# Patient Record
Sex: Female | Born: 1935 | Race: Black or African American | Hispanic: No | Smoking: Never smoker
Health system: Southern US, Community
[De-identification: ages and names within clinical notes are randomized; demographics above are authoritative.]

## PROBLEM LIST (undated history)

## (undated) DIAGNOSIS — E119 Type 2 diabetes mellitus without complications: Secondary | ICD-10-CM

## (undated) DIAGNOSIS — M1990 Unspecified osteoarthritis, unspecified site: Secondary | ICD-10-CM

## (undated) DIAGNOSIS — E669 Obesity, unspecified: Secondary | ICD-10-CM

## (undated) DIAGNOSIS — I10 Essential (primary) hypertension: Secondary | ICD-10-CM

## (undated) DIAGNOSIS — E559 Vitamin D deficiency, unspecified: Secondary | ICD-10-CM

## (undated) DIAGNOSIS — E785 Hyperlipidemia, unspecified: Secondary | ICD-10-CM

## (undated) DIAGNOSIS — R002 Palpitations: Secondary | ICD-10-CM

## (undated) DIAGNOSIS — R269 Unspecified abnormalities of gait and mobility: Secondary | ICD-10-CM

## (undated) HISTORY — DX: Vitamin D deficiency, unspecified: E55.9

## (undated) HISTORY — PX: ABDOMINAL HYSTERECTOMY: SHX81

## (undated) HISTORY — DX: Unspecified osteoarthritis, unspecified site: M19.90

## (undated) HISTORY — DX: Essential (primary) hypertension: I10

## (undated) HISTORY — DX: Hyperlipidemia, unspecified: E78.5

## (undated) HISTORY — DX: Type 2 diabetes mellitus without complications: E11.9

## (undated) HISTORY — DX: Palpitations: R00.2

## (undated) HISTORY — DX: Obesity, unspecified: E66.9

## (undated) HISTORY — DX: Unspecified abnormalities of gait and mobility: R26.9

## (undated) NOTE — *Deleted (*Deleted)
ED CM received consult from Dr Dalene Seltzer concerning patient has had 3 ED visits in the past months with multiple falls

---

## 1998-10-29 ENCOUNTER — Encounter: Payer: Self-pay | Admitting: Emergency Medicine

## 1998-10-30 ENCOUNTER — Inpatient Hospital Stay (HOSPITAL_COMMUNITY): Admission: EM | Admit: 1998-10-30 | Discharge: 1998-11-05 | Payer: Self-pay | Admitting: Emergency Medicine

## 1998-11-02 ENCOUNTER — Encounter: Payer: Self-pay | Admitting: Critical Care Medicine

## 1999-10-16 ENCOUNTER — Encounter: Payer: Self-pay | Admitting: Internal Medicine

## 1999-10-16 ENCOUNTER — Ambulatory Visit (HOSPITAL_COMMUNITY): Admission: RE | Admit: 1999-10-16 | Discharge: 1999-10-16 | Payer: Self-pay | Admitting: Internal Medicine

## 1999-10-16 ENCOUNTER — Encounter: Admission: RE | Admit: 1999-10-16 | Discharge: 1999-10-16 | Payer: Self-pay | Admitting: Internal Medicine

## 2002-05-07 ENCOUNTER — Encounter: Admission: RE | Admit: 2002-05-07 | Discharge: 2002-05-07 | Payer: Self-pay | Admitting: Internal Medicine

## 2002-05-07 ENCOUNTER — Encounter: Payer: Self-pay | Admitting: Internal Medicine

## 2004-07-09 ENCOUNTER — Inpatient Hospital Stay (HOSPITAL_COMMUNITY): Admission: AD | Admit: 2004-07-09 | Discharge: 2004-07-12 | Payer: Self-pay | Admitting: Internal Medicine

## 2005-10-10 ENCOUNTER — Emergency Department (HOSPITAL_COMMUNITY): Admission: EM | Admit: 2005-10-10 | Discharge: 2005-10-10 | Payer: Self-pay | Admitting: Emergency Medicine

## 2006-05-18 ENCOUNTER — Encounter: Admission: RE | Admit: 2006-05-18 | Discharge: 2006-05-18 | Payer: Self-pay | Admitting: Internal Medicine

## 2008-05-01 ENCOUNTER — Encounter: Admission: RE | Admit: 2008-05-01 | Discharge: 2008-05-01 | Payer: Self-pay | Admitting: Internal Medicine

## 2008-07-27 ENCOUNTER — Emergency Department (HOSPITAL_COMMUNITY): Admission: EM | Admit: 2008-07-27 | Discharge: 2008-07-27 | Payer: Self-pay | Admitting: Emergency Medicine

## 2009-06-10 ENCOUNTER — Encounter: Admission: RE | Admit: 2009-06-10 | Discharge: 2009-06-10 | Payer: Self-pay | Admitting: Internal Medicine

## 2010-05-31 ENCOUNTER — Encounter: Payer: Self-pay | Admitting: Internal Medicine

## 2010-07-02 ENCOUNTER — Other Ambulatory Visit: Payer: Self-pay | Admitting: Internal Medicine

## 2010-07-02 DIAGNOSIS — Z1231 Encounter for screening mammogram for malignant neoplasm of breast: Secondary | ICD-10-CM

## 2010-07-22 ENCOUNTER — Ambulatory Visit
Admission: RE | Admit: 2010-07-22 | Discharge: 2010-07-22 | Disposition: A | Discharge status: Home / Self Care | Payer: Medicare Other | Source: Ambulatory Visit | Attending: Internal Medicine | Admitting: Internal Medicine

## 2010-07-22 DIAGNOSIS — Z1231 Encounter for screening mammogram for malignant neoplasm of breast: Secondary | ICD-10-CM

## 2010-08-20 LAB — CBC
MCHC: 34.6 g/dL (ref 30.0–36.0)
MCV: 87.7 fL (ref 78.0–100.0)
Platelets: 251 10*3/uL (ref 150–400)
RBC: 3.68 MIL/uL — ABNORMAL LOW (ref 3.87–5.11)

## 2010-08-20 LAB — GLUCOSE, CAPILLARY: Glucose-Capillary: 227 mg/dL — ABNORMAL HIGH (ref 70–99)

## 2010-08-20 LAB — POCT I-STAT, CHEM 8
BUN: 45 mg/dL — ABNORMAL HIGH (ref 6–23)
Calcium, Ion: 1.18 mmol/L (ref 1.12–1.32)
Creatinine, Ser: 0.8 mg/dL (ref 0.4–1.2)
Hemoglobin: 11.9 g/dL — ABNORMAL LOW (ref 12.0–15.0)
Potassium: 3.6 mEq/L (ref 3.5–5.1)
TCO2: 25 mmol/L (ref 0–100)

## 2010-08-20 LAB — DIFFERENTIAL
Eosinophils Absolute: 0 10*3/uL (ref 0.0–0.7)
Eosinophils Relative: 0 % (ref 0–5)
Lymphocytes Relative: 9 % — ABNORMAL LOW (ref 12–46)
Neutro Abs: 6.4 10*3/uL (ref 1.7–7.7)
Neutrophils Relative %: 88 % — ABNORMAL HIGH (ref 43–77)

## 2010-08-20 LAB — URINALYSIS, ROUTINE W REFLEX MICROSCOPIC
Hgb urine dipstick: NEGATIVE
Ketones, ur: NEGATIVE mg/dL
Protein, ur: NEGATIVE mg/dL
Specific Gravity, Urine: 1.009 (ref 1.005–1.030)
Urobilinogen, UA: 0.2 mg/dL (ref 0.0–1.0)

## 2010-08-20 LAB — POCT CARDIAC MARKERS
CKMB, poc: 2.7 ng/mL (ref 1.0–8.0)
CKMB, poc: 3.4 ng/mL (ref 1.0–8.0)
Troponin i, poc: <0.05 ng/mL (ref 0.00–0.09)

## 2010-09-25 NOTE — Discharge Summary (Signed)
Carolyn Bishop, Carolyn Bishop                ACCOUNT NO.:  [id number]   MEDICAL RECORD NO.:  [id number]          PATIENT TYPE:  INP   LOCATION:  3731                         FACILITY:  [facility]   PHYSICIAN:  Lonia Blood, M.D.      DATE OF BIRTH:  12-Apr-1933   DATE OF ADMISSION:  07/09/2004  DATE OF DISCHARGE:  07/12/2004                                 DISCHARGE SUMMARY   PRIMARY CARE PHYSICIAN:  Robyn N. Allyne Gee, M.D.   DISCHARGE DIAGNOSES:  1.  Altered mental status changes.  2.  Uncontrolled diabetes.  3.  Chronic cough.  4.  Possible pneumonia.  5.  Hypertension.  6.  Mild insomnia.   DISCHARGE MEDICATIONS:  1.  Glipizide 10 mg p.o. b.i.d.  2.  Metformin 500 mg b.i.d.  3.  Maxzide 1 tablet q.d.  4.  Cozaar 50 mg q.d.  5.  Protonix 40 mg q.d.  6.  Celebrex 200 mg q.d.   FOLLOW UP:  The patient is to follow up with Dr. Candyce Churn. Sanders in one to  two weeks.   DISPOSITION:  The patient is currently in good health, not confused any  more. Her sister lives with her and husband. Instructed to observe the  patient closely, and if any other episode of confusion recurs, she will need  further evaluation preferably in case of dementia.   PROCEDURE:  1.  Head CT without contrast performed on July 10, 2004 showed mild atrophy      and small vessel disease without acute intracranial abnormality or      abnormal pulse control enhancement.  2.  Chest x-ray performed on July 10, 2004 essentially will not exclude      early basal infiltrate. Just mildly increased markings on both sides.  3.  MRI/MRA of the brain performed on July 11, 2004: This shows no acute      intracranial abnormality. Acute left maxillary sinus changes. Two mild      intracranial atherosclerotic changes with no significant stenosis. The      patient has a 9 by 30 mm polypoid lesion arising from the flow of the      right side of the sphenoid sinus, otherwise normal with an empty sellar.   CONSULTATIONS:  None.   BRIEF  HISTORY:  The patient is a 77 year old African American female with  longstanding hypertension, diabetes, and history of fibroids. The patient  was admitted on July 09, 2004 secondary to acute onset of altered mental  status changes. The patient was admitted directly with blood pressure of  150/64. CBGs as high as 300. She was seen by Dr. Candyce Churn. Sanders in her  office and was not in distress at the time. She was also noted to be alert  at the time of exam. However, she has had episodes of going in and out of  confusion. A sister, who lives with her, was worried that the patient was  getting confused repeatedly which is not her normal baseline. She was  subsequently sent over to the hospital for further workup. Her labs on  admission  showed a normal UA. Also CBC was essentially normal with white  count of 8, hemoglobin 12.4, and platelets 282,000. Her electrolytes also  were normal except for some sugar of 169 and a BUN of 24.   HOSPITAL COURSE:  Problem 1:  ALTERED MENTAL STATUS: This was thought to be  a case of acute delirium. The patient was for the most part alert but she  had a couple of episodes where she went into some elements of confusion.  Because of her history of diabetes, hypertension, and problems with  dyslipidemia, the patient was thought to be at high risk for CVA. Other  possible causes were probably dementia. However, her vascular causes were as  such and the following tests were ordered. Head CT was ordered as indicated  above, which was essentially negative. Cardiac enzymes were also checked to  rule out myocardial infarction as a possible causes. TSH was also normal at  1.012. Vitamin B12 was also checked and it was normal at 899. RPR was  checked and it was nonreactive. Her sedimentation rate was mildly elevated  at 53. An ammonia level was essentially normal.   The patient's urinalysis was also repeated and was essentially normal. She  was admitted into telemetry  and observed closely and her mental status  continued to change for the better throughout hospitalization. At the time  of discharge, the patient is essentially back to her baseline. At this  point, it is possible that the patient may have had the altered mental  status changes as a form of acute delirium due to acute disease. There is a  possibility that she may have had a pneumonia and the patient was  appropriately started on Avelox, which she has had for three days now.  Pneumonia is treated for at least five to seven days. Her sister has been  notified on patient that if confusion returns then consideration for  probably some elements of early onset dementia needs to be done and  appropriate treatment initiated.   Problem 2:  DIABETES:  The patient's sugars were continuously between 200  and 300. Apparently, they had gone as high as 500 at home recently. She was  on Glipizide alone before which has been changed to twice a day prior to  this admission. Despite taking Glipizide 10 mg b.i.d. in the hospital, her  sugars have been running from the 250s to 300s. Subsequently, we have  covered that with sliding scale insulin but at discharge we are putting the  patient on Metformin. I will start her on 500 mg b.i.d. to be adjusted as an  outpatient by Dr. Candyce Churn. Sanders. The patient has a meter at home to check  her sugars and her sister is there to help. She has two other sisters who  are also diabetic. So, the patient will be checking her CBGs at least three  times a day and then if the sugar is not appropriately controlled she will  let Dr. Candyce Churn. Allyne Gee know for further management.   Problem 3:  CHRONIC COUGH:  The patient has a dry, chronic cough that has  been going on for a while. She also described GERD-like symptoms with some  heartburn. Essentially, differential diagnoses include possibly ACE induced especially since she was taking Monopril. It could also be due to GERD.  We  initiated the patient on a PPI, Protonix daily and we switched half of  Monopril to an ARB at a course of 50 mg  daily. The patient's cough has  already virtually resolved now. We will therefore keep her on these  medicines for now.   Problem 4:  HYPERTENSION:  Her blood pressure is well controlled both on the  ACE and now on the ARB.   Problem 5:  INSOMNIA:  The patient has some bouts of insomnia in the  hospital secondary to where she was unable to sleep. We treated that with  some Ambien. However, she was not having problems at home. If that recurs,  she should call Dr. Candyce Churn. Sanders to see if she will be restarted on some  Ambien or other sleep medications.      LG/MEDQ  D:  07/12/2004  T:  07/13/2004  Job:  [id number]   cc:   Candyce Churn. Allyne Gee, M.D.  [address]  [address]  [place]  [place] [address]  Fax: [phone]

## 2011-01-28 ENCOUNTER — Encounter (INDEPENDENT_AMBULATORY_CARE_PROVIDER_SITE_OTHER): Payer: Medicare Other | Admitting: Ophthalmology

## 2011-01-28 DIAGNOSIS — E11359 Type 2 diabetes mellitus with proliferative diabetic retinopathy without macular edema: Secondary | ICD-10-CM

## 2011-01-28 DIAGNOSIS — H3581 Retinal edema: Secondary | ICD-10-CM

## 2011-01-28 DIAGNOSIS — H43819 Vitreous degeneration, unspecified eye: Secondary | ICD-10-CM

## 2011-02-11 ENCOUNTER — Encounter (INDEPENDENT_AMBULATORY_CARE_PROVIDER_SITE_OTHER): Payer: Medicare Other | Admitting: Ophthalmology

## 2011-02-11 DIAGNOSIS — H3581 Retinal edema: Secondary | ICD-10-CM

## 2011-02-24 ENCOUNTER — Encounter (INDEPENDENT_AMBULATORY_CARE_PROVIDER_SITE_OTHER): Payer: Medicare Other | Admitting: Ophthalmology

## 2011-02-24 DIAGNOSIS — H3581 Retinal edema: Secondary | ICD-10-CM

## 2011-03-17 ENCOUNTER — Encounter (INDEPENDENT_AMBULATORY_CARE_PROVIDER_SITE_OTHER): Payer: Medicare Other | Admitting: Ophthalmology

## 2011-03-17 DIAGNOSIS — E11359 Type 2 diabetes mellitus with proliferative diabetic retinopathy without macular edema: Secondary | ICD-10-CM

## 2011-03-17 DIAGNOSIS — E1165 Type 2 diabetes mellitus with hyperglycemia: Secondary | ICD-10-CM

## 2011-03-17 DIAGNOSIS — E1139 Type 2 diabetes mellitus with other diabetic ophthalmic complication: Secondary | ICD-10-CM

## 2011-03-31 ENCOUNTER — Encounter (INDEPENDENT_AMBULATORY_CARE_PROVIDER_SITE_OTHER): Payer: Medicare Other | Admitting: Ophthalmology

## 2011-03-31 DIAGNOSIS — E11359 Type 2 diabetes mellitus with proliferative diabetic retinopathy without macular edema: Secondary | ICD-10-CM

## 2011-03-31 DIAGNOSIS — E1139 Type 2 diabetes mellitus with other diabetic ophthalmic complication: Secondary | ICD-10-CM

## 2011-07-30 ENCOUNTER — Ambulatory Visit (INDEPENDENT_AMBULATORY_CARE_PROVIDER_SITE_OTHER): Payer: Medicare Other | Admitting: Ophthalmology

## 2011-08-09 ENCOUNTER — Other Ambulatory Visit: Payer: Self-pay | Admitting: Internal Medicine

## 2011-08-09 DIAGNOSIS — Z1231 Encounter for screening mammogram for malignant neoplasm of breast: Secondary | ICD-10-CM

## 2011-08-30 ENCOUNTER — Ambulatory Visit (INDEPENDENT_AMBULATORY_CARE_PROVIDER_SITE_OTHER): Payer: Medicare HMO | Admitting: Ophthalmology

## 2011-08-30 DIAGNOSIS — E11359 Type 2 diabetes mellitus with proliferative diabetic retinopathy without macular edema: Secondary | ICD-10-CM

## 2011-08-30 DIAGNOSIS — H43819 Vitreous degeneration, unspecified eye: Secondary | ICD-10-CM

## 2011-08-30 DIAGNOSIS — I10 Essential (primary) hypertension: Secondary | ICD-10-CM

## 2011-08-30 DIAGNOSIS — H35039 Hypertensive retinopathy, unspecified eye: Secondary | ICD-10-CM

## 2011-08-30 DIAGNOSIS — H2510 Age-related nuclear cataract, unspecified eye: Secondary | ICD-10-CM

## 2011-08-30 DIAGNOSIS — E1165 Type 2 diabetes mellitus with hyperglycemia: Secondary | ICD-10-CM

## 2011-09-14 ENCOUNTER — Ambulatory Visit
Admission: RE | Admit: 2011-09-14 | Discharge: 2011-09-14 | Disposition: A | Discharge status: Home / Self Care | Payer: Medicare HMO | Source: Ambulatory Visit | Attending: Internal Medicine | Admitting: Internal Medicine

## 2011-09-14 DIAGNOSIS — Z1231 Encounter for screening mammogram for malignant neoplasm of breast: Secondary | ICD-10-CM

## 2012-03-01 ENCOUNTER — Ambulatory Visit (INDEPENDENT_AMBULATORY_CARE_PROVIDER_SITE_OTHER): Payer: Medicare HMO | Admitting: Ophthalmology

## 2014-09-18 ENCOUNTER — Encounter: Payer: Self-pay | Admitting: *Deleted

## 2015-02-21 ENCOUNTER — Emergency Department (HOSPITAL_COMMUNITY)
Admission: EM | Admit: 2015-02-21 | Discharge: 2015-02-22 | Disposition: A | Discharge status: Home / Self Care | Payer: Medicare HMO | Attending: Emergency Medicine | Admitting: Emergency Medicine

## 2015-02-21 ENCOUNTER — Emergency Department (HOSPITAL_COMMUNITY): Payer: Medicare HMO

## 2015-02-21 ENCOUNTER — Encounter (HOSPITAL_COMMUNITY): Payer: Self-pay | Admitting: Nurse Practitioner

## 2015-02-21 DIAGNOSIS — Z79899 Other long term (current) drug therapy: Secondary | ICD-10-CM | POA: Insufficient documentation

## 2015-02-21 DIAGNOSIS — E119 Type 2 diabetes mellitus without complications: Secondary | ICD-10-CM | POA: Diagnosis not present

## 2015-02-21 DIAGNOSIS — R1110 Vomiting, unspecified: Secondary | ICD-10-CM | POA: Diagnosis not present

## 2015-02-21 DIAGNOSIS — R1031 Right lower quadrant pain: Secondary | ICD-10-CM | POA: Insufficient documentation

## 2015-02-21 DIAGNOSIS — R911 Solitary pulmonary nodule: Secondary | ICD-10-CM | POA: Diagnosis not present

## 2015-02-21 DIAGNOSIS — I10 Essential (primary) hypertension: Secondary | ICD-10-CM | POA: Diagnosis not present

## 2015-02-21 DIAGNOSIS — R109 Unspecified abdominal pain: Secondary | ICD-10-CM

## 2015-02-21 LAB — COMPREHENSIVE METABOLIC PANEL
ALT: 14 U/L (ref 14–54)
AST: 27 U/L (ref 15–41)
Albumin: 3.6 g/dL (ref 3.5–5.0)
Alkaline Phosphatase: 51 U/L (ref 38–126)
Anion gap: 14 (ref 5–15)
BUN: 44 mg/dL — ABNORMAL HIGH (ref 6–20)
CO2: 27 mmol/L (ref 22–32)
Calcium: 9.8 mg/dL (ref 8.9–10.3)
Chloride: 93 mmol/L — ABNORMAL LOW (ref 101–111)
Creatinine, Ser: 1 mg/dL (ref 0.44–1.00)
GFR calc Af Amer: >60 mL/min (ref >60)
GFR calc non Af Amer: 52 mL/min — ABNORMAL LOW (ref >60)
Glucose, Bld: 197 mg/dL — ABNORMAL HIGH (ref 65–99)
Potassium: 3.4 mmol/L — ABNORMAL LOW (ref 3.5–5.1)
Sodium: 134 mmol/L — ABNORMAL LOW (ref 135–145)
Total Bilirubin: 0.7 mg/dL (ref 0.3–1.2)
Total Protein: 7.3 g/dL (ref 6.5–8.1)

## 2015-02-21 LAB — CBC
HCT: 31.5 % — ABNORMAL LOW (ref 36.0–46.0)
Hemoglobin: 10.6 g/dL — ABNORMAL LOW (ref 12.0–15.0)
MCH: 29 pg (ref 26.0–34.0)
MCHC: 33.7 g/dL (ref 30.0–36.0)
MCV: 86.1 fL (ref 78.0–100.0)
Platelets: 251 10*3/uL (ref 150–400)
RBC: 3.66 MIL/uL — ABNORMAL LOW (ref 3.87–5.11)
RDW: 12.8 % (ref 11.5–15.5)
WBC: 5.8 10*3/uL (ref 4.0–10.5)

## 2015-02-21 MED ORDER — ONDANSETRON HCL 4 MG/2ML IJ SOLN
4.0000 mg | Freq: Once | INTRAMUSCULAR | Status: AC
  Administered 2015-02-21: 4 mg via INTRAVENOUS
  Filled 2015-02-21: qty 2

## 2015-02-21 MED ORDER — KETOROLAC TROMETHAMINE 30 MG/ML IJ SOLN
30.0000 mg | Freq: Once | INTRAMUSCULAR | Status: AC
  Administered 2015-02-21: 30 mg via INTRAVENOUS
  Filled 2015-02-21: qty 1

## 2015-02-21 MED ORDER — LACTATED RINGERS IV BOLUS (SEPSIS)
1000.0000 mL | Freq: Once | INTRAVENOUS | Status: AC
  Administered 2015-02-21: 1000 mL via INTRAVENOUS

## 2015-02-21 NOTE — ED Provider Notes (Signed)
CSN: [id number]     Arrival date & time 02/21/15  1824 History   First MD Initiated Contact with Patient 02/21/15 2210     Chief Complaint  Patient presents with  . Abdominal Pain     (Consider location/radiation/quality/duration/timing/severity/associated sxs/prior Treatment) HPI Comments: 81 year old female with past medical history including hypertension, diabetes, hyperlipidemia who presents with right flank pain. The patient states that she has been waking up with some lower blood sugars over the past several nights and last night woke up and noticed right flank pain radiating into her right side. The pain has been constant, moderate in intensity, and becomes severe with movement. She began having vomiting this morning and has had multiple episodes today. She reports that the pain begins in her right flank and radiates to her side and does not begin in her abdomen. She had constipation earlier this week but took laxatives and now has normal bowel movements. No pain with urination. No fevers, cough/cold symptoms, chest pain, or shortness of breath. No diarrhea.  Patient is a 81 y.o. female presenting with abdominal pain. The history is provided by the patient.  Abdominal Pain   Past Medical History  Diagnosis Date  . Diabetes (HCC)   . HLD (hyperlipidemia)   . HTN (hypertension)   . Palpitations    History reviewed. No pertinent past surgical history. History reviewed. No pertinent family history. Social History  Substance Use Topics  . Smoking status: Never Smoker   . Smokeless tobacco: Never Used  . Alcohol Use: No   OB History    No data available     Review of Systems  Gastrointestinal: Positive for abdominal pain.   10 Systems reviewed and are negative for acute change except as noted in the HPI.    Allergies  Review of patient's allergies indicates no known allergies.  Home Medications   Prior to Admission medications   Medication Sig Start Date End Date  Taking? Authorizing Provider  acetaminophen (TYLENOL) 500 MG tablet Take 1,000 mg by mouth daily as needed (pain).   Yes Historical Provider, MD  Calcium Carb-Cholecalciferol (CALCIUM 600 + D PO) Take 1,200 mg by mouth daily.   Yes Historical Provider, MD  furosemide (LASIX) 40 MG tablet Take 40 mg by mouth daily.   Yes Historical Provider, MD  glipiZIDE (GLUCOTROL) 5 MG tablet Take 5 mg by mouth 2 (two) times daily before a meal.   Yes Historical Provider, MD  losartan-hydrochlorothiazide (HYZAAR) 100-25 MG tablet Take 1 tablet by mouth daily.   Yes Historical Provider, MD  metFORMIN (GLUCOPHAGE) 500 MG tablet Take 500 mg by mouth 2 (two) times daily with a meal.   Yes Historical Provider, MD  metoprolol succinate (TOPROL-XL) 25 MG 24 hr tablet Take 25 mg by mouth daily before supper.   Yes Historical Provider, MD  Multiple Vitamin (MULTIVITAMIN WITH MINERALS) TABS tablet Take 1 tablet by mouth daily.   Yes Historical Provider, MD  naproxen sodium (ALEVE) 220 MG tablet Take 220 mg by mouth daily as needed (pain).   Yes Historical Provider, MD  potassium chloride SA (K-DUR,KLOR-CON) 20 MEQ tablet Take 20 mEq by mouth daily.   Yes Historical Provider, MD  senna-docusate (SENOKOT S) 8.6-50 MG tablet Take 1 tablet by mouth 2 (two) times daily as needed for mild constipation.   Yes Historical Provider, MD   BP 131/58 mmHg  Pulse 67  Temp(Src) 98.3 F (36.8 C) (Oral)  Resp 16  SpO2 96% Physical Exam  Constitutional: She  is oriented to person, place, and time. She appears well-developed and well-nourished. No distress.  HENT:  Head: Normocephalic and atraumatic.  Mouth/Throat: Oropharynx is clear and moist.  Moist mucous membranes  Eyes: Conjunctivae are normal. Pupils are equal, round, and reactive to light.  Neck: Neck supple.  Cardiovascular: Normal rate, regular rhythm and normal heart sounds.   No murmur heard. Pulmonary/Chest: Effort normal and breath sounds normal.  Abdominal: Soft.  Bowel sounds are normal. She exhibits no distension. There is no tenderness.  Genitourinary:  R CVA tenderness  Musculoskeletal: She exhibits no edema.  Neurological: She is alert and oriented to person, place, and time.  Fluent speech  Skin: Skin is warm and dry.  Psychiatric: She has a normal mood and affect. Judgment normal.  Nursing note and vitals reviewed.   ED Course  Procedures (including critical care time) Labs Review Labs Reviewed  COMPREHENSIVE METABOLIC PANEL - Abnormal; Notable for the following:    Sodium 134 (*)    Potassium 3.4 (*)    Chloride 93 (*)    Glucose, Bld 197 (*)    BUN 44 (*)    GFR calc non Af Amer 52 (*)    All other components within normal limits  CBC - Abnormal; Notable for the following:    RBC 3.66 (*)    Hemoglobin 10.6 (*)    HCT 31.5 (*)    All other components within normal limits  URINALYSIS, ROUTINE W REFLEX MICROSCOPIC (NOT AT [facility]) - Abnormal; Notable for the following:    APPearance CLOUDY (*)    All other components within normal limits    Imaging Review Ct Abdomen Pelvis Wo Contrast  02/21/2015  CLINICAL DATA:  Right flank pain, nausea, vomiting, constipation EXAM: CT ABDOMEN AND PELVIS WITHOUT CONTRAST TECHNIQUE: Multidetector CT imaging of the abdomen and pelvis was performed following the standard protocol without IV contrast. COMPARISON:  None. FINDINGS: Sagittal images of the spine shows degenerative changes lumbar spine. Lung bases shows a pleural based low-density nodule in right base centrally measures 1.3 cm. There is dextroscoliosis of the lumbar spine. Atherosclerotic calcifications of abdominal aorta and iliac arteries. Small hiatal hernia. Moderate stool noted throughout the colon. Normal appendix. No pericecal inflammation. There is a supraumbilical midline ventral hernia containing fat without evidence of acute complication measures 2.7 cm. Atherosclerotic calcifications of abdominal aorta and iliac arteries. No aortic  aneurysm. The terminal ileum is unremarkable. No small bowel obstruction. No ascites or free air. No adenopathy. Urinary bladder is unremarkable. The uterus is not identified. Unenhanced kidneys are symmetrical in size. There is no nephrolithiasis. No hydronephrosis or hydroureter. There is a hyperdense cortical lesion midpole of the left kidney measures 1.2 cm. Further correlation with enhanced CT or MRI is recommended. No calcified calculi are noted within urinary bladder. Bilateral distal ureter is unremarkable. IMPRESSION: 1. There is a central diaphragmatic based nodule in left lower lobe measures 1.3 cm. Further correlation with enhanced CT scan of the chest is recommended. 2. Small hiatal hernia. 3. No nephrolithiasis.  No hydronephrosis or hydroureter. 4. Degenerative changes lumbar spine. 5. Moderate stool throughout the colon. Normal appendix. No pericecal inflammation. 6. Midline supraumbilical ventral hernia containing fat without evidence of acute complication. 7. No calcified ureteral calculi. No calcified calculi are noted within urinary bladder. 8. No small bowel obstruction. Electronically Signed   By: Natasha Mead M.D.   On: 02/21/2015 23:34   I have personally reviewed and evaluated these lab results as part of my  medical decision-making.   EKG Interpretation None     Medications  lactated ringers bolus 1,000 mL (1,000 mLs Intravenous New Bag/Given 02/21/15 2248)  ondansetron (ZOFRAN) injection 4 mg (4 mg Intravenous Given 02/21/15 2248)  ketorolac (TORADOL) 30 MG/ML injection 30 mg (30 mg Intravenous Given 02/21/15 2342)    MDM   Final diagnoses:  Right flank pain  Lung nodule seen on imaging study    81 year old female who presents with 1 day of right flank radiating into right side pain associated with several episodes of vomiting. Patient well-appearing at presentation with normal vital signs. No right lower quadrant tenderness on exam. She did have right CVA tenderness.  Obtained above labs which showed mild hyperglycemia but were otherwise unremarkable. Stable anemia with hemoglobin 10.6. Because of the patient's flank pain associated with vomiting, obtained a CT of the abdomen and pelvis to evaluate for renal stone or other acute process. CT showed no acute findings to explain the patient's symptoms. She had moderate stool burden and left lower lobe nodule with CT of chest recommended. I have informed the patient of this incidental finding and emphasized importance of follow-up with PCP for further imaging. Patient has voiced understanding of follow-up plan as well as return precautions. Patient discharged in satisfactory condition.  Laurence Spatesachel Morgan Calyse [place], MD 02/22/15 [phone]

## 2015-02-21 NOTE — ED Notes (Signed)
She c/o waking past several night with low blood sugar, last night when she woke she noticed RLQ abd pain radiating into R flank. Pain has persisted since. Then onset vomiting since waking this am. she reports some constipation earlier this week that was relieved after taking a laxative.

## 2015-02-22 LAB — URINALYSIS, ROUTINE W REFLEX MICROSCOPIC
BILIRUBIN URINE: NEGATIVE
Glucose, UA: NEGATIVE mg/dL
HGB URINE DIPSTICK: NEGATIVE
KETONES UR: NEGATIVE mg/dL
Leukocytes, UA: NEGATIVE
Nitrite: NEGATIVE
Protein, ur: NEGATIVE mg/dL
SPECIFIC GRAVITY, URINE: 1.014 (ref 1.005–1.030)
UROBILINOGEN UA: 0.2 mg/dL (ref 0.0–1.0)
pH: 7 (ref 5.0–8.0)

## 2015-02-22 NOTE — Discharge Instructions (Signed)
PLEASE FOLLOW UP WITH YOUR PRIMARY CARE PROVIDER TO HAVE A CONTRASTED CT SCAN OF YOUR CHEST TO EVALUATE A LEFT LUNG NODULE. Flank Pain Flank pain refers to pain that is located on the side of the body between the upper abdomen and the back. The pain may occur over a short period of time (acute) or may be long-term or reoccurring (chronic). It may be mild or severe. Flank pain can be caused by many things. CAUSES  Some of the more common causes of flank pain include:  Muscle strains.   Muscle spasms.   A disease of your spine (vertebral disk disease).   A lung infection (pneumonia).   Fluid around your lungs (pulmonary edema).   A kidney infection.   Kidney stones.   A very painful skin rash caused by the chickenpox virus (shingles).   Gallbladder disease.  HOME CARE INSTRUCTIONS  Home care will depend on the cause of your pain. In general,  Rest as directed by your caregiver.  Drink enough fluids to keep your urine clear or pale yellow.  Only take over-the-counter or prescription medicines as directed by your caregiver. Some medicines may help relieve the pain.  Tell your caregiver about any changes in your pain.  Follow up with your caregiver as directed. SEEK IMMEDIATE MEDICAL CARE IF:   Your pain is not controlled with medicine.   You have new or worsening symptoms.  Your pain increases.   You have abdominal pain.   You have shortness of breath.   You have persistent nausea or vomiting.   You have swelling in your abdomen.   You feel faint or pass out.   You have blood in your urine.  You have a fever or persistent symptoms for more than 2-3 days.  You have a fever and your symptoms suddenly get worse. MAKE SURE YOU:   Understand these instructions.  Will watch your condition.  Will get help right away if you are not doing well or get worse.   This information is not intended to replace advice given to you by your health care  provider. Make sure you discuss any questions you have with your health care provider.   Document Released: 06/17/2005 Document Revised: 01/19/2012 Document Reviewed: 12/09/2011 Elsevier Interactive Patient Education [employer]2016 [employer]

## 2016-08-17 LAB — CBC AND DIFFERENTIAL
HCT: 32 — AB (ref 36–46)
Hemoglobin: 10.2 — AB (ref 12.0–16.0)
Platelets: 238 (ref 150–399)
WBC: 5.3

## 2016-08-17 LAB — HEPATIC FUNCTION PANEL
ALT: 9 (ref 7–35)
AST: 17 (ref 13–35)
Alkaline Phosphatase: 80 (ref 25–125)
Bilirubin, Total: 0.3

## 2016-08-17 LAB — TSH: TSH: 0.77 (ref 0.41–5.90)

## 2016-08-17 LAB — BASIC METABOLIC PANEL
BUN: 54 — AB (ref 4–21)
CO2: 26 — AB (ref 13–22)
Chloride: 97 — AB (ref 99–108)
Creatinine: 1 (ref 0.5–1.1)
Glucose: 70
Potassium: 3.6 (ref 3.4–5.3)
Sodium: 141 (ref 137–147)

## 2016-08-17 LAB — LIPID PANEL
Cholesterol: 155 (ref 0–200)
HDL: 47 (ref 35–70)
LDL Cholesterol: 93
Triglycerides: 76 (ref 40–160)

## 2016-08-17 LAB — COMPREHENSIVE METABOLIC PANEL
Albumin: 4.1 (ref 3.5–5.0)
Calcium: 10 (ref 8.7–10.7)
GFR calc Af Amer: 63
GFR calc non Af Amer: 55
Globulin: 3.4

## 2016-08-17 LAB — CBC: RBC: 3.63 — AB (ref 3.87–5.11)

## 2016-08-17 LAB — HEMOGLOBIN A1C: Hemoglobin A1C: 5.5

## 2016-08-17 LAB — VITAMIN D 25 HYDROXY (VIT D DEFICIENCY, FRACTURES): Vit D, 25-Hydroxy: 52.5

## 2016-08-17 LAB — MICROALBUMIN, URINE: Microalb, Ur: ABNORMAL

## 2016-08-23 ENCOUNTER — Other Ambulatory Visit: Payer: Self-pay | Admitting: Internal Medicine

## 2016-08-23 DIAGNOSIS — Z1231 Encounter for screening mammogram for malignant neoplasm of breast: Secondary | ICD-10-CM

## 2017-10-27 ENCOUNTER — Emergency Department (HOSPITAL_BASED_OUTPATIENT_CLINIC_OR_DEPARTMENT_OTHER): Payer: Medicare HMO

## 2017-10-27 ENCOUNTER — Other Ambulatory Visit: Payer: Self-pay

## 2017-10-27 ENCOUNTER — Encounter: Payer: Self-pay | Admitting: Emergency Medicine

## 2017-10-27 ENCOUNTER — Inpatient Hospital Stay (HOSPITAL_BASED_OUTPATIENT_CLINIC_OR_DEPARTMENT_OTHER)
Admission: EM | Admit: 2017-10-27 | Discharge: 2017-10-31 | DRG: 682 | Disposition: A | Discharge status: Home / Self Care | Payer: Medicare HMO | Attending: Family Medicine | Admitting: Family Medicine

## 2017-10-27 DIAGNOSIS — R911 Solitary pulmonary nodule: Secondary | ICD-10-CM | POA: Diagnosis present

## 2017-10-27 DIAGNOSIS — R339 Retention of urine, unspecified: Secondary | ICD-10-CM | POA: Diagnosis present

## 2017-10-27 DIAGNOSIS — E872 Acidosis, unspecified: Secondary | ICD-10-CM | POA: Diagnosis present

## 2017-10-27 DIAGNOSIS — K254 Chronic or unspecified gastric ulcer with hemorrhage: Secondary | ICD-10-CM | POA: Diagnosis present

## 2017-10-27 DIAGNOSIS — K922 Gastrointestinal hemorrhage, unspecified: Secondary | ICD-10-CM | POA: Diagnosis not present

## 2017-10-27 DIAGNOSIS — E46 Unspecified protein-calorie malnutrition: Secondary | ICD-10-CM | POA: Diagnosis present

## 2017-10-27 DIAGNOSIS — E785 Hyperlipidemia, unspecified: Secondary | ICD-10-CM | POA: Diagnosis present

## 2017-10-27 DIAGNOSIS — N179 Acute kidney failure, unspecified: Principal | ICD-10-CM | POA: Diagnosis present

## 2017-10-27 DIAGNOSIS — I959 Hypotension, unspecified: Secondary | ICD-10-CM

## 2017-10-27 DIAGNOSIS — Z90710 Acquired absence of both cervix and uterus: Secondary | ICD-10-CM

## 2017-10-27 DIAGNOSIS — I10 Essential (primary) hypertension: Secondary | ICD-10-CM | POA: Diagnosis present

## 2017-10-27 DIAGNOSIS — R29898 Other symptoms and signs involving the musculoskeletal system: Secondary | ICD-10-CM

## 2017-10-27 DIAGNOSIS — R918 Other nonspecific abnormal finding of lung field: Secondary | ICD-10-CM | POA: Diagnosis present

## 2017-10-27 DIAGNOSIS — Z79899 Other long term (current) drug therapy: Secondary | ICD-10-CM | POA: Diagnosis not present

## 2017-10-27 DIAGNOSIS — Z6821 Body mass index (BMI) 21.0-21.9, adult: Secondary | ICD-10-CM

## 2017-10-27 DIAGNOSIS — R571 Hypovolemic shock: Secondary | ICD-10-CM | POA: Diagnosis present

## 2017-10-27 DIAGNOSIS — R338 Other retention of urine: Secondary | ICD-10-CM

## 2017-10-27 DIAGNOSIS — I5030 Unspecified diastolic (congestive) heart failure: Secondary | ICD-10-CM | POA: Diagnosis not present

## 2017-10-27 DIAGNOSIS — Z7984 Long term (current) use of oral hypoglycemic drugs: Secondary | ICD-10-CM

## 2017-10-27 DIAGNOSIS — E162 Hypoglycemia, unspecified: Secondary | ICD-10-CM | POA: Diagnosis present

## 2017-10-27 DIAGNOSIS — E119 Type 2 diabetes mellitus without complications: Secondary | ICD-10-CM | POA: Diagnosis not present

## 2017-10-27 DIAGNOSIS — E11649 Type 2 diabetes mellitus with hypoglycemia without coma: Secondary | ICD-10-CM | POA: Diagnosis present

## 2017-10-27 DIAGNOSIS — E1151 Type 2 diabetes mellitus with diabetic peripheral angiopathy without gangrene: Secondary | ICD-10-CM | POA: Diagnosis present

## 2017-10-27 DIAGNOSIS — G9340 Encephalopathy, unspecified: Secondary | ICD-10-CM | POA: Diagnosis not present

## 2017-10-27 DIAGNOSIS — D649 Anemia, unspecified: Secondary | ICD-10-CM | POA: Diagnosis not present

## 2017-10-27 DIAGNOSIS — R7303 Prediabetes: Secondary | ICD-10-CM

## 2017-10-27 LAB — URINALYSIS, ROUTINE W REFLEX MICROSCOPIC
BILIRUBIN URINE: NEGATIVE
Glucose, UA: NEGATIVE mg/dL
Hgb urine dipstick: NEGATIVE
KETONES UR: NEGATIVE mg/dL
LEUKOCYTES UA: NEGATIVE
Nitrite: NEGATIVE
PROTEIN: NEGATIVE mg/dL
Specific Gravity, Urine: 1.01 (ref 1.005–1.030)
pH: 5.5 (ref 5.0–8.0)

## 2017-10-27 LAB — COMPREHENSIVE METABOLIC PANEL
ALK PHOS: 45 U/L (ref 38–126)
ALT: 14 U/L (ref 14–54)
ANION GAP: 15 (ref 5–15)
AST: 27 U/L (ref 15–41)
Albumin: 4 g/dL (ref 3.5–5.0)
BILIRUBIN TOTAL: 0.3 mg/dL (ref 0.3–1.2)
BUN: 162 mg/dL — ABNORMAL HIGH (ref 6–20)
CALCIUM: 9.7 mg/dL (ref 8.9–10.3)
CO2: 26 mmol/L (ref 22–32)
CREATININE: 3.79 mg/dL — AB (ref 0.44–1.00)
Chloride: 93 mmol/L — ABNORMAL LOW (ref 101–111)
GFR, EST AFRICAN AMERICAN: 12 mL/min — AB (ref >60)
GFR, EST NON AFRICAN AMERICAN: 10 mL/min — AB (ref >60)
Glucose, Bld: 38 mg/dL — CL (ref 65–99)
Potassium: 4.7 mmol/L (ref 3.5–5.1)
SODIUM: 134 mmol/L — AB (ref 135–145)
TOTAL PROTEIN: 7.8 g/dL (ref 6.5–8.1)

## 2017-10-27 LAB — IRON AND TIBC
IRON: 51 ug/dL (ref 28–170)
SATURATION RATIOS: 15 % (ref 10.4–31.8)
TIBC: 337 ug/dL (ref 250–450)
UIBC: 286 ug/dL

## 2017-10-27 LAB — CBC
HCT: 25.9 % — ABNORMAL LOW (ref 36.0–46.0)
HEMOGLOBIN: 8.8 g/dL — AB (ref 12.0–15.0)
MCH: 30.6 pg (ref 26.0–34.0)
MCHC: 34 g/dL (ref 30.0–36.0)
MCV: 89.9 fL (ref 78.0–100.0)
Platelets: 216 10*3/uL (ref 150–400)
RBC: 2.88 MIL/uL — ABNORMAL LOW (ref 3.87–5.11)
RDW: 12.3 % (ref 11.5–15.5)
WBC: 4.6 10*3/uL (ref 4.0–10.5)

## 2017-10-27 LAB — CBG MONITORING, ED
GLUCOSE-CAPILLARY: 42 mg/dL — AB (ref 65–99)
Glucose-Capillary: 138 mg/dL — ABNORMAL HIGH (ref 65–99)
Glucose-Capillary: 27 mg/dL — CL (ref 65–99)
Glucose-Capillary: 79 mg/dL (ref 65–99)

## 2017-10-27 LAB — FERRITIN: FERRITIN: 69 ng/mL (ref 11–307)

## 2017-10-27 LAB — RETICULOCYTES
RBC.: 2.96 MIL/uL — ABNORMAL LOW (ref 3.87–5.11)
RETIC CT PCT: 1.1 % (ref 0.4–3.1)
Retic Count, Absolute: 32.6 10*3/uL (ref 19.0–186.0)

## 2017-10-27 LAB — VITAMIN B12: Vitamin B-12: 295 pg/mL (ref 180–914)

## 2017-10-27 LAB — I-STAT CG4 LACTIC ACID, ED
Lactic Acid, Venous: 1.81 mmol/L (ref 0.5–1.9)
Lactic Acid, Venous: 2.22 mmol/L (ref 0.5–1.9)

## 2017-10-27 LAB — FOLATE: Folate: 13 ng/mL (ref >5.9)

## 2017-10-27 LAB — OCCULT BLOOD X 1 CARD TO LAB, STOOL: FECAL OCCULT BLD: POSITIVE — AB

## 2017-10-27 MED ORDER — DEXTROSE 50 % IV SOLN: 50.0000 mL | INTRAVENOUS | Status: DC | PRN

## 2017-10-27 MED ORDER — SODIUM CHLORIDE 0.9 % IV BOLUS
1000.0000 mL | Freq: Once | INTRAVENOUS | Status: AC
  Administered 2017-10-27: 1000 mL via INTRAVENOUS

## 2017-10-27 MED ORDER — DEXTROSE 10 % IV SOLN
INTRAVENOUS | Status: DC
  Administered 2017-10-27: 23:00:00 via INTRAVENOUS

## 2017-10-27 MED ORDER — DEXTROSE 50 % IV SOLN
1.0000 | Freq: Once | INTRAVENOUS | Status: AC
  Administered 2017-10-27: 50 mL via INTRAVENOUS

## 2017-10-27 MED ORDER — DEXTROSE 50 % IV SOLN
INTRAVENOUS | Status: AC
  Filled 2017-10-27: qty 50

## 2017-10-27 MED ORDER — HYDROCORTISONE NA SUCCINATE PF 100 MG IJ SOLR
100.0000 mg | Freq: Once | INTRAMUSCULAR | Status: AC
  Administered 2017-10-27: 100 mg via INTRAVENOUS
  Filled 2017-10-27: qty 2

## 2017-10-27 MED ORDER — DEXTROSE 50 % IV SOLN
INTRAVENOUS | Status: AC
Start: 2017-10-27 — End: 2017-10-28
  Filled 2017-10-27: qty 50

## 2017-10-27 NOTE — ED Notes (Signed)
Pt on monitor 

## 2017-10-27 NOTE — ED Notes (Addendum)
Patient transported to X-ray 

## 2017-10-27 NOTE — ED Notes (Signed)
[facility] arrived to transport pt 

## 2017-10-27 NOTE — H&P (Signed)
History and Physical    Carolyn Bishop ZOX:[id number] DOB: [birth year] DOA: 10/27/2017  Referring MD/NP/PA:   PCP: Andi Devon, MD   Patient coming from:  The patient is coming from home.  At baseline, pt is independent for most of ADL.  Chief Complaint: Generalized weakness and intermittent left arm weakness  HPI: Carolyn Bishop is a 84 y.o. female with medical history significant of hypertension, hyperlipidemia, diabetes mellitus, who presents with generalized weakness and intermittent left arm weakness.  Patient states that she has been having generalized weakness in the past 3 days.  She has decreased energy and poor appetite.  She also reports intermittent left arm weakness, but no tingling in extremities.  No facial droop or slurred speech.  Patient denies any chest pain, shortness of breath, cough.  No fever or chills.  Denies nausea, vomiting, diarrhea, abdominal pain.  No symptoms of UTI.  No dark stool or hematuria.  She has mild suprapubic abdominal pain, found to have 600 ml of urinary retention on bladder scan ED.  Foley catheter is placed.  ED Course: pt was found to have positive FOBT, hemoglobin 8.8 which was 10.6 on 02/21/2015, AKI with creatinine 3.79, BUN was 62, negative urinalysis, hypoglycemia with blood sugar 38 which is treated with D50 and D10 infusion.  Temperature normal, no tachycardia, no tachypnea, oxygen saturation 100% on room air.  Chest x-ray has no infiltration, but showed increased size of left lower lobe mass.  Patient is admitted to stepdown as inpatient.  Review of Systems:   General: no fevers, chills, no body weight gain, has poor appetite, has fatigue HEENT: no blurry vision, hearing changes or sore throat Respiratory: no dyspnea, coughing, wheezing CV: no chest pain, no palpitations GI: no nausea, vomiting, had mild suprapubic abdominal pain, diarrhea, constipation GU: no dysuria, burning on urination, increased urinary frequency, hematuria  Ext:  no leg edema Neuro: has left arm weakness, no facial droop or slurred speech Skin: no rash, no skin tear. MSK: No muscle spasm, no deformity, no limitation of range of movement in spin Heme: No easy bruising.  Travel history: No recent long distant travel.  Allergy: No Known Allergies  Past Medical History:  Diagnosis Date  . Diabetes (HCC)   . HLD (hyperlipidemia)   . HTN (hypertension)   . Palpitations     Past Surgical History:  Procedure Laterality Date  . ABDOMINAL HYSTERECTOMY      Social History:  reports that she has never smoked. She has never used smokeless tobacco. She reports that she does not drink alcohol or use drugs.  Family History:  Family History  Problem Relation Age of Onset  . Hypertension Mother   . Lung cancer Father      Prior to Admission medications   Medication Sig Start Date End Date Taking? Authorizing Provider  acetaminophen (TYLENOL) 500 MG tablet Take 1,000 mg by mouth daily as needed (pain).    [provider]  Calcium Carb-Cholecalciferol (CALCIUM 600 + D PO) Take 1,200 mg by mouth daily.    [provider]  furosemide (LASIX) 40 MG tablet Take 40 mg by mouth daily.    [provider]  glipiZIDE (GLUCOTROL) 5 MG tablet Take 5 mg by mouth 2 (two) times daily before a meal.    [provider]  losartan-hydrochlorothiazide (HYZAAR) 100-25 MG tablet Take 1 tablet by mouth daily.    [provider]  metFORMIN (GLUCOPHAGE) 500 MG tablet Take 500 mg by mouth 2 (two) times daily  with a meal.    [provider]  metoprolol succinate (TOPROL-XL) 25 MG 24 hr tablet Take 25 mg by mouth daily before supper.    [provider]  Multiple Vitamin (MULTIVITAMIN WITH MINERALS) TABS tablet Take 1 tablet by mouth daily.    [provider]  naproxen sodium (ALEVE) 220 MG tablet Take 220 mg by mouth daily as needed (pain).    [provider]  potassium chloride SA (K-DUR,KLOR-CON)  20 MEQ tablet Take 20 mEq by mouth daily.    [provider]  senna-docusate (SENOKOT S) 8.6-50 MG tablet Take 1 tablet by mouth 2 (two) times daily as needed for mild constipation.    [provider]    Physical Exam: Vitals:   10/27/17 2215 10/27/17 2234 10/27/17 2240 10/28/17 0000  BP: (!) 104/50  (!) 113/53   Pulse: 87 88 87 86  Resp: 19 (!) 21 20 14   Temp:      TempSrc:      SpO2: 100% 100% 96% 100%  Weight:      Height:       General: Not in acute distress HEENT:       Eyes: PERRL, EOMI, no scleral icterus.       ENT: No discharge from the ears and nose, no pharynx injection, no tonsillar enlargement.        Neck: No JVD, no bruit, no mass felt. Heme: No neck lymph node enlargement. Cardiac: S1/S2, RRR, No murmurs, No gallops or rubs. Respiratory: No rales, wheezing, rhonchi or rubs. GI: Soft, nondistended, nontender, no rebound pain, no organomegaly, BS present. GU: No hematuria Ext: No pitting leg edema bilaterally. 2+DP/PT pulse bilaterally. Musculoskeletal: No joint deformities, No joint redness or warmth, no limitation of ROM in spin. Skin: No rashes.  Neuro: Alert, oriented X3, cranial nerves II-XII grossly intact, moves all extremities normally. Muscle strength 5/5 in all extremities, sensation to light touch intact. Brachial reflex 2+ bilaterally. Negative Babinski's sign. Normal finger to nose test. Psych: Patient is not psychotic, no suicidal or hemocidal ideation.  Labs on Admission: I have personally reviewed following labs and imaging studies  CBC: Recent Labs  Lab 10/27/17 1755  WBC 4.6  HGB 8.8*  HCT 25.9*  MCV 89.9  PLT 216   Basic Metabolic Panel: Recent Labs  Lab 10/27/17 1755  NA 134*  K 4.7  CL 93*  CO2 26  GLUCOSE 38*  BUN 162*  CREATININE 3.79*  CALCIUM 9.7   GFR: Estimated Creatinine Clearance: 9.3 mL/min (A) (by C-G formula based on SCr of 3.79 mg/dL (H)). Liver Function Tests: Recent Labs  Lab 10/27/17 1755   AST 27  ALT 14  ALKPHOS 45  BILITOT 0.3  PROT 7.8  ALBUMIN 4.0   No results for input(s): LIPASE, AMYLASE in the last 168 hours. No results for input(s): AMMONIA in the last 168 hours. Coagulation Profile: No results for input(s): INR, PROTIME in the last 168 hours. Cardiac Enzymes: No results for input(s): CKTOTAL, CKMB, CKMBINDEX, TROPONINI in the last 168 hours. BNP (last 3 results) No results for input(s): PROBNP in the last 8760 hours. HbA1C: No results for input(s): HGBA1C in the last 72 hours. CBG: Recent Labs  Lab 10/27/17 1806 10/27/17 1828 10/27/17 1941 10/27/17 2226  GLUCAP 27* 138* 79 42*   Lipid Profile: No results for input(s): CHOL, HDL, LDLCALC, TRIG, CHOLHDL, LDLDIRECT in the last 72 hours. Thyroid Function Tests: No results for input(s): TSH, T4TOTAL, FREET4, T3FREE, THYROIDAB in the  last 72 hours. Anemia Panel: Recent Labs    10/27/17 1755 10/27/17 2010  VITAMINB12  --  295  FOLATE  --  13.0  FERRITIN  --  69  TIBC  --  337  IRON  --  51  RETICCTPCT 1.1  --    Urine analysis:    Component Value Date/Time   COLORURINE YELLOW 10/27/2017 1755   APPEARANCEUR CLEAR 10/27/2017 1755   LABSPEC 1.010 10/27/2017 1755   PHURINE 5.5 10/27/2017 1755   GLUCOSEU NEGATIVE 10/27/2017 1755   HGBUR NEGATIVE 10/27/2017 1755   BILIRUBINUR NEGATIVE 10/27/2017 1755   KETONESUR NEGATIVE 10/27/2017 1755   PROTEINUR NEGATIVE 10/27/2017 1755   UROBILINOGEN 0.2 02/21/2015 2355   NITRITE NEGATIVE 10/27/2017 1755   LEUKOCYTESUR NEGATIVE 10/27/2017 1755   Sepsis Labs: @LABRCNTIP (procalcitonin:4,lacticidven:4) )No results found for this or any previous visit (from the past 240 hour(s)).   Radiological Exams on Admission: Dg Chest 2 View  Result Date: 10/27/2017 CLINICAL DATA:  Pt niece reports pt with generalized weakness x 1 week, mostly in the mornings. Niece states "she just hasn't been herself this week" pt is awake and alert, oriented x 4, denies any pain or  other c/o at this time. EXAM: CHEST - 2 VIEW COMPARISON:  07/27/2008, abdominal CT on 02/21/2015 FINDINGS: Heart size is normal. The lungs are free of focal consolidations. A persistent mass is identified at the LEFT lung base, estimated to be 15 millimeters in diameter. IMPRESSION: No pulmonary edema or consolidation. Increased size of LEFT LOWER lobe nodule. Further evaluation is recommended with CT of the chest. Electronically Signed   By: Norva PavlovElizabeth  Brown M.D.   On: 10/27/2017 19:19     EKG: Independently reviewed. Sinus rhythm, QTC 426, low voltage, early R wave progression, nonspecific T wave change.     Assessment/Plan Principal Problem:   AKI (acute kidney injury) (HCC) Active Problems:   Hypotension   Diabetes mellitus without complication (HCC)   Essential hypertension   Hypoglycemia   Normocytic anemia   GIB (gastrointestinal bleeding)   Lung mass   Lactic acid acidosis   Left arm weakness   HLD (hyperlipidemia)   Acute urinary retention   AKI (acute kidney injury) (HCC): Likely multifactorial etiology, including urinary retention, prerenal failure secondary to dehydration and continuation of Hyzarr and naproxen and possible ATN given hypotension.  - will admit to SDU as inpt - IVF: 2L ns bolus, then D5-1/2 NS at 100 cc/h - Follow up renal function by BMP - Check FeUrea  - Hold hzarr and naproxen - f/u renal US - Foley catheter is placed for urinary retention  Hypotension: Etiology is not clear.  Differential diagnosis included dehydration, adrenal insufficiency, occult infection and sepsis.  Patient does not have fever or leukocytosis, no obvious source of infection.  Urinalysis negative.  Chest x-ray did not show infiltration. -pt was Solu-Cortef 100 mg -check cortisol level -IV fluid as above -Blood culture, urine culture  Lactic acid acidosis: Possibly due to dehydration and hypoperfusion secondary to hypotension.  But also need to rule out occult infection and  sepsis. -will get Procalcitonin and trend lactic acid levels per sepsis protocol. -IVF: as above -f/u Bx and Ux  Normocytic anemia and GIB: pt has positive FOBT, indicating possible mild GI bleeding.  Hemoglobin dropped from 10.6 on 02/21/15 to 8.8.  Patient states that she did not have colonoscopy or EGD in the past. - NPO  - IVF - Start IV pantoprazole 40 mg bid - Zofran IV for  nausea - Avoid NSAIDs and SQ heparin - Maintain IV access (2 large bore IVs if possible). - Monitor closely and follow q6h cbc, transfuse as necessary, if Hgb<7.0 - LaB: INR, PTT and type screen  Diabetes mellitus without complication (HCC): Last A1c not on record. Patient is taking metformin and glipizide at home.  Patient is hypotensive on admission.  -hold glipizide and metformin -check CBG q1h -on D5-1/2 NS at 100 cch -prn D50  Hypoglycemia: Possibly due to decreased oral intake and continuation of glipizide and metformin -hold glipizide and metformin -r/o occult infection and sepsis as above -Received 100 mg of Solu-Cortef -f/u cortisol level to rule out adrenal insufficiency  Essential hypertension: -hold Lasix, Hyzaar, metoprolol due to hypotension -IV hydralazine.  HLD: pt is not taking meds at home -f/u FLP  Lung mass: CXr showed increased size of LLL nodule, estimated to be 15 millimeters in diameter. CXR on 06/29/08 showed 13.6 mm opacity left lung base just above the left hemidiaphragm and been present previously, whcih may represent a granuloma per radiologist report. Pt has family history of lung cancer (father died of lung cancer) -f/u with PCP and may need to give referral to oncology.  Left arm weakness: Patient states that she has been having intermittent left arm weakness, but on physical examination, she does not have weakness on the left arm currently -MRI for brain to rule out stroke -check A1c and FLP -no ASA due to GIB   DVT ppx: scd Code Status: Full code Family  Communication: None at bed side.   Disposition Plan:  Anticipate discharge back to previous home environment Consults called:  NONE Admission status: SDU/inpation       Date of Service 10/28/2017    Lorretta Harp [facility] Pager [phone]  If 7PM-7AM, please contact night-coverage [email or web address] Password [facility] 10/28/2017, 12:32 AM

## 2017-10-27 NOTE — ED Notes (Addendum)
Alert, NAD, calm, interactive, resps e/u, speaking in clear complete sentences, no dyspnea noted, skin W&D, VSS. BC x2 obtained. Pt drinking OJ and eating PB & crackers[facility]

## 2017-10-27 NOTE — Care Management (Signed)
This is a no charge note   [facility]  84 year old lady with past medical history of hypertension, hyperlipidemia, diabetes mellitus, who presents with generalized weakness. No fever or chills.  No chest pain.  Patient has poor oral intake recently.  Patient was found to have hypotension with blood pressure 88/47, which improved to 119/50 after 2 liters of normal saline bolus; hypoglycemia with blood sugar 38, which is treated with D50; acute renal injury with creatinine 3.79 (creatinine 1.0 on 02/21/2015), BUN 162.  Patient has urinary retention with 500 urine in bladder, Foley catheter is placed seated.  Pt was found to have WBC 4.6, hemoglobin 8.8 which was 10.6 on 02/21/2015, positive FOBT, lactic acid 2.22, negative urinalysis, temperature normal, no tachycardia, no tachypnea, oxygen saturation 100% on room air.  Chest x-ray is negative for infiltration, but showed increased size of left lower lobe nodule.  Patient will be given a dose of Solu-Cortef.  Patient is admitted to stepdown as inpatient.   Please call manager of [facility] at [phone] when pt arrives to floor   Lorretta HarpXilin Azad Calame, MD  [facility] Pager [phone]  If 7PM-7AM, please contact night-coverage [email or web address] Password [email or web address] 10/27/2017, 9:27 PM

## 2017-10-27 NOTE — ED Provider Notes (Signed)
[facility] EMERGENCY DEPARTMENT Provider Note   CSN: [id number] Arrival date & time: 10/27/17  1736     History   Chief Complaint Chief Complaint  Patient presents with  . Weakness    HPI Carolyn Bishop is a 84 y.o. female.  HPI 84 year old female with a history of diabetes hypertension hyperlipidemia presents to the emergency department generalized weakness over the past 3 days.  She states she has had decreased energy.  Denies fevers and chills.  Patient denies cough and chest pain.  No shortness of breath.  Denies abdominal pain.  No low back pain.  Denies dysuria.  Reports she is taken all of her medications at home including her blood pressure medications and her diabetes medications.  She did not eat anything today.  She has been drinking fluids.  She states she feels weak.  She denies nausea vomiting diarrhea.  Denies blood in her stool or black-colored stool.  No new rash.  Denies headache.  Family reports no confusion.  Denies neck pain or stiffness in severity.  No history of blood transfusions   Past Medical History:  Diagnosis Date  . Diabetes (HCC)   . HLD (hyperlipidemia)   . HTN (hypertension)   . Palpitations     There are no active problems to display for this patient.   History reviewed. No pertinent surgical history.   OB History   None      Home Medications    Prior to Admission medications   Medication Sig Start Date End Date Taking? Authorizing Provider  acetaminophen (TYLENOL) 500 MG tablet Take 1,000 mg by mouth daily as needed (pain).    [provider]  Calcium Carb-Cholecalciferol (CALCIUM 600 + D PO) Take 1,200 mg by mouth daily.    [provider]  furosemide (LASIX) 40 MG tablet Take 40 mg by mouth daily.    [provider]  glipiZIDE (GLUCOTROL) 5 MG tablet Take 5 mg by mouth 2 (two) times daily before a meal.    [provider]  losartan-hydrochlorothiazide (HYZAAR) 100-25 MG tablet Take 1  tablet by mouth daily.    [provider]  metFORMIN (GLUCOPHAGE) 500 MG tablet Take 500 mg by mouth 2 (two) times daily with a meal.    [provider]  metoprolol succinate (TOPROL-XL) 25 MG 24 hr tablet Take 25 mg by mouth daily before supper.    [provider]  Multiple Vitamin (MULTIVITAMIN WITH MINERALS) TABS tablet Take 1 tablet by mouth daily.    [provider]  naproxen sodium (ALEVE) 220 MG tablet Take 220 mg by mouth daily as needed (pain).    [provider]  potassium chloride SA (K-DUR,KLOR-CON) 20 MEQ tablet Take 20 mEq by mouth daily.    [provider]  senna-docusate (SENOKOT S) 8.6-50 MG tablet Take 1 tablet by mouth 2 (two) times daily as needed for mild constipation.    [provider]    Family History History reviewed. No pertinent family history.  Social History Social History   Tobacco Use  . Smoking status: Never Smoker  . Smokeless tobacco: Never Used  Substance Use Topics  . Alcohol use: No    Alcohol/week: 0.0 oz  . Drug use: No     Allergies   Patient has no known allergies.   Review of Systems Review of Systems  All other systems reviewed and are negative.    Physical Exam Updated Vital Signs BP 92/71   Pulse 88  Temp 98.5 F (36.9 C) (Oral)   Resp 15   Ht 5\' 3"  (1.6 m)   Wt 54.4 kg (120 lb)   SpO2 100%   BMI 21.26 kg/m   Physical Exam  Constitutional: She is oriented to person, place, and time. She appears well-developed and well-nourished. No distress.  HENT:  Head: Normocephalic and atraumatic.  Eyes: EOM are normal.  Neck: Normal range of motion. Neck supple.  Cardiovascular: Normal rate, regular rhythm and normal heart sounds.  Pulmonary/Chest: Effort normal and breath sounds normal.  Abdominal: Soft. She exhibits no distension. There is no tenderness.  Genitourinary:  Genitourinary Comments: No gross blood on rectal examination.  Stool is brown in color.   Chaperone present.  Musculoskeletal: Normal range of motion.  Neurological: She is alert and oriented to person, place, and time.  Skin: Skin is warm and dry. There is pallor.  Psychiatric: She has a normal mood and affect. Judgment normal.  Nursing note and vitals reviewed.    ED Treatments / Results  Labs (all labs ordered are listed, but only abnormal results are displayed) Labs Reviewed  CBC - Abnormal; Notable for the following components:      Result Value   RBC 2.88 (*)    Hemoglobin 8.8 (*)    HCT 25.9 (*)    All other components within normal limits  COMPREHENSIVE METABOLIC PANEL - Abnormal; Notable for the following components:   Sodium 134 (*)    Chloride 93 (*)    Glucose, Bld 38 (*)    BUN 162 (*)    Creatinine, Ser 3.79 (*)    GFR calc non Af Amer 10 (*)    GFR calc Af Amer 12 (*)    All other components within normal limits  OCCULT BLOOD X 1 CARD TO LAB, STOOL - Abnormal; Notable for the following components:   Fecal Occult Bld POSITIVE (*)    All other components within normal limits  CBG MONITORING, ED - Abnormal; Notable for the following components:   Glucose-Capillary 27 (*)    All other components within normal limits  CBG MONITORING, ED - Abnormal; Notable for the following components:   Glucose-Capillary 138 (*)    All other components within normal limits  I-STAT CG4 LACTIC ACID, ED - Abnormal; Notable for the following components:   Lactic Acid, Venous 2.22 (*)    All other components within normal limits  URINALYSIS, ROUTINE W REFLEX MICROSCOPIC  VITAMIN B12  FOLATE  IRON AND TIBC  FERRITIN  RETICULOCYTES  I-STAT CG4 LACTIC ACID, ED  CBG MONITORING, ED    BUN  Date Value Ref Range Status  10/27/2017 162 (H) 6 - 20 mg/dL Final    Comment:    RESULTS CONFIRMED BY MANUAL DILUTION  02/21/2015 44 (H) 6 - 20 mg/dL Final  11/91/478203/20/2010 45 (H) 6 - 23 mg/dL Final   Creatinine, Ser  Date Value Ref Range Status  10/27/2017 3.79 (H) 0.44 - 1.00  mg/dL Final  95/62/130810/14/2016 6.571.00 0.44 - 1.00 mg/dL Final  84/69/629503/20/2010 0.8 0.4 - 1.2 mg/dL Final    Hemoglobin  Date Value Ref Range Status  10/27/2017 8.8 (L) 12.0 - 15.0 g/dL Final  28/41/324410/14/2016 01.010.6 (L) 12.0 - 15.0 g/dL Final  27/25/366403/20/2010 40.311.9 (L) 12.0 - 15.0 g/dL Final  47/42/595603/20/2010 38.711.2 (L) 12.0 - 15.0 g/dL Final    EKG EKG Interpretation  Date/Time:  Thursday October 27 2017 18:10:03 EDT Ventricular Rate:  90 PR Interval:    QRS Duration:  99 QT Interval:  348 QTC Calculation: 426 R Axis:   52 Text Interpretation:  Sinus rhythm Prolonged PR interval Low voltage, extremity leads No significant change was found Confirmed by Azalia Bilis ([id number]) on 10/27/2017 8:47:39 PM   Radiology Dg Chest 2 View  Result Date: 10/27/2017 CLINICAL DATA:  Pt niece reports pt with generalized weakness x 1 week, mostly in the mornings. Niece states "she just hasn't been herself this week" pt is awake and alert, oriented x 4, denies any pain or other c/o at this time. EXAM: CHEST - 2 VIEW COMPARISON:  07/27/2008, abdominal CT on 02/21/2015 FINDINGS: Heart size is normal. The lungs are free of focal consolidations. A persistent mass is identified at the LEFT lung base, estimated to be 15 millimeters in diameter. IMPRESSION: No pulmonary edema or consolidation. Increased size of LEFT LOWER lobe nodule. Further evaluation is recommended with CT of the chest. Electronically Signed   By: Norva Pavlov M.D.   On: 10/27/2017 19:19    Procedures Procedures (including critical care time)  Medications Ordered in ED Medications  dextrose 50 % solution 50 mL (50 mLs Intravenous Given 10/27/17 1813)  sodium chloride 0.9 % bolus 1,000 mL (0 mLs Intravenous Stopped 10/27/17 1958)  sodium chloride 0.9 % bolus 1,000 mL (1,000 mLs Intravenous New Bag/Given 10/27/17 2006)     Initial Impression / Assessment and Plan / ED Course  I have reviewed the triage vital signs and the nursing notes.  Pertinent labs & imaging  results that were available during my care of the patient were reviewed by me and considered in my medical decision making (see chart for details).     Patient found to be hypoglycemic on arrival to the emergency department.  Patient treated with dextrose and offered food as she did not eat anything today.  Patient found to be in acute renal failure with a BUN of 162 and a creatinine 3.79.  These are both significantly elevated from her baseline.  Patient with a hemoglobin of 8.8 with a baseline hemoglobin of 10-11.  Brown stool on rectal examination.  Trace Hemoccult positive.  Patient found to be in acute urinary retention which may be the cause of her uremia and elevated creatinine.  This was treated with indwelling Foley catheter which will be left.  Patient is drained nearly 600 cc of urine at this time.  Transient hypotension.  Afebrile.  Responded to IV fluids.  Lactate mildly elevated at 2.2.  Patient will be admitted to hospital for hypoglycemia, transient hypotension, acute renal failure, worsening anemia.  Anemia panel sent.  Blood cultures will be ordered. Final Clinical Impressions(s) / ED Diagnoses   Final diagnoses:  Acute renal failure, unspecified acute renal failure type (HCC)  Anemia, unspecified type  Acute urinary retention  Hypoglycemia  Hypotension, unspecified hypotension type    ED Discharge Orders    None       Azalia Bilis, MD 10/27/17 2050

## 2017-10-27 NOTE — Progress Notes (Addendum)
Lactic Acid ISTAT done and critical value was given of 2.22. RBV with Dr. Patria Maneampos at 2020 by Eloise LevelsFrank Azlyn Wingler Jr. RRT,RCP on 10/27/2017.

## 2017-10-27 NOTE — ED Triage Notes (Signed)
Pt niece reports pt with generalized weakness x 1 week, mostly in the mornings. Niece states "she just hasn't been herself this week" pt is awake and alert, oriented x 4, denies any pain or other c/o at this time.

## 2017-10-27 NOTE — ED Notes (Signed)
MD Campus and RN Madelaine Bhatdam were informed of CBG reading of 27.

## 2017-10-27 NOTE — ED Notes (Signed)
Patient transported to X-ray 

## 2017-10-27 NOTE — ED Notes (Addendum)
Glucose 38, given to ED MD. Blood drawn prior to receiving amp of D50

## 2017-10-27 NOTE — ED Notes (Signed)
EDP at bedside aware of pt's V/S

## 2017-10-28 ENCOUNTER — Other Ambulatory Visit: Payer: Self-pay

## 2017-10-28 ENCOUNTER — Inpatient Hospital Stay (HOSPITAL_COMMUNITY): Payer: Medicare HMO

## 2017-10-28 ENCOUNTER — Encounter (HOSPITAL_COMMUNITY): Payer: Self-pay | Admitting: Internal Medicine

## 2017-10-28 DIAGNOSIS — R338 Other retention of urine: Secondary | ICD-10-CM | POA: Diagnosis present

## 2017-10-28 DIAGNOSIS — N179 Acute kidney failure, unspecified: Principal | ICD-10-CM

## 2017-10-28 DIAGNOSIS — R571 Hypovolemic shock: Secondary | ICD-10-CM

## 2017-10-28 DIAGNOSIS — E785 Hyperlipidemia, unspecified: Secondary | ICD-10-CM | POA: Diagnosis present

## 2017-10-28 DIAGNOSIS — I5030 Unspecified diastolic (congestive) heart failure: Secondary | ICD-10-CM

## 2017-10-28 LAB — BASIC METABOLIC PANEL
ANION GAP: 13 (ref 5–15)
ANION GAP: 10 (ref 5–15)
BUN: 131 mg/dL — ABNORMAL HIGH (ref 6–20)
BUN: 132 mg/dL — ABNORMAL HIGH (ref 6–20)
CALCIUM: 8.8 mg/dL — AB (ref 8.9–10.3)
CALCIUM: 8.8 mg/dL — AB (ref 8.9–10.3)
CO2: 23 mmol/L (ref 22–32)
CO2: 25 mmol/L (ref 22–32)
CREATININE: 3.16 mg/dL — AB (ref 0.44–1.00)
Chloride: 101 mmol/L (ref 101–111)
Chloride: 104 mmol/L (ref 101–111)
Creatinine, Ser: 2.98 mg/dL — ABNORMAL HIGH (ref 0.44–1.00)
GFR calc non Af Amer: 13 mL/min — ABNORMAL LOW (ref >60)
GFR, EST AFRICAN AMERICAN: 15 mL/min — AB (ref >60)
GFR, EST AFRICAN AMERICAN: 16 mL/min — AB (ref >60)
GFR, EST NON AFRICAN AMERICAN: 14 mL/min — AB (ref >60)
GLUCOSE: 342 mg/dL — AB (ref 65–99)
Glucose, Bld: 268 mg/dL — ABNORMAL HIGH (ref 65–99)
Potassium: 4.5 mmol/L (ref 3.5–5.1)
Potassium: 4.6 mmol/L (ref 3.5–5.1)
SODIUM: 139 mmol/L (ref 135–145)
Sodium: 137 mmol/L (ref 135–145)

## 2017-10-28 LAB — CBC
HCT: 23.2 % — ABNORMAL LOW (ref 36.0–46.0)
HCT: 23.3 % — ABNORMAL LOW (ref 36.0–46.0)
HCT: 25 % — ABNORMAL LOW (ref 36.0–46.0)
HCT: 25.4 % — ABNORMAL LOW (ref 36.0–46.0)
HEMATOCRIT: 23.5 % — AB (ref 36.0–46.0)
HEMOGLOBIN: 7.7 g/dL — AB (ref 12.0–15.0)
HEMOGLOBIN: 7.8 g/dL — AB (ref 12.0–15.0)
HEMOGLOBIN: 8 g/dL — AB (ref 12.0–15.0)
HEMOGLOBIN: 8.2 g/dL — AB (ref 12.0–15.0)
HEMOGLOBIN: 8.3 g/dL — AB (ref 12.0–15.0)
MCH: 29.6 pg (ref 26.0–34.0)
MCH: 30 pg (ref 26.0–34.0)
MCH: 30.1 pg (ref 26.0–34.0)
MCH: 30.4 pg (ref 26.0–34.0)
MCH: 30.8 pg (ref 26.0–34.0)
MCHC: 32.7 g/dL (ref 30.0–36.0)
MCHC: 32.8 g/dL (ref 30.0–36.0)
MCHC: 33.2 g/dL (ref 30.0–36.0)
MCHC: 33.5 g/dL (ref 30.0–36.0)
MCHC: 34 g/dL (ref 30.0–36.0)
MCV: 90.4 fL (ref 78.0–100.0)
MCV: 90.6 fL (ref 78.0–100.0)
MCV: 90.7 fL (ref 78.0–100.0)
MCV: 90.7 fL (ref 78.0–100.0)
MCV: 91.6 fL (ref 78.0–100.0)
PLATELETS: 183 10*3/uL (ref 150–400)
PLATELETS: 197 10*3/uL (ref 150–400)
PLATELETS: 196 10*3/uL (ref 150–400)
Platelets: 185 10*3/uL (ref 150–400)
Platelets: 194 10*3/uL (ref 150–400)
RBC: 2.56 MIL/uL — ABNORMAL LOW (ref 3.87–5.11)
RBC: 2.57 MIL/uL — AB (ref 3.87–5.11)
RBC: 2.6 MIL/uL — ABNORMAL LOW (ref 3.87–5.11)
RBC: 2.73 MIL/uL — AB (ref 3.87–5.11)
RBC: 2.8 MIL/uL — AB (ref 3.87–5.11)
RDW: 12.7 % (ref 11.5–15.5)
RDW: 12.7 % (ref 11.5–15.5)
RDW: 12.7 % (ref 11.5–15.5)
RDW: 12.8 % (ref 11.5–15.5)
RDW: 12.8 % (ref 11.5–15.5)
WBC: 4 10*3/uL (ref 4.0–10.5)
WBC: 5 10*3/uL (ref 4.0–10.5)
WBC: 5.2 10*3/uL (ref 4.0–10.5)
WBC: 5.3 10*3/uL (ref 4.0–10.5)
WBC: 6.3 10*3/uL (ref 4.0–10.5)

## 2017-10-28 LAB — BLOOD GAS, ARTERIAL
ACID-BASE DEFICIT: 1.6 mmol/L (ref 0.0–2.0)
BICARBONATE: 22.1 mmol/L (ref 20.0–28.0)
Drawn by: 422461
FIO2: 21
O2 Saturation: 97.6 %
PCO2 ART: 34.4 mmHg (ref 32.0–48.0)
PH ART: 7.421 (ref 7.350–7.450)
PO2 ART: 85.5 mmHg (ref 83.0–108.0)
Patient temperature: 98.1

## 2017-10-28 LAB — GLUCOSE, CAPILLARY
GLUCOSE-CAPILLARY: 153 mg/dL — AB (ref 65–99)
GLUCOSE-CAPILLARY: 227 mg/dL — AB (ref 65–99)
GLUCOSE-CAPILLARY: 259 mg/dL — AB (ref 65–99)
Glucose-Capillary: 137 mg/dL — ABNORMAL HIGH (ref 65–99)
Glucose-Capillary: 343 mg/dL — ABNORMAL HIGH (ref 65–99)
Glucose-Capillary: 288 mg/dL — ABNORMAL HIGH (ref 65–99)
Glucose-Capillary: 266 mg/dL — ABNORMAL HIGH (ref 65–99)
Glucose-Capillary: 299 mg/dL — ABNORMAL HIGH (ref 65–99)
Glucose-Capillary: 160 mg/dL — ABNORMAL HIGH (ref 65–99)

## 2017-10-28 LAB — PROTIME-INR
INR: 0.97
Prothrombin Time: 12.8 seconds (ref 11.4–15.2)

## 2017-10-28 LAB — MRSA PCR SCREENING: MRSA BY PCR: NEGATIVE

## 2017-10-28 LAB — LIPID PANEL
CHOL/HDL RATIO: 4.9 ratio
Cholesterol: 165 mg/dL (ref 0–200)
HDL: 34 mg/dL — AB (ref >40)
LDL Cholesterol: 90 mg/dL (ref 0–99)
Triglycerides: 206 mg/dL — ABNORMAL HIGH (ref <150)
VLDL: 41 mg/dL — ABNORMAL HIGH (ref 0–40)

## 2017-10-28 LAB — HEMOGLOBIN A1C
Hgb A1c MFr Bld: 4.8 % (ref 4.8–5.6)
Mean Plasma Glucose: 91.06 mg/dL

## 2017-10-28 LAB — CORTISOL-AM, BLOOD: Cortisol - AM: 58 ug/dL — ABNORMAL HIGH (ref 6.7–22.6)

## 2017-10-28 LAB — PROCALCITONIN: PROCALCITONIN: 0.17 ng/mL

## 2017-10-28 LAB — CORTISOL: Cortisol, Plasma: 38.1 ug/dL

## 2017-10-28 LAB — LACTIC ACID, PLASMA
Lactic Acid, Venous: 3.5 mmol/L (ref 0.5–1.9)
Lactic Acid, Venous: 2.5 mmol/L (ref 0.5–1.9)

## 2017-10-28 LAB — ABO/RH: ABO/RH(D): AB POS

## 2017-10-28 LAB — TROPONIN I: Troponin I: <0.03 ng/mL (ref <0.03)

## 2017-10-28 LAB — ECHOCARDIOGRAM COMPLETE
HEIGHTINCHES: 63 in
WEIGHTICAEL: 1920 [oz_av]

## 2017-10-28 LAB — APTT: APTT: 21 s — AB (ref 24–36)

## 2017-10-28 MED ORDER — INSULIN ASPART 100 UNIT/ML ~~LOC~~ SOLN
0.0000 [IU] | Freq: Three times a day (TID) | SUBCUTANEOUS | Status: DC
  Administered 2017-10-28: 5 [IU] via SUBCUTANEOUS
  Administered 2017-10-29: 2 [IU] via SUBCUTANEOUS
  Administered 2017-10-29: 3 [IU] via SUBCUTANEOUS
  Administered 2017-10-30: 1 [IU] via SUBCUTANEOUS
  Administered 2017-10-30: 7 [IU] via SUBCUTANEOUS
  Administered 2017-10-31: 3 [IU] via SUBCUTANEOUS
  Administered 2017-10-31: 1 [IU] via SUBCUTANEOUS

## 2017-10-28 MED ORDER — ACETAMINOPHEN 325 MG PO TABS: 650.0000 mg | ORAL_TABLET | Freq: Every day | ORAL | Status: DC | PRN

## 2017-10-28 MED ORDER — ZOLPIDEM TARTRATE 5 MG PO TABS: 5.0000 mg | ORAL_TABLET | Freq: Every evening | ORAL | Status: DC | PRN

## 2017-10-28 MED ORDER — SENNOSIDES-DOCUSATE SODIUM 8.6-50 MG PO TABS
1.0000 | ORAL_TABLET | Freq: Two times a day (BID) | ORAL | Status: DC | PRN
  Filled 2017-10-28: qty 1

## 2017-10-28 MED ORDER — INSULIN ASPART 100 UNIT/ML ~~LOC~~ SOLN
7.0000 [IU] | Freq: Once | SUBCUTANEOUS | Status: AC
  Administered 2017-10-28: 7 [IU] via SUBCUTANEOUS

## 2017-10-28 MED ORDER — HYDRALAZINE HCL 20 MG/ML IJ SOLN: 5.0000 mg | INTRAMUSCULAR | Status: DC | PRN

## 2017-10-28 MED ORDER — POLYETHYLENE GLYCOL 3350 17 G PO PACK: 17.0000 g | PACK | Freq: Every day | ORAL | Status: DC | PRN

## 2017-10-28 MED ORDER — CALCIUM CARBONATE-VITAMIN D 500-200 MG-UNIT PO TABS
1.0000 | ORAL_TABLET | Freq: Every day | ORAL | Status: DC
  Administered 2017-10-28 – 2017-10-31 (×3): 1 via ORAL
  Filled 2017-10-28 (×3): qty 1

## 2017-10-28 MED ORDER — DEXTROSE-NACL 5-0.45 % IV SOLN
INTRAVENOUS | Status: DC
Start: 2017-10-28 — End: 2017-10-31
  Administered 2017-10-28 – 2017-10-31 (×8): via INTRAVENOUS

## 2017-10-28 MED ORDER — PHENYLEPHRINE HCL-NACL 10-0.9 MG/250ML-% IV SOLN
0.0000 ug/min | INTRAVENOUS | Status: DC
  Administered 2017-10-28: 20 ug/min via INTRAVENOUS
  Filled 2017-10-28: qty 250

## 2017-10-28 MED ORDER — SODIUM CHLORIDE 0.9 % IV BOLUS
1000.0000 mL | Freq: Once | INTRAVENOUS | Status: AC
  Administered 2017-10-28: 1000 mL via INTRAVENOUS

## 2017-10-28 MED ORDER — PANTOPRAZOLE SODIUM 40 MG IV SOLR
40.0000 mg | Freq: Two times a day (BID) | INTRAVENOUS | Status: DC
  Administered 2017-10-28 – 2017-10-29 (×4): 40 mg via INTRAVENOUS
  Filled 2017-10-28 (×4): qty 40

## 2017-10-28 MED ORDER — ONDANSETRON HCL 4 MG/2ML IJ SOLN: 4.0000 mg | Freq: Three times a day (TID) | INTRAMUSCULAR | Status: DC | PRN

## 2017-10-28 MED ORDER — ADULT MULTIVITAMIN W/MINERALS CH
1.0000 | ORAL_TABLET | Freq: Every day | ORAL | Status: DC
  Administered 2017-10-28 – 2017-10-31 (×3): 1 via ORAL
  Filled 2017-10-28 (×3): qty 1

## 2017-10-28 NOTE — H&P (View-Only) (Signed)
[facility] Consult  Referring Provider: Pamala Hurry Vega/[facility] Primary Care Physician:  Andi Devon, MD Primary Gastroenterologist: Gentry Fitz  Reason for Consultation:  Anemia, FOBT positive stools  HPI: Carolyn Bishop is a 84 y.o. female submitted last night with complaints of generalized weakness for 1 week. Patient complains of decreased energy along with decreased appetite and not feeling well. Normally she has 1 bowel movement a day,but for the past few weeks she has about 2 dark stools per day. This is not associated with abdominal pain, nausea, vomiting. She complains of decreased urine output. She was found to be hypotensive, Patient denies heartburn, acid reflux, difficulty swallowing, pain on swallowing and complains of early satiety. She denies rectal bleeding. No prior colonoscopy.    Past Medical History:  Diagnosis Date  . Diabetes (HCC)   . HLD (hyperlipidemia)   . HTN (hypertension)   . Palpitations     Past Surgical History:  Procedure Laterality Date  . ABDOMINAL HYSTERECTOMY      Prior to Admission medications   Medication Sig Start Date End Date Taking? Authorizing Provider  acetaminophen (TYLENOL) 500 MG tablet Take 1,000 mg by mouth daily as needed (pain).   Yes [provider]  furosemide (LASIX) 40 MG tablet Take 40 mg by mouth daily.   Yes [provider]  glipiZIDE (GLUCOTROL XL) 5 MG 24 hr tablet Take 1 tablet by mouth daily.   Yes [provider]  losartan-hydrochlorothiazide (HYZAAR) 100-25 MG tablet Take 1 tablet by mouth daily.   Yes [provider]  metFORMIN (GLUCOPHAGE) 500 MG tablet Take 500 mg by mouth 2 (two) times daily with a meal.   Yes [provider]    Current Facility-Administered Medications  Medication Dose Route Frequency Provider Last Rate Last Dose  . acetaminophen (TYLENOL) tablet 650 mg  650 mg Oral Daily PRN Lorretta Harp, MD      . calcium-vitamin D (OSCAL  WITH D) 500-200 MG-UNIT per tablet 1 tablet  1 tablet Oral Daily Lorretta Harp, MD   1 tablet at 10/28/17 [phone]  . dextrose 5 %-0.45 % sodium chloride infusion   Intravenous Continuous Lorretta Harp, MD 100 mL/hr at 10/28/17 0030    . dextrose 50 % solution 50 mL  50 mL Intravenous PRN Lorretta Harp, MD      . insulin aspart (novoLOG) injection 0-9 Units  0-9 Units Subcutaneous TID WC Penny Pia, MD   5 Units at 10/28/17 1143  . multivitamin with minerals tablet 1 tablet  1 tablet Oral Daily Lorretta Harp, MD   1 tablet at 10/28/17 [phone]  . ondansetron (ZOFRAN) injection 4 mg  4 mg Intravenous Q8H PRN Lorretta Harp, MD      . pantoprazole (PROTONIX) injection 40 mg  40 mg Intravenous Q12H Lorretta Harp, MD   40 mg at 10/28/17 0937  . phenylephrine (NEOSYNEPHRINE) 10-0.9 MG/250ML-% infusion  0-400 mcg/min Intravenous Titrated Kalman Shan, MD   Stopped at 10/28/17 [phone]  . polyethylene glycol (MIRALAX / GLYCOLAX) packet 17 g  17 g Oral Daily PRN Lorretta Harp, MD      . senna-docusate (Senokot-S) tablet 1 tablet  1 tablet Oral BID PRN Lorretta Harp, MD      . zolpidem (AMBIEN) tablet 5 mg  5 mg Oral QHS PRN Lorretta Harp, MD        Allergies as of 10/27/2017  . (No Known Allergies)    Family History  Problem Relation Age of Onset  . Hypertension Mother   .  Lung cancer Father     Social History   Socioeconomic History  . Marital status: Divorced    Spouse name: Not on file  . Number of children: Not on file  . Years of education: Not on file  . Highest education level: Not on file  Occupational History  . Not on file  Social Needs  . Financial resource strain: Not on file  . Food insecurity:    Worry: Not on file    Inability: Not on file  . Transportation needs:    Medical: Not on file    Non-medical: Not on file  Tobacco Use  . Smoking status: Never Smoker  . Smokeless tobacco: Never Used  Substance and Sexual Activity  . Alcohol use: No    Alcohol/week: 0.0 oz  . Drug use: No  . Sexual  activity: Not on file  Lifestyle  . Physical activity:    Days per week: Not on file    Minutes per session: Not on file  . Stress: Not on file  Relationships  . Social connections:    Talks on phone: Not on file    Gets together: Not on file    Attends religious service: Not on file    Active member of club or organization: Not on file    Attends meetings of clubs or organizations: Not on file    Relationship status: Not on file  . Intimate partner violence:    Fear of current or ex partner: Not on file    Emotionally abused: Not on file    Physically abused: Not on file    Forced sexual activity: Not on file  Other Topics Concern  . Not on file  Social History Narrative  . Not on file    Review of Systems: Positive for: GI: Described in detail in HPI.    Gen: anorexia, fatigue, weakness, malaise, Denies any fever, chills, rigors, night sweats, involuntary weight loss, and sleep disorder CV: Denies chest pain, angina, palpitations, syncope, orthopnea, PND, peripheral edema, and claudication. Resp: Denies dyspnea, cough, sputum, wheezing, coughing up blood. GU : Denies urinary burning, blood in urine, urinary frequency, urinary hesitancy, nocturnal urination, and urinary incontinence. MS: Denies joint pain or swelling.  Denies muscle weakness, cramps, atrophy.  Derm: Denies rash, itching, oral ulcerations, hives, unhealing ulcers.  Psych: Denies depression, anxiety, memory loss, suicidal ideation, hallucinations,  and confusion. Heme: Denies bruising, bleeding, and enlarged lymph nodes. Neuro:  Denies any headaches, dizziness, paresthesias. Endo:   DM, Denies any problems with thyroid, adrenal function.  Physical Exam: Vital signs in last 24 hours: Temp:  [97.6 F (36.4 C)-98.5 F (36.9 C)] 97.6 F (36.4 C) (06/21 1220) Pulse Rate:  [68-98] 72 (06/21 1115) Resp:  [11-25] 11 (06/21 1115) BP: (70-138)/(30-76) 115/66 (06/21 1115) SpO2:  [96 %-100 %] 100 % (06/21  1115) Weight:  [54.4 kg (120 lb)] 54.4 kg (120 lb) (06/20 1748)    General:   Alert,  Well-developed, well-nourished, pleasant and cooperative in NAD Appears younger than stated age Head:  Normocephalic and atraumatic. Eyes:  Sclera clear, no icterus.   Mild pallor Ears:  Normal auditory acuity. Nose:  No deformity, discharge,  or lesions. Mouth:  No deformity or lesions.  Oropharynx pink & moist. Neck:  Supple; no masses or thyromegaly. Lungs:  Clear throughout to auscultation.   No wheezes, crackles, or rhonchi. No acute distress. Heart:  Regular rate and rhythm; no murmurs, clicks, rubs,  or gallops. Extremities:  Without  clubbing or edema. Neurologic:  Alert and  oriented x4;  grossly normal neurologically. Skin:  Intact without significant lesions or rashes. Psych:  Alert and cooperative. Normal mood and affect. Abdomen:  Soft, nontender and nondistended. No masses, hepatosplenomegaly or hernias noted. Normal bowel sounds, without guarding, and without rebound.         Lab Results: Recent Labs    10/28/17 0322 10/28/17 0608 10/28/17 1138  WBC 5.2 5.0 4.0  HGB 7.7* 7.8* 8.0*  HCT 23.2* 23.3* 23.5*  PLT 183 197 185   BMET Recent Labs    10/27/17 1755 10/28/17 0322 10/28/17 0608  NA 134* 137 139  K 4.7 4.5 4.6  CL 93* 101 104  CO2 26 23 25   GLUCOSE 38* 342* 268*  BUN 162* 131* 132*  CREATININE 3.79* 3.16* 2.98*  CALCIUM 9.7 8.8* 8.8*   LFT Recent Labs    10/27/17 1755  PROT 7.8  ALBUMIN 4.0  AST 27  ALT 14  ALKPHOS 45  BILITOT 0.3   PT/INR Recent Labs    10/28/17 0018  LABPROT 12.8  INR 0.97    Studies/Results: Dg Chest 2 View  Result Date: 10/27/2017 CLINICAL DATA:  Pt niece reports pt with generalized weakness x 1 week, mostly in the mornings. Niece states "she just hasn't been herself this week" pt is awake and alert, oriented x 4, denies any pain or other c/o at this time. EXAM: CHEST - 2 VIEW COMPARISON:  07/27/2008, abdominal CT on  02/21/2015 FINDINGS: Heart size is normal. The lungs are free of focal consolidations. A persistent mass is identified at the LEFT lung base, estimated to be 15 millimeters in diameter. IMPRESSION: No pulmonary edema or consolidation. Increased size of LEFT LOWER lobe nodule. Further evaluation is recommended with CT of the chest. Electronically Signed   By: Norva Pavlov M.D.   On: 10/27/2017 19:19   Mr Brain Wo Contrast  Result Date: 10/28/2017 CLINICAL DATA:  Intermittent LEFT arm weakness for 3 days. EXAM: MRI HEAD WITHOUT CONTRAST TECHNIQUE: Multiplanar, multiecho pulse sequences of the brain and surrounding structures were obtained without intravenous contrast. COMPARISON:  MR brain 07/11/2004. FINDINGS: Brain: No acute stroke, acute hemorrhage, mass lesion, or extra-axial fluid. Generalized atrophy. Hydrocephalus ex vacuo. Extensive T2 and FLAIR hyperintensities throughout the white matter, consistent with small vessel disease. Vascular: Flow voids are maintained. Skull and upper cervical spine: Partial empty sella.  Mild pannus. Sinuses/Orbits: Chronic mucosal thickening. No significant layering sinus fluid. No orbital findings. Other: Compared with 2006 there is marked progression of atrophy and small vessel disease. IMPRESSION: Atrophy and small vessel disease with marked progression since 2006. No acute intracranial findings. Electronically Signed   By: Elsie Stain M.D.   On: 10/28/2017 11:40   US Renal  Result Date: 10/28/2017 CLINICAL DATA:  Acute renal failure. EXAM: RENAL / URINARY TRACT ULTRASOUND COMPLETE COMPARISON:  CT 02/21/2015. FINDINGS: Right Kidney: Length: 10.4 cm. Cortical irregularity suggesting scarring. Echogenicity within normal limits. 1.4 cm simple cyst. No hydronephrosis visualized. Left Kidney: Length: 10.0 cm. Cortical irregularity suggesting scarring. Echogenicity within normal limits. 2.4 cm simple cyst. 1.3 cm hyperechoic solid lesion left mid kidney. This is  essentially unchanged from prior exam. No hydronephrosis visualized. Bladder: Foley catheter is in place.  No bladder distention. IMPRESSION: 1. Bilateral renal cortical irregularity suggesting scarring. No acute abnormality. No hydronephrosis or bladder distention. Foley catheter is in the bladder. The bladder is decompressed. 2. 1.3 cm hyperechoic solid lesion left mid kidney. This is  essentially unchanged from prior CT study of 02/21/2015. Electronically Signed   By: Maisie Fus  Register   On: 10/28/2017 08:31    Impression: 1. Anemia- Hb 8.3 on admission, remained stable at 7.7/7.8/8 Normocytic FOBT positive, dark stools  2. Hypovolemic shock, lactic acid 3.5 on admission 3. Acute renal failure,GFR 15,no acidosis 4. Elevated BUN/creatinine ratio of 132/2.98   Plan: Possibility of UGIB bleeding with dark stools, anemia, FOBT positive stools. Discussed about the risk and benefits of endoscopic procedure, the patient and her niece at bedside understand and agree. Patient currently on Protonix 40 mg twice a day, hemodynamically stable post fluid resuscitation, has not required blood transfusion. Will remain NPO post midnight for EGD in am.    LOS: 1 day   Kerin Salen, MD  10/28/2017, 1:06 PM  Pager [phone] If no answer or after 5 PM call [phone]

## 2017-10-28 NOTE — Progress Notes (Signed)
  Echocardiogram 2D Echocardiogram has been performed.  Safwan Tomei T Khyler Eschmann 10/28/2017, 3:24 PM

## 2017-10-28 NOTE — Progress Notes (Signed)
PROGRESS NOTE    Carolyn Bishop  ZOX:[id number] DOB: [birth year] DOA: 10/27/2017 PCP: Andi Devon, MD   Brief Narrative:  84 y.o. female with medical history significant of hypertension, hyperlipidemia, diabetes mellitus, who presents with generalized weakness and intermittent left arm weakness  Assessment & Plan:   Principal Problem:     Hypotension - suspected secondary to decreased intravascular volume - critical care physician assisting with management. - no source of infection identified. U/A and chest x ray negative.  AKI (acute kidney injury) (HCC) - Most likely due to prerenal etiology - continue IVF rehydration.  Lactic acidosis - most likely due to transient hypotension  Active Problems:    Diabetes mellitus without complication (HCC) -  Place on diabetic diet - SSI sensitive scale    GIB (gastrointestinal bleeding) - Pt with FOBT positive.  - Will plan on transfusing if Hgb less than 8 - Consulted [facility] for unassigned patient evaluation.     Lung mass   Left arm weakness - MRI of brain    HLD (hyperlipidemia)    DVT prophylaxis: SCD's Code Status: Full Family Communication: None at bedside. Disposition Plan: Pending improvement in condition. Still acute   Consultants:   PCCM  [facility]   Procedures: None   Antimicrobials: none   Subjective: Pt has no new complaints. Does not remember why she is in hospital.  Objective: Vitals:   10/28/17 0845 10/28/17 0900 10/28/17 0915 10/28/17 0930  BP: (!) 106/40 (!) 97/38 (!) 116/39 (!) 96/32  Pulse: 76 78 79 75  Resp: 19 16 18 16   Temp:      TempSrc:      SpO2: 99% 98% 99% 99%  Weight:      Height:        Intake/Output Summary (Last 24 hours) at 10/28/2017 1007 Last data filed at 10/28/2017 0600 Gross per 24 hour  Intake 2000 ml  Output 1800 ml  Net 200 ml   Filed Weights   10/27/17 1748  Weight: 54.4 kg (120 lb)    Examination:  General exam: Appears calm and comfortable,  in nad.  Respiratory system: Clear to auscultation. Respiratory effort normal. Cardiovascular system: S1 & S2 heard, RRR. No JVD, murmurs, rubs, gallops Gastrointestinal system: Abdomen is nondistended, soft and nontender. No organomegaly or masses felt. Normal bowel sounds heard. Central nervous system: Alert and awake. No facial asymmetry, moves extremities spontaneously Extremities: warm no cyanosis Skin: No rashes, lesions or ulcers, on limited exam. Psychiatry:  Mood & affect appropriate.     Data Reviewed: I have personally reviewed following labs and imaging studies  CBC: Recent Labs  Lab 10/27/17 1755 10/28/17 0018 10/28/17 0322 10/28/17 0608  WBC 4.6 5.3 5.2 5.0  HGB 8.8* 8.3* 7.7* 7.8*  HCT 25.9* 25.4* 23.2* 23.3*  MCV 89.9 90.7 90.6 90.7  PLT 216 194 183 197   Basic Metabolic Panel: Recent Labs  Lab 10/27/17 1755 10/28/17 0322 10/28/17 0608  NA 134* 137 139  K 4.7 4.5 4.6  CL 93* 101 104  CO2 26 23 25   GLUCOSE 38* 342* 268*  BUN 162* 131* 132*  CREATININE 3.79* 3.16* 2.98*  CALCIUM 9.7 8.8* 8.8*   GFR: Estimated Creatinine Clearance: 11.8 mL/min (A) (by C-G formula based on SCr of 2.98 mg/dL (H)). Liver Function Tests: Recent Labs  Lab 10/27/17 1755  AST 27  ALT 14  ALKPHOS 45  BILITOT 0.3  PROT 7.8  ALBUMIN 4.0   No results for input(s): LIPASE, AMYLASE in  the last 168 hours. No results for input(s): AMMONIA in the last 168 hours. Coagulation Profile: Recent Labs  Lab 10/28/17 0018  INR 0.97   Cardiac Enzymes: Recent Labs  Lab 10/28/17 0608  TROPONINI <0.03   BNP (last 3 results) No results for input(s): PROBNP in the last 8760 hours. HbA1C: No results for input(s): HGBA1C in the last 72 hours. CBG: Recent Labs  Lab 10/28/17 0034 10/28/17 0200 10/28/17 0425 10/28/17 0623 10/28/17 0755  GLUCAP 227* 343* 288* 266* 299*   Lipid Profile: Recent Labs    10/28/17 0322  CHOL 165  HDL 34*  LDLCALC 90  TRIG 161206*  CHOLHDL 4.9    Thyroid Function Tests: No results for input(s): TSH, T4TOTAL, FREET4, T3FREE, THYROIDAB in the last 72 hours. Anemia Panel: Recent Labs    10/27/17 1755 10/27/17 2010  VITAMINB12  --  295  FOLATE  --  13.0  FERRITIN  --  69  TIBC  --  337  IRON  --  51  RETICCTPCT 1.1  --    Sepsis Labs: Recent Labs  Lab 10/27/17 1818 10/27/17 2013 10/28/17 0018 10/28/17 0322  PROCALCITON  --   --  0.17  --   LATICACIDVEN 1.81 2.22* 3.5* 2.5*    Recent Results (from the past 240 hour(s))  MRSA PCR Screening     Status: None   Collection Time: 10/27/17 11:25 PM  Result Value Ref Range Status   MRSA by PCR NEGATIVE NEGATIVE Final    Comment:        The GeneXpert MRSA Assay (FDA approved for NASAL specimens only), is one component of a comprehensive MRSA colonization surveillance program. It is not intended to diagnose MRSA infection nor to guide or monitor treatment for MRSA infections. Performed at [facility], [address] [place], [place] [address]       Radiology Studies: Dg Chest 2 View  Result Date: 10/27/2017 CLINICAL DATA:  Pt niece reports pt with generalized weakness x 1 week, mostly in the mornings. Niece states "she just hasn't been herself this week" pt is awake and alert, oriented x 4, denies any pain or other c/o at this time. EXAM: CHEST - 2 VIEW COMPARISON:  07/27/2008, abdominal CT on 02/21/2015 FINDINGS: Heart size is normal. The lungs are free of focal consolidations. A persistent mass is identified at the LEFT lung base, estimated to be 15 millimeters in diameter. IMPRESSION: No pulmonary edema or consolidation. Increased size of LEFT LOWER lobe nodule. Further evaluation is recommended with CT of the chest. Electronically Signed   By: Norva PavlovElizabeth  Brown M.D.   On: 10/27/2017 19:19   [place] Renal  Result Date: 10/28/2017 CLINICAL DATA:  Acute renal failure. EXAM: RENAL / URINARY TRACT ULTRASOUND COMPLETE COMPARISON:  CT 02/21/2015. FINDINGS:  Right Kidney: Length: 10.4 cm. Cortical irregularity suggesting scarring. Echogenicity within normal limits. 1.4 cm simple cyst. No hydronephrosis visualized. Left Kidney: Length: 10.0 cm. Cortical irregularity suggesting scarring. Echogenicity within normal limits. 2.4 cm simple cyst. 1.3 cm hyperechoic solid lesion left mid kidney. This is essentially unchanged from prior exam. No hydronephrosis visualized. Bladder: Foley catheter is in place.  No bladder distention. IMPRESSION: 1. Bilateral renal cortical irregularity suggesting scarring. No acute abnormality. No hydronephrosis or bladder distention. Foley catheter is in the bladder. The bladder is decompressed. 2. 1.3 cm hyperechoic solid lesion left mid kidney. This is essentially unchanged from prior CT study of 02/21/2015. Electronically Signed   By: Maisie Fushomas  Register   On:  10/28/2017 08:31    Scheduled Meds: . calcium-vitamin D  1 tablet Oral Daily  . multivitamin with minerals  1 tablet Oral Daily  . pantoprazole  40 mg Intravenous Q12H   Continuous Infusions: . dextrose 5 % and 0.45% NaCl 100 mL/hr at 10/28/17 0030  . phenylephrine (NEO-SYNEPHRINE) Adult infusion Stopped (10/28/17 0928)     LOS: 1 day    Time spent: 36 min  Penny Pia, MD [facility] Pager [phone]  If 7PM-7AM, please contact night-coverage [email or web address] Password [facility] 10/28/2017, 10:07 AM

## 2017-10-28 NOTE — Progress Notes (Signed)
CRITICAL VALUE ALERT  Critical Value:  Lactic Acid 3.5  Date & Time Notied:  10/28/2017 0100 am  Provider Notified: [facility]  Orders Received/Actions taken: waiting for orders, will continue to monitor

## 2017-10-28 NOTE — Consult Note (Signed)
PULMONARY / CRITICAL CARE MEDICINE   Name: Carolyn Bishop MRN: [id number] DOB: [birth year]    ADMISSION DATE:  10/27/2017 CONSULTATION DATE: 10/28/2017  REFERRING MD:  Dr Clyde Lundborg, TRH  CHIEF COMPLAINT: Shock  HISTORY OF PRESENT ILLNESS:   84 year old woman, never smoker, with a history of hypertension, diabetes, hyperlipidemia, slowly enlarging left lower lobe nodule.  She "has not felt well" for over a week.  She is had some generalized weakness and has been less able to get around the home.  Her oral intake has been decreased but otherwise no significant focal symptoms, fevers, chills, nausea/vomiting.  In the emergency department she was found to be hypotensive, initially responsive to IV fluid resuscitation.  She was also hypoglycemic and had acute renal failure.  No evidence for infection noted.  Early morning 6/21 she was noted to have recurrent hypotension with systolics to the 70s, depression of her mental status despite IV fluids.  PAST MEDICAL HISTORY :  She  has a past medical history of Diabetes (HCC), HLD (hyperlipidemia), HTN (hypertension), and Palpitations.  PAST SURGICAL HISTORY: She  has a past surgical history that includes Abdominal hysterectomy.  No Known Allergies  No current facility-administered medications on file prior to encounter.    Current Outpatient Medications on File Prior to Encounter  Medication Sig  . acetaminophen (TYLENOL) 500 MG tablet Take 1,000 mg by mouth daily as needed (pain).  . furosemide (LASIX) 40 MG tablet Take 40 mg by mouth daily.  Marland Kitchen glipiZIDE (GLUCOTROL XL) 5 MG 24 hr tablet Take 1 tablet by mouth daily.  Marland Kitchen losartan-hydrochlorothiazide (HYZAAR) 100-25 MG tablet Take 1 tablet by mouth daily.  . metFORMIN (GLUCOPHAGE) 500 MG tablet Take 500 mg by mouth 2 (two) times daily with a meal.    FAMILY HISTORY:  Her indicated that the status of her mother is unknown. She indicated that the status of her father is unknown.   SOCIAL HISTORY: She   reports that she has never smoked. She has never used smokeless tobacco. She reports that she does not drink alcohol or use drugs.  REVIEW OF SYSTEMS:   She complains of low appetite, weakness and inability to ambulate effectively but otherwise no significant complaints  SUBJECTIVE:  States that she is feeling better, more awake Currently on phenylephrine 30  VITAL SIGNS: BP (!) 116/47   Pulse 73   Temp 98.2 F (36.8 C) (Oral)   Resp 16   Ht 5\' 3"  (1.6 m)   Wt 54.4 kg (120 lb)   SpO2 100%   BMI 21.26 kg/m   HEMODYNAMICS:    VENTILATOR SETTINGS:    INTAKE / OUTPUT: I/O last 3 completed shifts: In: 2000 [IV Piggyback:2000] Out: 1800 [Urine:1800]  PHYSICAL EXAMINATION: General: Elderly woman, comfortable in bed Neuro: Awake and interacting, answering questions, oriented, moves extremities HEENT: Oropharynx clear, pupils equal Cardiovascular: Regular, no murmur, distant Lungs: Clear bilaterally, no crackles, no wheezes Abdomen: Soft, nontender, positive bowel sounds Musculoskeletal: No deformity Skin: No rash  LABS:  BMET Recent Labs  Lab 10/27/17 1755 10/28/17 0322 10/28/17 0608  NA 134* 137 139  K 4.7 4.5 4.6  CL 93* 101 104  CO2 26 23 25   BUN 162* 131* 132*  CREATININE 3.79* 3.16* 2.98*  GLUCOSE 38* 342* 268*    Electrolytes Recent Labs  Lab 10/27/17 1755 10/28/17 0322 10/28/17 0608  CALCIUM 9.7 8.8* 8.8*    CBC Recent Labs  Lab 10/28/17 0018 10/28/17 0322 10/28/17 0608  WBC 5.3 5.2 5.0  HGB 8.3* 7.7* 7.8*  HCT 25.4* 23.2* 23.3*  PLT 194 183 197    Coag's Recent Labs  Lab 10/28/17 0018  APTT 21*  INR 0.97    Sepsis Markers Recent Labs  Lab 10/27/17 2013 10/28/17 0018 10/28/17 0322  LATICACIDVEN 2.22* 3.5* 2.5*  PROCALCITON  --  0.17  --     ABG Recent Labs  Lab 10/28/17 0607  PHART 7.421  PCO2ART 34.4  PO2ART 85.5    Liver Enzymes Recent Labs  Lab 10/27/17 1755  AST 27  ALT 14  ALKPHOS 45  BILITOT 0.3   ALBUMIN 4.0    Cardiac Enzymes Recent Labs  Lab 10/28/17 0608  TROPONINI <0.03    Glucose Recent Labs  Lab 10/27/17 2226 10/28/17 0034 10/28/17 0200 10/28/17 0425 10/28/17 0623 10/28/17 0755  GLUCAP 42* 227* 343* 288* 266* 299*    Imaging Dg Chest 2 View  Result Date: 10/27/2017 CLINICAL DATA:  Pt niece reports pt with generalized weakness x 1 week, mostly in the mornings. Niece states "she just hasn't been herself this week" pt is awake and alert, oriented x 4, denies any pain or other c/o at this time. EXAM: CHEST - 2 VIEW COMPARISON:  07/27/2008, abdominal CT on 02/21/2015 FINDINGS: Heart size is normal. The lungs are free of focal consolidations. A persistent mass is identified at the LEFT lung base, estimated to be 15 millimeters in diameter. IMPRESSION: No pulmonary edema or consolidation. Increased size of LEFT LOWER lobe nodule. Further evaluation is recommended with CT of the chest. Electronically Signed   By: Norva Pavlov M.D.   On: 10/27/2017 19:19   US Renal  Result Date: 10/28/2017 CLINICAL DATA:  Acute renal failure. EXAM: RENAL / URINARY TRACT ULTRASOUND COMPLETE COMPARISON:  CT 02/21/2015. FINDINGS: Right Kidney: Length: 10.4 cm. Cortical irregularity suggesting scarring. Echogenicity within normal limits. 1.4 cm simple cyst. No hydronephrosis visualized. Left Kidney: Length: 10.0 cm. Cortical irregularity suggesting scarring. Echogenicity within normal limits. 2.4 cm simple cyst. 1.3 cm hyperechoic solid lesion left mid kidney. This is essentially unchanged from prior exam. No hydronephrosis visualized. Bladder: Foley catheter is in place.  No bladder distention. IMPRESSION: 1. Bilateral renal cortical irregularity suggesting scarring. No acute abnormality. No hydronephrosis or bladder distention. Foley catheter is in the bladder. The bladder is decompressed. 2. 1.3 cm hyperechoic solid lesion left mid kidney. This is essentially unchanged from prior CT study of  02/21/2015. Electronically Signed   By: Maisie Fus  Register   On: 10/28/2017 08:31     STUDIES:  Chest x-ray 6/20 >> normal heart shadow, no infiltrates.  There is a persistent left lower lobe nodule possibly increased in size compared with CT abdomen from October 2016. Renal ultrasound 6/21 >> 1.4 cm right kidney simple cyst, 2.4 cm left simple cyst, 1.3 cm hyperechoic solid lesion left mid kidney, stable from prior.  No hydronephrosis, no bladder distention or abnormality  CULTURES: Blood 6/20 >> Urine 6/20 >>  ANTIBIOTICS:  SIGNIFICANT EVENTS: To ICU 6/21 am with hypotension, started phenylephrine  LINES/TUBES:  DISCUSSION: 84 year old woman with hypertension, diabetes, hyperlipidemia.  Decreased p.o. intake over greater than a week.  Progressive weakness.  Admitted with shock, hypoglycemia, acute renal failure.  Receiving IV fluid resuscitation.  Moved to ICU with persistent hypotension, started on phenylephrine.  ASSESSMENT / PLAN:  PULMONARY A: Left lower lobe pulmonary nodule P:   Identified on imaging as far back as CT abdomen 02/21/2015.  This will need to be followed to give advice about  potential work-up.  Recommend CT scan of the chest with contrast at some point when her renal function has stabilized, and then follow-up with pulmonary as an outpatient to discuss options  CARDIOVASCULAR A:  Shock, likely hypovolemic due to poor p.o. intake on Hyzaar, Lasix.  No focal signs of infection at this time P:  Lasix, Hyzaar on hold Volume resuscitation Phenylephrine currently at 20, wean as able with volume administration Cultures pending as below Single dose hydrocortisone given on presentation.  Not on chronic steroids  RENAL A:   Acute renal failure, suspect ATN from hypovolemia while still taking her Lasix, Hyzaar, Naprosyn.  Consider possible component of obstructive uropathy P:   Renal ultrasound 6/21 without any evidence of hydronephrosis or bladder distention,  obstruction Volume resuscitation Hold her Hyzaar, Lasix, NSAID Follow urine output, BMP for improvement  GASTROINTESTINAL A:   Protein calorie malnutrition P:   Diet, nutritional supplements  HEMATOLOGIC A:   No acute issues P:  Follow CBC intermittently  INFECTIOUS A:   No current evidence for acute infection P:   Blood and urine cultures collected, low threshold to initiate empiric antibiotics if she declines hemodynamically  ENDOCRINE A:   History of diabetes Hypoglycemia on presentation P:   Home metformin on hold D5 half-normal saline infusing 100 cc/h.  Would be willing to tolerate some relative hyperglycemia temporarily given her hypovolemia, poor p.o. intake recently  NEUROLOGIC A:   Acute encephalopathy, improved with restoration of blood pressure P:   RASS goal: n/a Avoid sedating medications if at all possible   FAMILY  - Updates: None present at bedside on 6/21  - Inter-disciplinary family meet or Palliative Care meeting due by: 11/03/2017  Independent CC time 40 minutes  Levy Pupaobert Lehua Flores, MD, PhD 10/28/2017, 10:12 AM [facility] [phone] or if no answer [phone]

## 2017-10-28 NOTE — Progress Notes (Signed)
RN updated Dr. Delton CoombesByrum on patients increasing blood sugars, requesting coverage.  MD to place orders for patient.  Will continue to monitor.

## 2017-10-28 NOTE — Progress Notes (Signed)
eLink Physician-Brief Progress Note Patient Name: Noreene FilbertRamona Chaudhary DOB: [birth year] MRN: [id number]  S: RN calling into elink.  Hypotension sbp 70s with MAP 40s and assoixted obtundation x 1.  Despite no hypoglycemia and and getting fluid bolus  Antibiotics Given (last 72 hours)    None      On cam exam  - less respnsive  - no distrees   PULMONARY No results for input(s): PHART, PCO2ART, PO2ART, HCO3, TCO2, O2SAT in the last 168 hours.  Invalid input(s): PCO2, PO2  CBC Recent Labs  Lab 10/27/17 1755 10/28/17 0018 10/28/17 0322  HGB 8.8* 8.3* 7.7*  HCT 25.9* 25.4* 23.2*  WBC 4.6 5.3 5.2  PLT 216 194 183    COAGULATION Recent Labs  Lab 10/28/17 0018  INR 0.97    CARDIAC  No results for input(s): TROPONINI in the last 168 hours. No results for input(s): PROBNP in the last 168 hours.   CHEMISTRY Recent Labs  Lab 10/27/17 1755  NA 134*  K 4.7  CL 93*  CO2 26  GLUCOSE 38*  BUN 162*  CREATININE 3.79*  CALCIUM 9.7   Estimated Creatinine Clearance: 9.3 mL/min (A) (by C-G formula based on SCr of 3.79 mg/dL (H)).   LIVER Recent Labs  Lab 10/27/17 1755 10/28/17 0018  AST 27  --   ALT 14  --   ALKPHOS 45  --   BILITOT 0.3  --   PROT 7.8  --   ALBUMIN 4.0  --   INR  --  0.97     INFECTIOUS Recent Labs  Lab 10/27/17 2013 10/28/17 0018 10/28/17 0322  LATICACIDVEN 2.22* 3.5* 2.5*  PROCALCITON  --  0.17  --      ENDOCRINE CBG (last 3)  Recent Labs    10/28/17 0034 10/28/17 0200 10/28/17 0425  GLUCAP 227* 343* 288*         IMAGING x48h  - image(s) personally visualized  -   highlighted in bold Dg Chest 2 View  Result Date: 10/27/2017 CLINICAL DATA:  Pt niece reports pt with generalized weakness x 1 week, mostly in the mornings. Niece states "she just hasn't been herself this week" pt is awake and alert, oriented x 4, denies any pain or other c/o at this time. EXAM: CHEST - 2 VIEW COMPARISON:  07/27/2008, abdominal CT on 02/21/2015  FINDINGS: Heart size is normal. The lungs are free of focal consolidations. A persistent mass is identified at the LEFT lung base, estimated to be 15 millimeters in diameter. IMPRESSION: No pulmonary edema or consolidation. Increased size of LEFT LOWER lobe nodule. Further evaluation is recommended with CT of the chest. Electronically Signed   By: Norva PavlovElizabeth  Brown M.D.   On: 10/27/2017 19:19       A) SHock Obtrundation  P) Start neo Check ABG stat Check TROP stat Check bmet stat    Intervention Category Major Interventions: Hypotension - evaluation and management  Ginger Leeth 10/28/2017, 5:14 AM

## 2017-10-28 NOTE — Consult Note (Addendum)
[facility] Consult  Referring Provider: Pamala Hurry Vega/[facility] Primary Care Physician:  Andi Devon, MD Primary Gastroenterologist: Gentry Fitz  Reason for Consultation:  Anemia, FOBT positive stools  HPI: Carolyn Bishop is a 84 y.o. female submitted last night with complaints of generalized weakness for 1 week. Patient complains of decreased energy along with decreased appetite and not feeling well. Normally she has 1 bowel movement a day,but for the past few weeks she has about 2 dark stools per day. This is not associated with abdominal pain, nausea, vomiting. She complains of decreased urine output. She was found to be hypotensive, Patient denies heartburn, acid reflux, difficulty swallowing, pain on swallowing and complains of early satiety. She denies rectal bleeding. No prior colonoscopy.    Past Medical History:  Diagnosis Date  . Diabetes (HCC)   . HLD (hyperlipidemia)   . HTN (hypertension)   . Palpitations     Past Surgical History:  Procedure Laterality Date  . ABDOMINAL HYSTERECTOMY      Prior to Admission medications   Medication Sig Start Date End Date Taking? Authorizing Provider  acetaminophen (TYLENOL) 500 MG tablet Take 1,000 mg by mouth daily as needed (pain).   Yes [provider]  furosemide (LASIX) 40 MG tablet Take 40 mg by mouth daily.   Yes [provider]  glipiZIDE (GLUCOTROL XL) 5 MG 24 hr tablet Take 1 tablet by mouth daily.   Yes [provider]  losartan-hydrochlorothiazide (HYZAAR) 100-25 MG tablet Take 1 tablet by mouth daily.   Yes [provider]  metFORMIN (GLUCOPHAGE) 500 MG tablet Take 500 mg by mouth 2 (two) times daily with a meal.   Yes [provider]    Current Facility-Administered Medications  Medication Dose Route Frequency Provider Last Rate Last Dose  . acetaminophen (TYLENOL) tablet 650 mg  650 mg Oral Daily PRN Lorretta Harp, MD      . calcium-vitamin D (OSCAL  WITH D) 500-200 MG-UNIT per tablet 1 tablet  1 tablet Oral Daily Lorretta Harp, MD   1 tablet at 10/28/17 [phone]  . dextrose 5 %-0.45 % sodium chloride infusion   Intravenous Continuous Lorretta Harp, MD 100 mL/hr at 10/28/17 0030    . dextrose 50 % solution 50 mL  50 mL Intravenous PRN Lorretta Harp, MD      . insulin aspart (novoLOG) injection 0-9 Units  0-9 Units Subcutaneous TID WC Penny Pia, MD   5 Units at 10/28/17 1143  . multivitamin with minerals tablet 1 tablet  1 tablet Oral Daily Lorretta Harp, MD   1 tablet at 10/28/17 [phone]  . ondansetron (ZOFRAN) injection 4 mg  4 mg Intravenous Q8H PRN Lorretta Harp, MD      . pantoprazole (PROTONIX) injection 40 mg  40 mg Intravenous Q12H Lorretta Harp, MD   40 mg at 10/28/17 0937  . phenylephrine (NEOSYNEPHRINE) 10-0.9 MG/250ML-% infusion  0-400 mcg/min Intravenous Titrated Kalman Shan, MD   Stopped at 10/28/17 [phone]  . polyethylene glycol (MIRALAX / GLYCOLAX) packet 17 g  17 g Oral Daily PRN Lorretta Harp, MD      . senna-docusate (Senokot-S) tablet 1 tablet  1 tablet Oral BID PRN Lorretta Harp, MD      . zolpidem (AMBIEN) tablet 5 mg  5 mg Oral QHS PRN Lorretta Harp, MD        Allergies as of 10/27/2017  . (No Known Allergies)    Family History  Problem Relation Age of Onset  . Hypertension Mother   .  Lung cancer Father     Social History   Socioeconomic History  . Marital status: Divorced    Spouse name: Not on file  . Number of children: Not on file  . Years of education: Not on file  . Highest education level: Not on file  Occupational History  . Not on file  Social Needs  . Financial resource strain: Not on file  . Food insecurity:    Worry: Not on file    Inability: Not on file  . Transportation needs:    Medical: Not on file    Non-medical: Not on file  Tobacco Use  . Smoking status: Never Smoker  . Smokeless tobacco: Never Used  Substance and Sexual Activity  . Alcohol use: No    Alcohol/week: 0.0 oz  . Drug use: No  . Sexual  activity: Not on file  Lifestyle  . Physical activity:    Days per week: Not on file    Minutes per session: Not on file  . Stress: Not on file  Relationships  . Social connections:    Talks on phone: Not on file    Gets together: Not on file    Attends religious service: Not on file    Active member of club or organization: Not on file    Attends meetings of clubs or organizations: Not on file    Relationship status: Not on file  . Intimate partner violence:    Fear of current or ex partner: Not on file    Emotionally abused: Not on file    Physically abused: Not on file    Forced sexual activity: Not on file  Other Topics Concern  . Not on file  Social History Narrative  . Not on file    Review of Systems: Positive for: GI: Described in detail in HPI.    Gen: anorexia, fatigue, weakness, malaise, Denies any fever, chills, rigors, night sweats, involuntary weight loss, and sleep disorder CV: Denies chest pain, angina, palpitations, syncope, orthopnea, PND, peripheral edema, and claudication. Resp: Denies dyspnea, cough, sputum, wheezing, coughing up blood. GU : Denies urinary burning, blood in urine, urinary frequency, urinary hesitancy, nocturnal urination, and urinary incontinence. MS: Denies joint pain or swelling.  Denies muscle weakness, cramps, atrophy.  Derm: Denies rash, itching, oral ulcerations, hives, unhealing ulcers.  Psych: Denies depression, anxiety, memory loss, suicidal ideation, hallucinations,  and confusion. Heme: Denies bruising, bleeding, and enlarged lymph nodes. Neuro:  Denies any headaches, dizziness, paresthesias. Endo:   DM, Denies any problems with thyroid, adrenal function.  Physical Exam: Vital signs in last 24 hours: Temp:  [97.6 F (36.4 C)-98.5 F (36.9 C)] 97.6 F (36.4 C) (06/21 1220) Pulse Rate:  [68-98] 72 (06/21 1115) Resp:  [11-25] 11 (06/21 1115) BP: (70-138)/(30-76) 115/66 (06/21 1115) SpO2:  [96 %-100 %] 100 % (06/21  1115) Weight:  [54.4 kg (120 lb)] 54.4 kg (120 lb) (06/20 1748)    General:   Alert,  Well-developed, well-nourished, pleasant and cooperative in NAD Appears younger than stated age Head:  Normocephalic and atraumatic. Eyes:  Sclera clear, no icterus.   Mild pallor Ears:  Normal auditory acuity. Nose:  No deformity, discharge,  or lesions. Mouth:  No deformity or lesions.  Oropharynx pink & moist. Neck:  Supple; no masses or thyromegaly. Lungs:  Clear throughout to auscultation.   No wheezes, crackles, or rhonchi. No acute distress. Heart:  Regular rate and rhythm; no murmurs, clicks, rubs,  or gallops. Extremities:  Without  clubbing or edema. Neurologic:  Alert and  oriented x4;  grossly normal neurologically. Skin:  Intact without significant lesions or rashes. Psych:  Alert and cooperative. Normal mood and affect. Abdomen:  Soft, nontender and nondistended. No masses, hepatosplenomegaly or hernias noted. Normal bowel sounds, without guarding, and without rebound.         Lab Results: Recent Labs    10/28/17 0322 10/28/17 0608 10/28/17 1138  WBC 5.2 5.0 4.0  HGB 7.7* 7.8* 8.0*  HCT 23.2* 23.3* 23.5*  PLT 183 197 185   BMET Recent Labs    10/27/17 1755 10/28/17 0322 10/28/17 0608  NA 134* 137 139  K 4.7 4.5 4.6  CL 93* 101 104  CO2 26 23 25   GLUCOSE 38* 342* 268*  BUN 162* 131* 132*  CREATININE 3.79* 3.16* 2.98*  CALCIUM 9.7 8.8* 8.8*   LFT Recent Labs    10/27/17 1755  PROT 7.8  ALBUMIN 4.0  AST 27  ALT 14  ALKPHOS 45  BILITOT 0.3   PT/INR Recent Labs    10/28/17 0018  LABPROT 12.8  INR 0.97    Studies/Results: Dg Chest 2 View  Result Date: 10/27/2017 CLINICAL DATA:  Pt niece reports pt with generalized weakness x 1 week, mostly in the mornings. Niece states "she just hasn't been herself this week" pt is awake and alert, oriented x 4, denies any pain or other c/o at this time. EXAM: CHEST - 2 VIEW COMPARISON:  07/27/2008, abdominal CT on  02/21/2015 FINDINGS: Heart size is normal. The lungs are free of focal consolidations. A persistent mass is identified at the LEFT lung base, estimated to be 15 millimeters in diameter. IMPRESSION: No pulmonary edema or consolidation. Increased size of LEFT LOWER lobe nodule. Further evaluation is recommended with CT of the chest. Electronically Signed   By: Norva Pavlov M.D.   On: 10/27/2017 19:19   Mr Brain Wo Contrast  Result Date: 10/28/2017 CLINICAL DATA:  Intermittent LEFT arm weakness for 3 days. EXAM: MRI HEAD WITHOUT CONTRAST TECHNIQUE: Multiplanar, multiecho pulse sequences of the brain and surrounding structures were obtained without intravenous contrast. COMPARISON:  MR brain 07/11/2004. FINDINGS: Brain: No acute stroke, acute hemorrhage, mass lesion, or extra-axial fluid. Generalized atrophy. Hydrocephalus ex vacuo. Extensive T2 and FLAIR hyperintensities throughout the white matter, consistent with small vessel disease. Vascular: Flow voids are maintained. Skull and upper cervical spine: Partial empty sella.  Mild pannus. Sinuses/Orbits: Chronic mucosal thickening. No significant layering sinus fluid. No orbital findings. Other: Compared with 2006 there is marked progression of atrophy and small vessel disease. IMPRESSION: Atrophy and small vessel disease with marked progression since 2006. No acute intracranial findings. Electronically Signed   By: Elsie Stain M.D.   On: 10/28/2017 11:40   US Renal  Result Date: 10/28/2017 CLINICAL DATA:  Acute renal failure. EXAM: RENAL / URINARY TRACT ULTRASOUND COMPLETE COMPARISON:  CT 02/21/2015. FINDINGS: Right Kidney: Length: 10.4 cm. Cortical irregularity suggesting scarring. Echogenicity within normal limits. 1.4 cm simple cyst. No hydronephrosis visualized. Left Kidney: Length: 10.0 cm. Cortical irregularity suggesting scarring. Echogenicity within normal limits. 2.4 cm simple cyst. 1.3 cm hyperechoic solid lesion left mid kidney. This is  essentially unchanged from prior exam. No hydronephrosis visualized. Bladder: Foley catheter is in place.  No bladder distention. IMPRESSION: 1. Bilateral renal cortical irregularity suggesting scarring. No acute abnormality. No hydronephrosis or bladder distention. Foley catheter is in the bladder. The bladder is decompressed. 2. 1.3 cm hyperechoic solid lesion left mid kidney. This is  essentially unchanged from prior CT study of 02/21/2015. Electronically Signed   By: Maisie Fus  Register   On: 10/28/2017 08:31    Impression: 1. Anemia- Hb 8.3 on admission, remained stable at 7.7/7.8/8 Normocytic FOBT positive, dark stools  2. Hypovolemic shock, lactic acid 3.5 on admission 3. Acute renal failure,GFR 15,no acidosis 4. Elevated BUN/creatinine ratio of 132/2.98   Plan: Possibility of UGIB bleeding with dark stools, anemia, FOBT positive stools. Discussed about the risk and benefits of endoscopic procedure, the patient and her niece at bedside understand and agree. Patient currently on Protonix 40 mg twice a day, hemodynamically stable post fluid resuscitation, has not required blood transfusion. Will remain NPO post midnight for EGD in am.    LOS: 1 day   Kerin Salen, MD  10/28/2017, 1:06 PM  Pager [phone] If no answer or after 5 PM call [phone]

## 2017-10-29 ENCOUNTER — Encounter (HOSPITAL_COMMUNITY)
Admission: EM | Disposition: A | Discharge status: Home / Self Care | Payer: Self-pay | Source: Home / Self Care | Attending: Family Medicine

## 2017-10-29 ENCOUNTER — Inpatient Hospital Stay (HOSPITAL_COMMUNITY): Payer: Medicare HMO | Admitting: Certified Registered Nurse Anesthetist

## 2017-10-29 ENCOUNTER — Encounter (HOSPITAL_COMMUNITY): Payer: Self-pay | Admitting: Anesthesiology

## 2017-10-29 HISTORY — PX: ESOPHAGOGASTRODUODENOSCOPY (EGD) WITH PROPOFOL: SHX5813

## 2017-10-29 HISTORY — PX: BIOPSY: SHX5522

## 2017-10-29 LAB — URINE CULTURE: Culture: NO GROWTH

## 2017-10-29 LAB — GLUCOSE, CAPILLARY
GLUCOSE-CAPILLARY: 173 mg/dL — AB (ref 65–99)
GLUCOSE-CAPILLARY: 92 mg/dL (ref 65–99)
GLUCOSE-CAPILLARY: 262 mg/dL — AB (ref 65–99)
Glucose-Capillary: 234 mg/dL — ABNORMAL HIGH (ref 65–99)
Glucose-Capillary: 154 mg/dL — ABNORMAL HIGH (ref 65–99)

## 2017-10-29 SURGERY — ESOPHAGOGASTRODUODENOSCOPY (EGD) WITH PROPOFOL
Anesthesia: Monitor Anesthesia Care

## 2017-10-29 MED ORDER — ONDANSETRON HCL 4 MG/2ML IJ SOLN
INTRAMUSCULAR | Status: DC | PRN
  Administered 2017-10-29: 4 mg via INTRAVENOUS

## 2017-10-29 MED ORDER — PHENYLEPHRINE HCL 10 MG/ML IJ SOLN
INTRAMUSCULAR | Status: DC | PRN
  Administered 2017-10-29: 80 ug via INTRAVENOUS

## 2017-10-29 MED ORDER — LIDOCAINE 2% (20 MG/ML) 5 ML SYRINGE
INTRAMUSCULAR | Status: DC | PRN
  Administered 2017-10-29: 60 mg via INTRAVENOUS

## 2017-10-29 MED ORDER — SODIUM CHLORIDE 0.9 % IV SOLN
INTRAVENOUS | Status: DC | PRN
  Administered 2017-10-29: 11:00:00 via INTRAVENOUS

## 2017-10-29 MED ORDER — PROPOFOL 10 MG/ML IV BOLUS
INTRAVENOUS | Status: DC | PRN
  Administered 2017-10-29 (×2): 20 mg via INTRAVENOUS

## 2017-10-29 MED ORDER — PROPOFOL 10 MG/ML IV BOLUS
INTRAVENOUS | Status: AC
  Filled 2017-10-29: qty 40

## 2017-10-29 MED ORDER — PROPOFOL 500 MG/50ML IV EMUL
INTRAVENOUS | Status: DC | PRN
  Administered 2017-10-29: 75 ug/kg/min via INTRAVENOUS

## 2017-10-29 MED ORDER — GLIPIZIDE ER 5 MG PO TB24
5.0000 mg | ORAL_TABLET | Freq: Every day | ORAL | Status: DC
  Administered 2017-10-30 – 2017-10-31 (×2): 5 mg via ORAL
  Filled 2017-10-29 (×2): qty 1

## 2017-10-29 MED ORDER — SENNOSIDES-DOCUSATE SODIUM 8.6-50 MG PO TABS
1.0000 | ORAL_TABLET | Freq: Two times a day (BID) | ORAL | Status: DC | PRN
  Administered 2017-10-30: 1 via ORAL
  Filled 2017-10-29: qty 1

## 2017-10-29 MED ORDER — PANTOPRAZOLE SODIUM 40 MG IV SOLR
40.0000 mg | Freq: Every day | INTRAVENOUS | Status: DC
  Administered 2017-10-30 – 2017-10-31 (×2): 40 mg via INTRAVENOUS
  Filled 2017-10-29 (×3): qty 40

## 2017-10-29 SURGICAL SUPPLY — 15 items

## 2017-10-29 NOTE — Transfer of Care (Signed)
Immediate Anesthesia Transfer of Care Note  Patient: Carolyn Bishop  Procedure(s) Performed: ESOPHAGOGASTRODUODENOSCOPY (EGD) WITH PROPOFOL (N/A ) BIOPSY  Patient Location: PACU  Anesthesia Type:MAC  Level of Consciousness: awake, alert  and patient cooperative  Airway & Oxygen Therapy: Patient Spontanous Breathing and Patient connected to nasal cannula oxygen  Post-op Assessment: Report given to RN and Post -op Vital signs reviewed and stable  Post vital signs: Reviewed and stable  Last Vitals:  Vitals Value Taken Time  BP 96/40 10/29/2017 11:28 AM  Temp 36.7 C 10/29/2017 11:28 AM  Pulse 78 10/29/2017 11:28 AM  Resp 17 10/29/2017 11:28 AM  SpO2 100 % 10/29/2017 11:28 AM    Last Pain:  Vitals:   10/29/17 1128  TempSrc: Oral  PainSc: 0-No pain         Complications: No apparent anesthesia complications

## 2017-10-29 NOTE — Anesthesia Procedure Notes (Signed)
Procedure Name: MAC Date/Time: 10/29/2017 11:10 AM Performed by: West Pugh, CRNA Pre-anesthesia Checklist: Patient identified, Emergency Drugs available, Suction available, Patient being monitored and Timeout performed Patient Re-evaluated:Patient Re-evaluated prior to induction Oxygen Delivery Method: Nasal cannula Preoxygenation: Pre-oxygenation with 100% oxygen Induction Type: IV induction Number of attempts: 1 Dental Injury: Teeth and Oropharynx as per pre-operative assessment

## 2017-10-29 NOTE — Op Note (Signed)
EGD was performed for anemia, FOBT positive stool.   Findings: Normal esophagus, irregular Z line at 36 cm from incisors. Moderately erythematous antral mucosa, biopsies taken for H. Pylori. A few superficial nonbleeding gastric ulcers. Normal-appearing cardia and fundus on retroflexion. Normal duodenal bulb and rest of the duodenum.   Recommendation: PPI daily for 4 weeks. Await biopsy, treat H. Pylori if found. Regular diet.  Colonoscopy not recommended due to age, if anemia is persistent, recommend CAT scan to rule out colonic lesion.  GI will sign off, please recall if needed.   Kerin SalenArya Pravin Perezperez, M.D.

## 2017-10-29 NOTE — Brief Op Note (Signed)
10/27/2017 - 10/29/2017  11:28 AM  PATIENT:  Carolyn Bishop  84 y.o. female  PRE-OPERATIVE DIAGNOSIS:  dark stools, FOBT positive, anemia  POST-OPERATIVE DIAGNOSIS:  gastritis and gastric antrum ulcer, biopsies taken r/o H. pylori  PROCEDURE:  Procedure(s): ESOPHAGOGASTRODUODENOSCOPY (EGD) WITH PROPOFOL (N/A) BIOPSY  SURGEON:  Surgeon(s) and Role:    Ronnette Juniper, MD - Primary  PHYSICIAN ASSISTANT:   ASSISTANTS:Jennifer Zhu, RN, Charolette Child, Tech  ANESTHESIA:   MAC  EBL:  Minimal   BLOOD ADMINISTERED:none  DRAINS: none   LOCAL MEDICATIONS USED:  NONE  SPECIMEN:  Core Needle Biopsy  DISPOSITION OF SPECIMEN:  PATHOLOGY  COUNTS:  YES  TOURNIQUET:  * No tourniquets in log *  DICTATION: .Dragon Dictation  PLAN OF CARE: Admit to inpatient   PATIENT DISPOSITION:  PACU - hemodynamically stable.   Delay start of Pharmacological VTE agent (>24hrs) due to surgical blood loss or risk of bleeding: no

## 2017-10-29 NOTE — Progress Notes (Addendum)
PROGRESS NOTE    Carolyn Bishop  JYN:[id number] DOB: January 13, 1933 DOA: 10/27/2017 PCP: Andi DevonShelton, Kimberly, MD   Brief Narrative:  84 y.o. female with medical history significant of hypertension, hyperlipidemia, diabetes mellitus, who presents with generalized weakness and intermittent left arm weakness  Assessment & Plan:   Principal Problem:     Hypotension - suspected secondary to decreased intravascular volume while on lasix and Hyzaar. Improving with holding blood pressure medication and IV fluid administration. Will continue. Pressor has been discontinued. - critical care physician assisted with management. - no source of infection identified. U/A and chest x ray negative.  AKI (acute kidney injury) (HCC) - Most likely due to prerenal etiology - improving on IVF rehydration, will continue - access bmp next am, order placed.  Lactic acidosis - most likely due to transient hypotension  Active Problems:    Diabetes mellitus without complication (HCC) - Continue on diabetic diet - SSI sensitive scale    GIB (gastrointestinal bleeding) - Pt with FOBT positive.  - Will plan on transfusing if Hgb less than 8 - Consulted [facility] for unassigned patient evaluation.  Addendum: Plan is for Endoscopic evaluation today.    Lung mass - outpatient monitoring and w/u    Left arm weakness - MRI of brain    HLD (hyperlipidemia)   DVT prophylaxis: SCD's Code Status: Full Family Communication: None at bedside. Disposition Plan: transfer to med surg   Consultants:   PCCM  [facility]   Procedures: None   Antimicrobials: none   Subjective: Pt feels better. Reports no new complaints.  Objective: Vitals:   10/29/17 0000 10/29/17 0349 10/29/17 0400 10/29/17 0800  BP: 138/60  (!) 126/42 134/76  Pulse: 74  76 72  Resp: 17  15 19   Temp:  98.1 F (36.7 C)  98.2 F (36.8 C)  TempSrc:  Oral  Oral  SpO2: 100%  99% 99%  Weight:      Height:        Intake/Output Summary  (Last 24 hours) at 10/29/2017 0908 Last data filed at 10/29/2017 [id number] Gross per 24 hour  Intake 1907 ml  Output 2350 ml  Net -443 ml   Filed Weights   10/27/17 1748  Weight: 54.4 kg (120 lb)    Examination:  General exam: Appears calm and comfortable, in nad.  Respiratory system: Clear to auscultation. Respiratory effort normal. Equal chest rise. Cardiovascular system: S1 & S2 heard, RRR. No JVD, murmurs, rubs, gallops Gastrointestinal system: Abdomen is nondistended, soft and nontender. No organomegaly or masses felt. Normal bowel sounds heard. Central nervous system: Alert and awake. No facial asymmetry, moves extremities spontaneously Extremities: warm no cyanosis Skin: No rashes, lesions or ulcers, on limited exam. Psychiatry:  Mood & affect appropriate.   Data Reviewed: I have personally reviewed following labs and imaging studies  CBC: Recent Labs  Lab 10/28/17 0018 10/28/17 0322 10/28/17 0608 10/28/17 1138 10/28/17 1822  WBC 5.3 5.2 5.0 4.0 6.3  HGB 8.3* 7.7* 7.8* 8.0* 8.2*  HCT 25.4* 23.2* 23.3* 23.5* 25.0*  MCV 90.7 90.6 90.7 90.4 91.6  PLT 194 183 197 185 196   Basic Metabolic Panel: Recent Labs  Lab 10/27/17 1755 10/28/17 0322 10/28/17 0608  NA 134* 137 139  K 4.7 4.5 4.6  CL 93* 101 104  CO2 26 23 25   GLUCOSE 38* 342* 268*  BUN 162* 131* 132*  CREATININE 3.79* 3.16* 2.98*  CALCIUM 9.7 8.8* 8.8*   GFR: Estimated Creatinine Clearance: 11.8 mL/min (A) (by  C-G formula based on SCr of 2.98 mg/dL (H)). Liver Function Tests: Recent Labs  Lab 10/27/17 1755  AST 27  ALT 14  ALKPHOS 45  BILITOT 0.3  PROT 7.8  ALBUMIN 4.0   No results for input(s): LIPASE, AMYLASE in the last 168 hours. No results for input(s): AMMONIA in the last 168 hours. Coagulation Profile: Recent Labs  Lab 10/28/17 0018  INR 0.97   Cardiac Enzymes: Recent Labs  Lab 10/28/17 0608  TROPONINI <0.03   BNP (last 3 results) No results for input(s): PROBNP in the last  8760 hours. HbA1C: Recent Labs    10/28/17 0322  HGBA1C 4.8   CBG: Recent Labs  Lab 10/28/17 0755 10/28/17 1137 10/28/17 1701 10/28/17 2129 10/29/17 0831  GLUCAP 299* 259* 160* 153* 173*   Lipid Profile: Recent Labs    10/28/17 0322  CHOL 165  HDL 34*  LDLCALC 90  TRIG 161*  CHOLHDL 4.9   Thyroid Function Tests: No results for input(s): TSH, T4TOTAL, FREET4, T3FREE, THYROIDAB in the last 72 hours. Anemia Panel: Recent Labs    10/27/17 1755 10/27/17 2010  VITAMINB12  --  295  FOLATE  --  13.0  FERRITIN  --  69  TIBC  --  337  IRON  --  51  RETICCTPCT 1.1  --    Sepsis Labs: Recent Labs  Lab 10/27/17 1818 10/27/17 2013 10/28/17 0018 10/28/17 0322  PROCALCITON  --   --  0.17  --   LATICACIDVEN 1.81 2.22* 3.5* 2.5*    Recent Results (from the past 240 hour(s))  MRSA PCR Screening     Status: None   Collection Time: 10/27/17 11:25 PM  Result Value Ref Range Status   MRSA by PCR NEGATIVE NEGATIVE Final    Comment:        The GeneXpert MRSA Assay (FDA approved for NASAL specimens only), is one component of a comprehensive MRSA colonization surveillance program. It is not intended to diagnose MRSA infection nor to guide or monitor treatment for MRSA infections. Performed at [facility], [address], [place], [place] [address]       Radiology Studies: Dg Chest 2 View  Result Date: 10/27/2017 CLINICAL DATA:  Pt niece reports pt with generalized weakness x 1 week, mostly in the mornings. Niece states "she just hasn't been herself this week" pt is awake and alert, oriented x 4, denies any pain or other c/o at this time. EXAM: CHEST - 2 VIEW COMPARISON:  07/27/2008, abdominal CT on 02/21/2015 FINDINGS: Heart size is normal. The lungs are free of focal consolidations. A persistent mass is identified at the LEFT lung base, estimated to be 15 millimeters in diameter. IMPRESSION: No pulmonary edema or consolidation. Increased size of  LEFT LOWER lobe nodule. Further evaluation is recommended with CT of the chest. Electronically Signed   By: Norva Pavlov M.D.   On: 10/27/2017 19:19   Mr Brain Wo Contrast  Result Date: 10/28/2017 CLINICAL DATA:  Intermittent LEFT arm weakness for 3 days. EXAM: MRI HEAD WITHOUT CONTRAST TECHNIQUE: Multiplanar, multiecho pulse sequences of the brain and surrounding structures were obtained without intravenous contrast. COMPARISON:  MR brain 07/11/2004. FINDINGS: Brain: No acute stroke, acute hemorrhage, mass lesion, or extra-axial fluid. Generalized atrophy. Hydrocephalus ex vacuo. Extensive T2 and FLAIR hyperintensities throughout the white matter, consistent with small vessel disease. Vascular: Flow voids are maintained. Skull and upper cervical spine: Partial empty sella.  Mild pannus. Sinuses/Orbits: Chronic mucosal thickening. No significant layering  sinus fluid. No orbital findings. Other: Compared with 2006 there is marked progression of atrophy and small vessel disease. IMPRESSION: Atrophy and small vessel disease with marked progression since 2006. No acute intracranial findings. Electronically Signed   By: Elsie Stain M.D.   On: 10/28/2017 11:40   US Renal  Result Date: 10/28/2017 CLINICAL DATA:  Acute renal failure. EXAM: RENAL / URINARY TRACT ULTRASOUND COMPLETE COMPARISON:  CT 02/21/2015. FINDINGS: Right Kidney: Length: 10.4 cm. Cortical irregularity suggesting scarring. Echogenicity within normal limits. 1.4 cm simple cyst. No hydronephrosis visualized. Left Kidney: Length: 10.0 cm. Cortical irregularity suggesting scarring. Echogenicity within normal limits. 2.4 cm simple cyst. 1.3 cm hyperechoic solid lesion left mid kidney. This is essentially unchanged from prior exam. No hydronephrosis visualized. Bladder: Foley catheter is in place.  No bladder distention. IMPRESSION: 1. Bilateral renal cortical irregularity suggesting scarring. No acute abnormality. No hydronephrosis or bladder  distention. Foley catheter is in the bladder. The bladder is decompressed. 2. 1.3 cm hyperechoic solid lesion left mid kidney. This is essentially unchanged from prior CT study of 02/21/2015. Electronically Signed   By: Maisie Fus  Register   On: 10/28/2017 08:31    Scheduled Meds: . calcium-vitamin D  1 tablet Oral Daily  . insulin aspart  0-9 Units Subcutaneous TID WC  . multivitamin with minerals  1 tablet Oral Daily  . pantoprazole  40 mg Intravenous Q12H   Continuous Infusions: . dextrose 5 % and 0.45% NaCl 100 mL/hr at 10/29/17 0849  . phenylephrine (NEO-SYNEPHRINE) Adult infusion Stopped (10/28/17 0928)     LOS: 2 days    Time spent: 36 min  Penny Pia, MD [facility] Pager [phone]  If 7PM-7AM, please contact night-coverage [email or web address] Password [facility] 10/29/2017, 9:08 AM

## 2017-10-29 NOTE — Interval H&P Note (Signed)
History and Physical Interval Note: 85/female with anemia and FOBT positive stool for diagnostic EGD.  10/29/2017 10:59 AM  Carolyn Bishop  has presented today for EGD, with the diagnosis of dark stools, FOBT positive, anemia  The various methods of treatment have been discussed with the patient and family. After consideration of risks, benefits and other options for treatment, the patient has consented to  Procedure(s): ESOPHAGOGASTRODUODENOSCOPY (EGD) WITH PROPOFOL (N/A) as a surgical intervention .  The patient's history has been reviewed, patient examined, no change in status, stable for surgery.  I have reviewed the patient's chart and labs.  Questions were answered to the patient's satisfaction.     Kerin SalenArya Bram Hottel

## 2017-10-29 NOTE — Progress Notes (Signed)
Patient to transfer to 6E 1618 report given to receiving nurse, all questions answered at this time.  Pt. VSS with no s/s of distress noted.  Patient being transported to EGD will directly go to 1618 post procedure.

## 2017-10-29 NOTE — Anesthesia Postprocedure Evaluation (Signed)
Anesthesia Post Note  Patient: Carolyn Bishop  Procedure(s) Performed: ESOPHAGOGASTRODUODENOSCOPY (EGD) WITH PROPOFOL (N/A ) BIOPSY     Patient location during evaluation: PACU Anesthesia Type: MAC Level of consciousness: awake and alert and oriented Pain management: pain level controlled Vital Signs Assessment: post-procedure vital signs reviewed and stable Respiratory status: spontaneous breathing, nonlabored ventilation, respiratory function stable and patient connected to nasal cannula oxygen Cardiovascular status: stable and blood pressure returned to baseline Postop Assessment: no apparent nausea or vomiting Anesthetic complications: no    Last Vitals:  Vitals:   10/29/17 1140 10/29/17 1206  BP: (!) 90/42 (!) 104/54  Pulse: 75 67  Resp: 14 16  Temp:  36.8 C  SpO2: 100% 100%    Last Pain:  Vitals:   10/29/17 1206  TempSrc: Oral  PainSc:                  [name] A.

## 2017-10-29 NOTE — Anesthesia Preprocedure Evaluation (Addendum)
Anesthesia Evaluation  Patient identified by MRN, date of birth, ID band Patient awake    Reviewed: Allergy & Precautions, NPO status , Patient's Chart, lab work & pertinent test results  Airway Mallampati: II  TM Distance: >3 FB Neck ROM: Full    Dental no notable dental hx.    Pulmonary  72m LLL pulmonary nodule   Pulmonary exam normal breath sounds clear to auscultation       Cardiovascular hypertension, Pt. on medications Normal cardiovascular exam Rhythm:Regular Rate:Normal  EKG 10/27/2017- NSR, 1st deg AV Block, no acute changes  Echo 10/28/2017-Left ventricle: The cavity size was normal. Wall thickness was  normal. Systolic function was vigorous. The estimated ejection  fraction was in the range of 65% to 70%. Wall motion was normal;  there were no regional wall motion abnormalities. Doppler  parameters are consistent with abnormal left ventricular  relaxation (grade 1 diastolic dysfunction).   Neuro/Psych Left arm weakness negative psych ROS   GI/Hepatic Neg liver ROS, GI bleed + FOBT   Endo/Other  diabetes, Poorly Controlled, Type 2, Oral Hypoglycemic AgentsHyperlipidemia  Renal/GU ARF and Renal InsufficiencyRenal diseaseAKI  negative genitourinary   Musculoskeletal negative musculoskeletal ROS (+)   Abdominal   Peds  Hematology  (+) anemia , Lactic acidosis Sepsis syndrome with hypotension- resolved   Anesthesia Other Findings   Reproductive/Obstetrics                           Anesthesia Physical Anesthesia Plan  ASA: III  Anesthesia Plan: MAC   Post-op Pain Management:    Induction: Intravenous  PONV Risk Score and Plan: 3 and Ondansetron, Propofol infusion and Treatment may vary due to age or medical condition  Airway Management Planned: Natural Airway and Simple Face Mask  Additional Equipment:   Intra-op Plan:   Post-operative Plan:   Informed Consent: I have  reviewed the patients History and Physical, chart, labs and discussed the procedure including the risks, benefits and alternatives for the proposed anesthesia with the patient or authorized representative who has indicated his/her understanding and acceptance.   Dental advisory given  Plan Discussed with: CRNA  Anesthesia Plan Comments:         Anesthesia Quick Evaluation

## 2017-10-29 NOTE — Op Note (Signed)
[facility] Patient Name: Carolyn FilbertRamona Vanbergen Procedure Date: 10/29/2017 MRN: [id number] Attending MD: Kerin SalenArya Sarath Privott , MD Date of Birth: 1932/08/27 CSN: [id number] Age: 84 Admit Type: Inpatient Procedure:                Upper GI endoscopy Indications:              Unexplained iron deficiency anemia, Occult blood in                            stool Providers:                Kerin SalenArya Annajulia Lewing, MD, Jacquiline DoeJennifer Zhu, RN, Verita SchneidersPrasun Sharma,                            Technician, Kym GroomKaren Walker, CRNA Referring MD:              Medicines:                Monitored Anesthesia Care Complications:            No immediate complications. Estimated Blood Loss:     Estimated blood loss was minimal. Procedure:                Pre-Anesthesia Assessment:                           - Prior to the procedure, a History and Physical                            was performed, and patient medications and                            allergies were reviewed. The patient's tolerance of                            previous anesthesia was also reviewed. The risks                            and benefits of the procedure and the sedation                            options and risks were discussed with the patient.                            All questions were answered, and informed consent                            was obtained. Prior Anticoagulants: The patient has                            taken no previous anticoagulant or antiplatelet                            agents. ASA Grade Assessment: III - A patient with  severe systemic disease. After reviewing the risks                            and benefits, the patient was deemed in                            satisfactory condition to undergo the procedure.                           After obtaining informed consent, the endoscope was                            passed under direct vision. Throughout the                            procedure, the patient's  blood pressure, pulse, and                            oxygen saturations were monitored continuously. The                            [id number]-[id number] ([id number]) scope was introduced through the                            mouth, and advanced to the second part of duodenum.                            The upper GI endoscopy was accomplished without                            difficulty. The patient tolerated the procedure                            well. Scope In: Scope Out: Findings:      The examined esophagus was normal.      The Z-line was regular and was found 36 cm from the incisors.      Localized moderately erythematous mucosa without bleeding was found in       the gastric antrum. Biopsies were taken with a cold forceps for       Helicobacter pylori testing.      The cardia and gastric fundus were normal on retroflexion.      Two non-bleeding superficial gastric ulcers with a clean ulcer base       (Forrest Class III) were found in the gastric antrum. The largest lesion       was 3 mm in largest dimension.      The examined duodenum was normal. Impression:               - Normal esophagus.                           - Z-line regular, 36 cm from the incisors.                           - Erythematous mucosa in the antrum. Biopsied.                           -  Non-bleeding gastric ulcers with a clean ulcer                            base (Forrest Class III).                           - Normal examined duodenum. Moderate Sedation:      Patient did not receive moderate sedation for this procedure, but       instead received monitored anesthesia care. Recommendation:           - Resume regular diet.                           - Use Protonix (pantoprazole) 40 mg PO daily for 4                            weeks. Procedure Code(s):        --- Professional ---                           [phone], Esophagogastroduodenoscopy, flexible,                            transoral; with biopsy, single or  multiple Diagnosis Code(s):        --- Professional ---                           K31.89, Other diseases of stomach and duodenum                           K25.9, Gastric ulcer, unspecified as acute or                            chronic, without hemorrhage or perforation                           D50.9, Iron deficiency anemia, unspecified                           R19.5, Other fecal abnormalities CPT copyright 2017 [employer]. All rights reserved. The codes documented in this report are preliminary and upon coder review may  be revised to meet current compliance requirements. Kerin Salen, MD 10/29/2017 11:27:16 AM This report has been signed electronically. Number of Addenda: 0

## 2017-10-30 LAB — CBC
HEMATOCRIT: 21.9 % — AB (ref 36.0–46.0)
Hemoglobin: 7.2 g/dL — ABNORMAL LOW (ref 12.0–15.0)
MCH: 30.4 pg (ref 26.0–34.0)
MCHC: 32.9 g/dL (ref 30.0–36.0)
MCV: 92.4 fL (ref 78.0–100.0)
Platelets: 173 10*3/uL (ref 150–400)
RBC: 2.37 MIL/uL — ABNORMAL LOW (ref 3.87–5.11)
RDW: 12.9 % (ref 11.5–15.5)
WBC: 5.1 10*3/uL (ref 4.0–10.5)

## 2017-10-30 LAB — BASIC METABOLIC PANEL
Anion gap: 9 (ref 5–15)
BUN: 54 mg/dL — AB (ref 6–20)
CO2: 25 mmol/L (ref 22–32)
Calcium: 8.5 mg/dL — ABNORMAL LOW (ref 8.9–10.3)
Chloride: 107 mmol/L (ref 101–111)
Creatinine, Ser: 1.73 mg/dL — ABNORMAL HIGH (ref 0.44–1.00)
GFR calc Af Amer: 30 mL/min — ABNORMAL LOW (ref >60)
GFR, EST NON AFRICAN AMERICAN: 26 mL/min — AB (ref >60)
GLUCOSE: 360 mg/dL — AB (ref 65–99)
POTASSIUM: 3.7 mmol/L (ref 3.5–5.1)
Sodium: 141 mmol/L (ref 135–145)

## 2017-10-30 LAB — GLUCOSE, CAPILLARY
GLUCOSE-CAPILLARY: 259 mg/dL — AB (ref 65–99)
GLUCOSE-CAPILLARY: 323 mg/dL — AB (ref 65–99)
Glucose-Capillary: 131 mg/dL — ABNORMAL HIGH (ref 65–99)
Glucose-Capillary: 85 mg/dL (ref 65–99)

## 2017-10-30 LAB — HEMOGLOBIN AND HEMATOCRIT, BLOOD
HCT: 28.8 % — ABNORMAL LOW (ref 36.0–46.0)
Hemoglobin: 9.6 g/dL — ABNORMAL LOW (ref 12.0–15.0)

## 2017-10-30 LAB — PREPARE RBC (CROSSMATCH)

## 2017-10-30 MED ORDER — SODIUM CHLORIDE 0.9% IV SOLUTION
Freq: Once | INTRAVENOUS | Status: AC
  Administered 2017-10-30: 14:00:00 via INTRAVENOUS

## 2017-10-30 NOTE — Progress Notes (Signed)
PROGRESS NOTE    Carolyn Bishop  ZOX:[id number] DOB: Mar 17, 1933 DOA: 10/27/2017 PCP: Andi Devon, MD   Brief Narrative:  84 y.o. female with medical history significant of hypertension, hyperlipidemia, diabetes mellitus, who presents with generalized weakness and intermittent left arm weakness  Assessment & Plan:   Principal Problem:     Hypotension - suspected secondary to decreased intravascular volume while on lasix and Hyzaar. Improving with holding blood pressure medication and IV fluid administration. Will continue. Pressor has been discontinued. - Critical care physician assisted with management. - no source of infection identified. U/A and chest x ray negative. - resolving with IVF rehydration and improved oral intake.  AKI (acute kidney injury) (HCC) - Most likely due to prerenal etiology - improving on IVF rehydration, will continue - access bmp next am, order placed.  Lactic acidosis - most likely due to transient hypotension - resolved  Active Problems:    Diabetes mellitus without complication (HCC) - Continue on diabetic diet - SSI sensitive scale    GIB (gastrointestinal bleeding) - Pt with FOBT positive.  - Will plan on transfusing if Hgb less than 8, transfusing - Consulted [facility] PPI for 4 wks after endoscopic evaluation and f/u on H pylori testing by GI.    Lung mass - outpatient monitoring and w/u    Left arm weakness - MRI of brain    HLD (hyperlipidemia)   DVT prophylaxis: SCD's Code Status: Full Family Communication: None at bedside. Disposition Plan: transfer to med surg   Consultants:   PCCM  [facility]   Procedures: None   Antimicrobials: none   Subjective: Pt feels better. Reports no new complaints.  Objective: Vitals:   10/29/17 1410 10/29/17 2031 10/29/17 2053 10/30/17 0534  BP: (!) 114/50 (!) 103/49  (!) 95/47  Pulse: 75 78  73  Resp: 16 16  16   Temp: 97.8 F (36.6 C) 98.4 F (36.9 C)  97.7 F (36.5 C)    TempSrc: Oral Oral  Oral  SpO2: 100% (!) 82% 100% 100%  Weight:      Height:        Intake/Output Summary (Last 24 hours) at 10/30/2017 1242 Last data filed at 10/30/2017 1045 Gross per 24 hour  Intake 2158.33 ml  Output 900 ml  Net 1258.33 ml   Filed Weights   10/27/17 1748  Weight: 54.4 kg (120 lb)    Examination:  General exam: Pt in nad, Appears calm and comfortable Cardiovascular system: S1 & S2 heard, RRR. No JVD, murmurs, rubs, gallops Respiratory system: Clear to auscultation. Respiratory effort normal. Equal chest rise. Gastrointestinal system: Abdomen is nondistended, soft and nontender. No organomegaly or masses felt. Normal bowel sounds heard. Extremities: warm no cyanosis Central nervous system: Alert and awake. No facial asymmetry, moves extremities spontaneously Skin: No rashes, lesions or ulcers, on limited exam. Psychiatry:  Mood & affect appropriate.   Data Reviewed: I have personally reviewed following labs and imaging studies  CBC: Recent Labs  Lab 10/28/17 0322 10/28/17 0608 10/28/17 1138 10/28/17 1822 10/30/17 0437  WBC 5.2 5.0 4.0 6.3 5.1  HGB 7.7* 7.8* 8.0* 8.2* 7.2*  HCT 23.2* 23.3* 23.5* 25.0* 21.9*  MCV 90.6 90.7 90.4 91.6 92.4  PLT 183 197 185 196 173   Basic Metabolic Panel: Recent Labs  Lab 10/27/17 1755 10/28/17 0322 10/28/17 0608 10/30/17 0437  NA 134* 137 139 141  K 4.7 4.5 4.6 3.7  CL 93* 101 104 107  CO2 26 23 25 25   GLUCOSE  38* 342* 268* 360*  BUN 162* 131* 132* 54*  CREATININE 3.79* 3.16* 2.98* 1.73*  CALCIUM 9.7 8.8* 8.8* 8.5*   GFR: Estimated Creatinine Clearance: 20.4 mL/min (A) (by C-G formula based on SCr of 1.73 mg/dL (H)). Liver Function Tests: Recent Labs  Lab 10/27/17 1755  AST 27  ALT 14  ALKPHOS 45  BILITOT 0.3  PROT 7.8  ALBUMIN 4.0   No results for input(s): LIPASE, AMYLASE in the last 168 hours. No results for input(s): AMMONIA in the last 168 hours. Coagulation Profile: Recent Labs  Lab  10/28/17 0018  INR 0.97   Cardiac Enzymes: Recent Labs  Lab 10/28/17 0608  TROPONINI <0.03   BNP (last 3 results) No results for input(s): PROBNP in the last 8760 hours. HbA1C: Recent Labs    10/28/17 0322  HGBA1C 4.8   CBG: Recent Labs  Lab 10/29/17 1718 10/29/17 1719 10/29/17 2010 10/30/17 0243 10/30/17 0819  GLUCAP 262* 234* 154* 259* 323*   Lipid Profile: Recent Labs    10/28/17 0322  CHOL 165  HDL 34*  LDLCALC 90  TRIG 220206*  CHOLHDL 4.9   Thyroid Function Tests: No results for input(s): TSH, T4TOTAL, FREET4, T3FREE, THYROIDAB in the last 72 hours. Anemia Panel: Recent Labs    10/27/17 1755 10/27/17 2010  VITAMINB12  --  295  FOLATE  --  13.0  FERRITIN  --  69  TIBC  --  337  IRON  --  51  RETICCTPCT 1.1  --    Sepsis Labs: Recent Labs  Lab 10/27/17 1818 10/27/17 2013 10/28/17 0018 10/28/17 0322  PROCALCITON  --   --  0.17  --   LATICACIDVEN 1.81 2.22* 3.5* 2.5*    Recent Results (from the past 240 hour(s))  Blood culture (routine x 2)     Status: None (Preliminary result)   Collection Time: 10/27/17  6:10 PM  Result Value Ref Range Status   Specimen Description   Final    BLOOD RIGHT ANTECUBITAL Performed at [facility] [place], [address] [place], [place] [address]    Special Requests   Final    BOTTLES DRAWN AEROBIC AND ANAEROBIC Blood Culture adequate volume Performed at [facility] [place], [address] [place], [place] [address]    Culture   Final    NO GROWTH 1 DAY Performed at [facility], [address] [place], [place] [address]    Report Status PENDING  Incomplete  Urine culture     Status: None   Collection Time: 10/27/17  6:26 PM  Result Value Ref Range Status   Specimen Description   Final    URINE, CATHETERIZED Performed at [facility] [place], [address] [place], [place] [address]    Special Requests   Final    NONE Performed at [facility] [place], [address]  [place], [place] [address]    Culture   Final    NO GROWTH Performed at [facility], [address] [place], [place] [address]    Report Status 10/29/2017 FINAL  Final  Blood culture (routine x 2)     Status: None (Preliminary result)   Collection Time: 10/27/17 10:20 PM  Result Value Ref Range Status   Specimen Description   Final    BLOOD LEFT ANTECUBITAL Performed at [facility] [place], [address] [place], [place] [address]    Special Requests   Final  BOTTLES DRAWN AEROBIC AND ANAEROBIC Blood Culture adequate volume Performed at [facility], [address] [place], [place] [address]    Culture   Final    NO GROWTH 1 DAY Performed at [facility], [address], [place], [place] [address]    Report Status PENDING  Incomplete  MRSA PCR Screening     Status: None   Collection Time: 10/27/17 11:25 PM  Result Value Ref Range Status   MRSA by PCR NEGATIVE NEGATIVE Final    Comment:        The GeneXpert MRSA Assay (FDA approved for NASAL specimens only), is one component of a comprehensive MRSA colonization surveillance program. It is not intended to diagnose MRSA infection nor to guide or monitor treatment for MRSA infections. Performed at [facility], [address] [place], [place] [address]       Radiology Studies: No results found.  Scheduled Meds: . sodium chloride   Intravenous Once  . calcium-vitamin D  1 tablet Oral Daily  . glipiZIDE  5 mg Oral Q breakfast  . insulin aspart  0-9 Units Subcutaneous TID WC  . multivitamin with minerals  1 tablet Oral Daily  . pantoprazole  40 mg Intravenous Daily   Continuous Infusions: . dextrose 5 % and 0.45% NaCl 100 mL/hr at 10/30/17 0848  . phenylephrine (NEO-SYNEPHRINE) Adult infusion Stopped (10/28/17 0928)     LOS: 3 days    Time spent: 36 min  Penny Pia, MD [facility] Pager [phone]  If 7PM-7AM, please contact  night-coverage [email or web address] Password [facility] 10/30/2017, 12:42 PM

## 2017-10-31 ENCOUNTER — Encounter (HOSPITAL_COMMUNITY): Payer: Self-pay | Admitting: Gastroenterology

## 2017-10-31 LAB — GLUCOSE, CAPILLARY
GLUCOSE-CAPILLARY: 143 mg/dL — AB (ref 65–99)
GLUCOSE-CAPILLARY: 206 mg/dL — AB (ref 65–99)
Glucose-Capillary: 109 mg/dL — ABNORMAL HIGH (ref 65–99)

## 2017-10-31 LAB — TYPE AND SCREEN
ABO/RH(D): AB POS
Antibody Screen: NEGATIVE
UNIT DIVISION: 0

## 2017-10-31 LAB — BPAM RBC
Blood Product Expiration Date: 201907132359
ISSUE DATE / TIME: 201906231355
Unit Type and Rh: 6200

## 2017-10-31 MED ORDER — PANTOPRAZOLE SODIUM 40 MG IV SOLR: 40.0000 mg | Freq: Every day | INTRAVENOUS | Status: DC

## 2017-10-31 MED ORDER — PANTOPRAZOLE SODIUM 40 MG PO TBEC: 40.0000 mg | DELAYED_RELEASE_TABLET | Freq: Every day | ORAL | 0 refills | Status: DC

## 2017-10-31 MED ORDER — ADULT MULTIVITAMIN W/MINERALS CH: 1.0000 | ORAL_TABLET | Freq: Every day | ORAL | Status: DC

## 2017-10-31 NOTE — Care Management Important Message (Signed)
Important Message  Patient Details  Name: Carolyn Bishop MRN: [id number] Date of Birth: 1933/01/02   Medicare Important Message Given:  Yes    Caren MacadamFuller, Keyira Mondesir 10/31/2017, 10:39 AMImportant Message  Patient Details  Name: Carolyn Bishop MRN: [id number] Date of Birth: 1933/01/02   Medicare Important Message Given:  Yes    Caren MacadamFuller, Tanga Gloor 10/31/2017, 10:39 AM

## 2017-10-31 NOTE — Discharge Summary (Signed)
Physician Discharge Summary  Carolyn Bishop ZOX:[id number] DOB: October 31, 1932 DOA: 10/27/2017  PCP: Andi Devon, MD  Admit date: 10/27/2017 Discharge date: 10/31/2017  Time spent: 36 minutes  Recommendations for Outpatient Follow-up:  1. Please f/u with Dr. Marca Ancona with GI for biopsy results 2. Also please decide when to continue antihypertensive medications.    Discharge Diagnoses:  Principal Problem:   AKI (acute kidney injury) (HCC) Active Problems:   Hypotension   Diabetes mellitus without complication (HCC)   Essential hypertension   Hypoglycemia   Normocytic anemia   GIB (gastrointestinal bleeding)   Lung mass   Lactic acid acidosis   Left arm weakness   HLD (hyperlipidemia)   Acute urinary retention   Discharge Condition: stable  Diet recommendation: regular diet  Filed Weights   10/27/17 1748  Weight: 54.4 kg (120 lb)    History of present illness:  84 y.o.femalewith medical history significant ofhypertension, hyperlipidemia, diabetes mellitus, who presents with generalized weakness and intermittent left arm weakness.  Hospital Course:  Principal Problem:     Hypotension - suspected secondary to decreased intravascular volume while on lasix and Hyzaar.  Resolved with IV fluid rehydration and discontinuation of antihypertensive medication. - Critical care physician assisted with management  While patient required pressors - no source of infection identified. U/A and chest x ray negative.  AKI (acute kidney injury) (HCC) - Most likely due to prerenal etiology - improving on IVF rehydration, suspect continued improvement with improved oral intake and cessation of antihypertensive medication.  Lactic acidosis - most likely due to transient hypotension - resolved  Active Problems:    Diabetes mellitus without complication (HCC) - Continue on diabetic diet - SSI sensitive scale    GIB (gastrointestinal bleeding) - Pt with FOBT positive.  - s/p  transfusion of 1 unit of PRBC - Consulted [facility] PPI for 4 wks after endoscopic evaluation and f/u on H pylori testing by GI.    Lung mass - outpatient monitoring and w/u    Left arm weakness - MRI of brain reported no acute intracranial findings.    HLD (hyperlipidemia)  Procedures:  Upper endoscopy - found to have gastritis and gastric antrum ulcer, biopsy obtained to assess for H. pylori   Consultations:  Gastroenterology: Dr. Marca Ancona  Discharge Exam: Vitals:   10/30/17 2139 10/31/17 0555  BP: (!) 141/55 126/60  Pulse: 70 76  Resp: 18 18  Temp: 97.8 F (36.6 C) 99.1 F (37.3 C)  SpO2: 96% 100%    General: Pt in nad, alert and awake Cardiovascular: rrr, no rubs Respiratory: no increased wob, no wheezes  Discharge Instructions   Discharge Instructions    Call MD for:  severe uncontrolled pain   Complete by:  As directed    Call MD for:  temperature >100.4   Complete by:  As directed    Diet - low sodium heart healthy   Complete by:  As directed    Discharge instructions   Complete by:  As directed    Please decide when to continue antihypertensive medications.  Patient required hospitalization and pressors for her blood pressure.  Blood pressures have been normal off of the antihypertensives as such we will discontinue on discharge.   Increase activity slowly   Complete by:  As directed      Allergies as of 10/31/2017   No Known Allergies     Medication List    STOP taking these medications   furosemide 40 MG tablet Commonly known as:  LASIX  losartan-hydrochlorothiazide 100-25 MG tablet Commonly known as:  HYZAAR   metFORMIN 500 MG tablet Commonly known as:  GLUCOPHAGE     TAKE these medications   acetaminophen 500 MG tablet Commonly known as:  TYLENOL Take 1,000 mg by mouth daily as needed (pain).   glipiZIDE 5 MG 24 hr tablet Commonly known as:  GLUCOTROL XL Take 1 tablet by mouth daily.   multivitamin with minerals Tabs  tablet Take 1 tablet by mouth daily.   pantoprazole 40 MG tablet Commonly known as:  PROTONIX Take 1 tablet (40 mg total) by mouth daily.      No Known Allergies Follow-up Information    Andi Devon, MD.   Specialty:  Internal Medicine Contact information: [address] [place] [address] [phone]            The results of significant diagnostics from this hospitalization (including imaging, microbiology, ancillary and laboratory) are listed below for reference.    Significant Diagnostic Studies: Dg Chest 2 View  Result Date: 10/27/2017 CLINICAL DATA:  Pt niece reports pt with generalized weakness x 1 week, mostly in the mornings. Niece states "she just hasn't been herself this week" pt is awake and alert, oriented x 4, denies any pain or other c/o at this time. EXAM: CHEST - 2 VIEW COMPARISON:  07/27/2008, abdominal CT on 02/21/2015 FINDINGS: Heart size is normal. The lungs are free of focal consolidations. A persistent mass is identified at the LEFT lung base, estimated to be 15 millimeters in diameter. IMPRESSION: No pulmonary edema or consolidation. Increased size of LEFT LOWER lobe nodule. Further evaluation is recommended with CT of the chest. Electronically Signed   By: Norva Pavlov M.D.   On: 10/27/2017 19:19   Mr Brain Wo Contrast  Result Date: 10/28/2017 CLINICAL DATA:  Intermittent LEFT arm weakness for 3 days. EXAM: MRI HEAD WITHOUT CONTRAST TECHNIQUE: Multiplanar, multiecho pulse sequences of the brain and surrounding structures were obtained without intravenous contrast. COMPARISON:  MR brain 07/11/2004. FINDINGS: Brain: No acute stroke, acute hemorrhage, mass lesion, or extra-axial fluid. Generalized atrophy. Hydrocephalus ex vacuo. Extensive T2 and FLAIR hyperintensities throughout the white matter, consistent with small vessel disease. Vascular: Flow voids are maintained. Skull and upper cervical spine: Partial empty sella.  Mild  pannus. Sinuses/Orbits: Chronic mucosal thickening. No significant layering sinus fluid. No orbital findings. Other: Compared with 2006 there is marked progression of atrophy and small vessel disease. IMPRESSION: Atrophy and small vessel disease with marked progression since 2006. No acute intracranial findings. Electronically Signed   By: Elsie Stain M.D.   On: 10/28/2017 11:40   US Renal  Result Date: 10/28/2017 CLINICAL DATA:  Acute renal failure. EXAM: RENAL / URINARY TRACT ULTRASOUND COMPLETE COMPARISON:  CT 02/21/2015. FINDINGS: Right Kidney: Length: 10.4 cm. Cortical irregularity suggesting scarring. Echogenicity within normal limits. 1.4 cm simple cyst. No hydronephrosis visualized. Left Kidney: Length: 10.0 cm. Cortical irregularity suggesting scarring. Echogenicity within normal limits. 2.4 cm simple cyst. 1.3 cm hyperechoic solid lesion left mid kidney. This is essentially unchanged from prior exam. No hydronephrosis visualized. Bladder: Foley catheter is in place.  No bladder distention. IMPRESSION: 1. Bilateral renal cortical irregularity suggesting scarring. No acute abnormality. No hydronephrosis or bladder distention. Foley catheter is in the bladder. The bladder is decompressed. 2. 1.3 cm hyperechoic solid lesion left mid kidney. This is essentially unchanged from prior CT study of 02/21/2015. Electronically Signed   By: Maisie Fus  Register   On: 10/28/2017 08:31    Microbiology:  Recent Results (from the past 240 hour(s))  Blood culture (routine x 2)     Status: None (Preliminary result)   Collection Time: 10/27/17  6:10 PM  Result Value Ref Range Status   Specimen Description   Final    BLOOD RIGHT ANTECUBITAL Performed at [facility] [place], [address] [place], [place] [address]    Special Requests   Final    BOTTLES DRAWN AEROBIC AND ANAEROBIC Blood Culture adequate volume Performed at [facility] [place], [address] [place], [place] [address]    Culture    Final    NO GROWTH 3 DAYS Performed at [facility], [address] [place], [place] [address]    Report Status PENDING  Incomplete  Urine culture     Status: None   Collection Time: 10/27/17  6:26 PM  Result Value Ref Range Status   Specimen Description   Final    URINE, CATHETERIZED Performed at [facility] [place], [address] [place], [place] [address]    Special Requests   Final    NONE Performed at [facility] [place], [address] [place], [place] [address]    Culture   Final    NO GROWTH Performed at [facility], [address] [place], [place] [address]    Report Status 10/29/2017 FINAL  Final  Blood culture (routine x 2)     Status: None (Preliminary result)   Collection Time: 10/27/17 10:20 PM  Result Value Ref Range Status   Specimen Description   Final    BLOOD LEFT ANTECUBITAL Performed at [facility] [place], [address] [place], [place] [address]    Special Requests   Final    BOTTLES DRAWN AEROBIC AND ANAEROBIC Blood Culture adequate volume Performed at [facility] [place], [address] [place], [place] [address]    Culture   Final    NO GROWTH 3 DAYS Performed at [facility], [address] [place], [place] [address]    Report Status PENDING  Incomplete  MRSA PCR Screening     Status: None   Collection Time: 10/27/17 11:25 PM  Result Value Ref Range Status   MRSA by PCR NEGATIVE NEGATIVE Final    Comment:        The GeneXpert MRSA Assay (FDA approved for NASAL specimens only), is one component of a comprehensive MRSA colonization surveillance program. It is not intended to diagnose MRSA infection nor to guide or monitor treatment for MRSA infections. Performed at [facility], [address] [place], [place] [address]      Labs: Basic Metabolic Panel: Recent Labs  Lab 10/27/17 1755 10/28/17 0322 10/28/17 0608 10/30/17 0437  NA 134* 137 139 141  K 4.7 4.5 4.6 3.7   CL 93* 101 104 107  CO2 26 23 25 25   GLUCOSE 38* 342* 268* 360*  BUN 162* 131* 132* 54*  CREATININE 3.79* 3.16* 2.98* 1.73*  CALCIUM 9.7 8.8* 8.8* 8.5*   Liver Function Tests: Recent Labs  Lab 10/27/17 1755  AST 27  ALT 14  ALKPHOS 45  BILITOT 0.3  PROT 7.8  ALBUMIN 4.0   No results for input(s): LIPASE, AMYLASE in the last 168 hours. No results for input(s): AMMONIA in the last 168 hours. CBC: Recent Labs  Lab 10/28/17 0322 10/28/17 0608 10/28/17 1138 10/28/17 1822 10/30/17 0437 10/30/17 1908  WBC 5.2 5.0 4.0  6.3 5.1  --   HGB 7.7* 7.8* 8.0* 8.2* 7.2* 9.6*  HCT 23.2* 23.3* 23.5* 25.0* 21.9* 28.8*  MCV 90.6 90.7 90.4 91.6 92.4  --   PLT 183 197 185 196 173  --    Cardiac Enzymes: Recent Labs  Lab 10/28/17 0608  TROPONINI <0.03   BNP: BNP (last 3 results) No results for input(s): BNP in the last 8760 hours.  ProBNP (last 3 results) No results for input(s): PROBNP in the last 8760 hours.  CBG: Recent Labs  Lab 10/30/17 1234 10/30/17 2014 10/31/17 0018 10/31/17 0741 10/31/17 1205  GLUCAP 131* 85 109* 143* 206*       Signed:  Penny Pia MD.  [facility] 10/31/2017, 2:00 PM

## 2017-10-31 NOTE — Progress Notes (Signed)
Pt d/c'd home with her niece. Both verbalized understanding of d/c orders

## 2017-11-01 LAB — GLUCOSE, CAPILLARY: Glucose-Capillary: 88 mg/dL (ref 70–99)

## 2017-11-02 LAB — CULTURE, BLOOD (ROUTINE X 2)
CULTURE: NO GROWTH
Culture: NO GROWTH
SPECIAL REQUESTS: ADEQUATE
Special Requests: ADEQUATE

## 2017-11-07 ENCOUNTER — Other Ambulatory Visit: Payer: Self-pay

## 2017-11-07 ENCOUNTER — Emergency Department (HOSPITAL_COMMUNITY)
Admission: EM | Admit: 2017-11-07 | Discharge: 2017-11-08 | Disposition: A | Discharge status: Home / Self Care | Payer: Medicare HMO | Attending: Emergency Medicine | Admitting: Emergency Medicine

## 2017-11-07 ENCOUNTER — Emergency Department (HOSPITAL_COMMUNITY): Payer: Medicare HMO

## 2017-11-07 DIAGNOSIS — R531 Weakness: Secondary | ICD-10-CM

## 2017-11-07 DIAGNOSIS — E119 Type 2 diabetes mellitus without complications: Secondary | ICD-10-CM | POA: Diagnosis not present

## 2017-11-07 DIAGNOSIS — I10 Essential (primary) hypertension: Secondary | ICD-10-CM | POA: Insufficient documentation

## 2017-11-07 LAB — COMPREHENSIVE METABOLIC PANEL
ALT: 12 U/L (ref 0–44)
AST: 17 U/L (ref 15–41)
Albumin: 3.1 g/dL — ABNORMAL LOW (ref 3.5–5.0)
Alkaline Phosphatase: 52 U/L (ref 38–126)
Anion gap: 10 (ref 5–15)
BILIRUBIN TOTAL: 0.5 mg/dL (ref 0.3–1.2)
BUN: 34 mg/dL — AB (ref 8–23)
CO2: 24 mmol/L (ref 22–32)
Calcium: 9.5 mg/dL (ref 8.9–10.3)
Chloride: 113 mmol/L — ABNORMAL HIGH (ref 98–111)
Creatinine, Ser: 1.24 mg/dL — ABNORMAL HIGH (ref 0.44–1.00)
GFR calc Af Amer: 45 mL/min — ABNORMAL LOW (ref >60)
GFR, EST NON AFRICAN AMERICAN: 39 mL/min — AB (ref >60)
Glucose, Bld: 95 mg/dL (ref 70–99)
Potassium: 3.9 mmol/L (ref 3.5–5.1)
Sodium: 147 mmol/L — ABNORMAL HIGH (ref 135–145)
TOTAL PROTEIN: 7.2 g/dL (ref 6.5–8.1)

## 2017-11-07 LAB — URINALYSIS, ROUTINE W REFLEX MICROSCOPIC
BILIRUBIN URINE: NEGATIVE
GLUCOSE, UA: NEGATIVE mg/dL
HGB URINE DIPSTICK: NEGATIVE
Ketones, ur: NEGATIVE mg/dL
NITRITE: NEGATIVE
PROTEIN: NEGATIVE mg/dL
Specific Gravity, Urine: 1.015 (ref 1.005–1.030)
pH: 5 (ref 5.0–8.0)

## 2017-11-07 LAB — CBC WITH DIFFERENTIAL/PLATELET
BASOS ABS: 0 10*3/uL (ref 0.0–0.1)
BASOS PCT: 0 %
Eosinophils Absolute: 0.1 10*3/uL (ref 0.0–0.7)
Eosinophils Relative: 1 %
HEMATOCRIT: 27.8 % — AB (ref 36.0–46.0)
HEMOGLOBIN: 9.2 g/dL — AB (ref 12.0–15.0)
Lymphocytes Relative: 15 %
Lymphs Abs: 1.1 10*3/uL (ref 0.7–4.0)
MCH: 30 pg (ref 26.0–34.0)
MCHC: 33.1 g/dL (ref 30.0–36.0)
MCV: 90.6 fL (ref 78.0–100.0)
MONO ABS: 0.8 10*3/uL (ref 0.1–1.0)
Monocytes Relative: 12 %
NEUTROS ABS: 5 10*3/uL (ref 1.7–7.7)
NEUTROS PCT: 72 %
Platelets: 247 10*3/uL (ref 150–400)
RBC: 3.07 MIL/uL — AB (ref 3.87–5.11)
RDW: 13.1 % (ref 11.5–15.5)
WBC: 7 10*3/uL (ref 4.0–10.5)

## 2017-11-07 MED ORDER — SENNOSIDES-DOCUSATE SODIUM 8.6-50 MG PO TABS: 2.0000 | ORAL_TABLET | Freq: Every day | ORAL | 0 refills | Status: AC

## 2017-11-07 NOTE — ED Notes (Signed)
Social Work at bedside 

## 2017-11-07 NOTE — ED Provider Notes (Signed)
[facility]-EMERGENCY DEPT Provider Note   CSN: [id number] Arrival date & time: 11/07/17  1800     History   Chief Complaint Chief Complaint  Patient presents with  . Weakness    HPI Carolyn Bishop is a 84 y.o. female.  HPI Patient presents 1 week after being discharged from this facility, now with concern for ongoing weakness, gait instability. Patient with multiple medical problems, notes that she was admitted here due to weakness, was found to have worsening renal function and on discharge was persistently weak, with difficulty using a walker. She notes that this has been about the same for the past week, and today after describing this to her primary care physician, and having difficulty making it to a follow-up appointment she was sent here for evaluation. She denies interval fever, vomiting, fall, confusion, disorientation, dyspnea, cough. She states that she takes all medication as directed.  Past Medical History:  Diagnosis Date  . Diabetes (HCC)   . HLD (hyperlipidemia)   . HTN (hypertension)   . Palpitations     Patient Active Problem List   Diagnosis Date Noted  . HLD (hyperlipidemia) 10/28/2017  . Acute urinary retention   . AKI (acute kidney injury) (HCC) 10/27/2017  . Hypotension 10/27/2017  . Diabetes mellitus without complication (HCC) 10/27/2017  . Essential hypertension 10/27/2017  . Hypoglycemia 10/27/2017  . Normocytic anemia 10/27/2017  . GIB (gastrointestinal bleeding) 10/27/2017  . Lung mass 10/27/2017  . Lactic acid acidosis 10/27/2017  . Left arm weakness 10/27/2017    Past Surgical History:  Procedure Laterality Date  . ABDOMINAL HYSTERECTOMY    . BIOPSY  10/29/2017   Procedure: BIOPSY;  Surgeon: Kerin SalenKarki, Arya, MD;  Location: [facility];  Service: Gastroenterology;;  . ESOPHAGOGASTRODUODENOSCOPY (EGD) WITH PROPOFOL N/A 10/29/2017   Procedure: ESOPHAGOGASTRODUODENOSCOPY (EGD) WITH PROPOFOL;  Surgeon: Kerin SalenKarki, Arya, MD;   Location: [facility];  Service: Gastroenterology;  Laterality: N/A;     OB History   None      Home Medications    Prior to Admission medications   Medication Sig Start Date End Date Taking? Authorizing Provider  acetaminophen (TYLENOL) 500 MG tablet Take 1,000 mg by mouth daily as needed (pain).   Yes [provider]  glipiZIDE (GLUCOTROL XL) 5 MG 24 hr tablet Take 1 tablet by mouth daily.   Yes [provider]  Multiple Vitamin (MULTIVITAMIN WITH MINERALS) TABS tablet Take 1 tablet by mouth daily. 10/31/17  Yes Penny PiaVega, [place], MD  pantoprazole (PROTONIX) 40 MG tablet Take 1 tablet (40 mg total) by mouth daily. 10/31/17 10/31/18 Yes Penny PiaVega, [place], MD    Family History Family History  Problem Relation Age of Onset  . Hypertension Mother   . Lung cancer Father     Social History Social History   Tobacco Use  . Smoking status: Never Smoker  . Smokeless tobacco: Never Used  Substance Use Topics  . Alcohol use: No    Alcohol/week: 0.0 oz  . Drug use: No     Allergies   Patient has no known allergies.   Review of Systems Review of Systems  Constitutional:       Per HPI, otherwise negative  HENT:       Per HPI, otherwise negative  Respiratory:       Per HPI, otherwise negative  Cardiovascular:       Per HPI, otherwise negative  Gastrointestinal: Positive for constipation. Negative for vomiting.  Endocrine:       Negative aside  from HPI  Genitourinary:       Neg aside from HPI   Musculoskeletal:       Per HPI, otherwise negative  Skin: Negative.   Neurological: Positive for weakness. Negative for syncope.     Physical Exam Updated Vital Signs BP 131/82   Pulse 73   Temp 98.1 F (36.7 C) (Oral)   Resp 17   Ht 5\' 3"  (1.6 m)   Wt 72.6 kg (160 lb)   SpO2 100%   BMI 28.34 kg/m   Physical Exam  Constitutional: She is oriented to person, place, and time. She has a sickly appearance. No distress.  HENT:  Head: Normocephalic and  atraumatic.  Eyes: Conjunctivae and EOM are normal.  Cardiovascular: Normal rate and regular rhythm.  Pulmonary/Chest: Effort normal and breath sounds normal. No stridor. No respiratory distress.  Abdominal: She exhibits no distension.  Musculoskeletal: She exhibits no edema.  Neurological: She is alert and oriented to person, place, and time. No cranial nerve deficit.  Skin: Skin is warm and dry.  Psychiatric: She has a normal mood and affect.  Nursing note and vitals reviewed.    ED Treatments / Results  Labs (all labs ordered are listed, but only abnormal results are displayed) Labs Reviewed  COMPREHENSIVE METABOLIC PANEL - Abnormal; Notable for the following components:      Result Value   Sodium 147 (*)    Chloride 113 (*)    BUN 34 (*)    Creatinine, Ser 1.24 (*)    Albumin 3.1 (*)    GFR calc non Af Amer 39 (*)    GFR calc Af Amer 45 (*)    All other components within normal limits  CBC WITH DIFFERENTIAL/PLATELET - Abnormal; Notable for the following components:   RBC 3.07 (*)    Hemoglobin 9.2 (*)    HCT 27.8 (*)    All other components within normal limits  URINALYSIS, ROUTINE W REFLEX MICROSCOPIC - Abnormal; Notable for the following components:   Leukocytes, UA LARGE (*)    Bacteria, UA MANY (*)    All other components within normal limits    Radiology Dg Chest 2 View  Result Date: 11/07/2017 CLINICAL DATA:  Weakness.  Unable to walk.  Diabetes. EXAM: CHEST - 2 VIEW COMPARISON:  10/27/2017. FINDINGS: Normal heart size. Low lung volumes. No consolidation or edema. No effusion or pneumothorax. Bones unremarkable. LEFT lower lobe lung nodule appears stable at 14 mm. IMPRESSION: No active cardiopulmonary disease. Electronically Signed   By: Elsie Stain M.D.   On: 11/07/2017 19:18    Procedures Procedures (including critical care time)  Medications Ordered in ED Medications - No data to display   Initial Impression / Assessment and Plan / ED Course  I have  reviewed the triage vital signs and the nursing notes.  Pertinent labs & imaging results that were available during my care of the patient were reviewed by me and considered in my medical decision making (see chart for details).    On repeat exam the patient is awake alert. Initial findings reassuring, with continued decline in creatinine, consistent with improvement following discharge.   11:14 PM I discussed patient's case with social work, given the patient's persistent weakness. We advocated for placement in a rehabilitation facility, which the patient declines. She has a strong preference for home health services. Patient's evaluation not complete, with no evidence for urinary tract infection, no substantial electrolyte abnormalities, and generally improved lab values compared to recent hospitalization.  The patient is awake and alert, hemodynamically unremarkable. Patient does have some constipation, but soft, non-peritoneal abdomen. After lengthy conversation with social work, and patient, patient had referral for initiation of home health services, and was discharged in stable condition.  Final Clinical Impressions(s) / ED Diagnoses   Final diagnoses:  Weakness    ED Discharge Orders        Ordered    Home Health     11/07/17 2244    Face-to-face encounter (required for Medicare/Medicaid patients)    Comments:  I Gerhard Munch certify that this patient is under my care and that I, or a nurse practitioner or physician's assistant working with me, had a face-to-face encounter that meets the physician face-to-face encounter requirements with this patient on 11/07/2017. The encounter with the patient was in whole, or in part for the following medical condition(s) which is the primary reason for home health care (List medical condition): additional orders per PMD.   11/07/17 2244    senna-docusate (SENOKOT-S) 8.6-50 MG tablet  Daily     11/07/17 2312       Gerhard Munch,  MD 11/07/17 2315

## 2017-11-07 NOTE — ED Notes (Signed)
ED Provider at bedside. 

## 2017-11-07 NOTE — ED Notes (Signed)
Spoke with non-emergency dispatcher for [facility] transportation back to her resident at [address], [place], [place] [id number].

## 2017-11-07 NOTE — Discharge Instructions (Signed)
As discussed, your evaluation today has been largely reassuring.  But, it is important that you monitor your condition carefully, and do not hesitate to return to the ED if you develop new, or concerning changes in your condition.  Otherwise, please follow-up with your physician for appropriate ongoing care.  Home health services should contact you tomorrow to arrange for appropriate evaluation and initiation of services.

## 2017-11-07 NOTE — ED Notes (Signed)
[facility] contacted 

## 2017-11-07 NOTE — ED Triage Notes (Signed)
Pt was d/c from hospital on 6/24. Since d/c pt unable to resume ADLs has been wheelchair bound. Pt has been constipated for a week and is incontinent. Pt family changes diaper at home and says urine has a foul odor as well as being a dark color. Pt reports no pain. BLE swollen. PCP was called and told pt to come to ED. VSS.

## 2017-11-07 NOTE — Progress Notes (Signed)
Consult request has been received. CSW attempting to follow up at present time.  CSW met with EDP who verified with chart pt was in the hospital and was D/C'd on 6/24.  Pt went home and since then has been unable to walk, move and has difficulty even using the restroom without assistance.  Pt's niece Melloani at ph: [phone] verified pt is unable to walk at all. Ptverified that she lived with her sister and that her niece checked on her every day and that the pt lived beside her other sister and her BIL, as well.  CSW spoke to pt who is A&OX4 and who states she wants to go home and attemt PT/OT until she is able to "get around" and then if that doesn't work states maybe she will try to go to a facility.  Pt states she has been in touch with her insurance [employer] and they stated to her she can get both if she wants.    CSW asked pt again if pt would like to seek assistance from home or receive information about going to an inpatient rehabilitation facility and pt stated she wanted to go home with PT to see if she can get or become stronger and walk again. Pt stated she desired to return home but would like to stay overnight to have a BM with the help of a laxative.  CSW spoke to pt's niece and advised pt's niece that it is unlikely that the pt will received authorized days from [facility] since pt's original stay was for "generalized weakness" and left-handed numbness and pt was ultimately D/C'd home. Pt returned to the ED after approx 7 day with no acute causes for increased generalized weakness and per EPD pt's labs are good.  CSW advised pt's niece and pt that an RN CM would call pt in the morning to set up [facility] services with PT/OT/RN/Aide/Social work and requested that the EPD place a consult for CM with an order for face-to-face and an order for  RN/Aide/PT/OT and Social Work.  CSW then asked the pt a third time what the pt wished to do and would the pt like information on placement to a SNF facility  and pt stated a third time no that the pt wished to return home with physical therapy to rest and regain her strength and that she would like a laxative to assist her with a BM before D/C'ing.  MD notified by the CSW.  Pt and pt's niece were appreciative and thanked the CSW.  EPD updated and RN updated.  RN called [facility] and niece went to the pt's home to await the pt and confirmed the niece would be there when the pt arrived.  Alphonse Guild. Darryl Willner, LCSW, LCAS, CSI Clinical Social Worker Ph: [phone]

## 2017-11-07 NOTE — ED Notes (Signed)
Bed: WA12 Expected date:  Expected time:  Means of arrival:  Comments: EMS-[facility]I- 

## 2017-11-08 ENCOUNTER — Telehealth: Payer: Self-pay | Admitting: *Deleted

## 2017-11-08 NOTE — Telephone Encounter (Signed)
Carolyn Locust J. Lucretia RoersWood, RN, BSN, [employer] [phone] Spoke with pt at bedside regarding discharge planning for [facility]. Offered pt list of home health agencies to choose from.  Pt chose [facility] to render services. Eugenio Hoesllen Williams [facility]. Patient [facility] aware that [facility] within [facility]hours.  No DME needs identified at this time.

## 2017-11-08 NOTE — Progress Notes (Signed)
Pt's niece Adron BeneMellani Martin at ph: [phone] stated she would like to involved with planning for [facility] CM to [facility].  Please reconsult if future social work needs arise.  CSW signing off, as social work intervention is no longer needed.  Dorothe PeaJonathan F. Lashunda Greis, LCSW, LCAS, CSI Clinical Social Worker Ph: [phone]

## 2019-10-11 IMAGING — DX DG CHEST 2V
2 series · 2 of 2 positions shown · non-contrast
Comparison: 07/27/2008, abdominal CT on 02/21/2015

CLINICAL DATA: Pt niece reports pt with generalized weakness x 1
week, mostly in the mornings. Niece states "she just hasn't been
herself this week" pt is awake and alert, oriented x 4, denies any
pain or other c/o at this time.

EXAM:
CHEST - 2 VIEW

[chest lat]
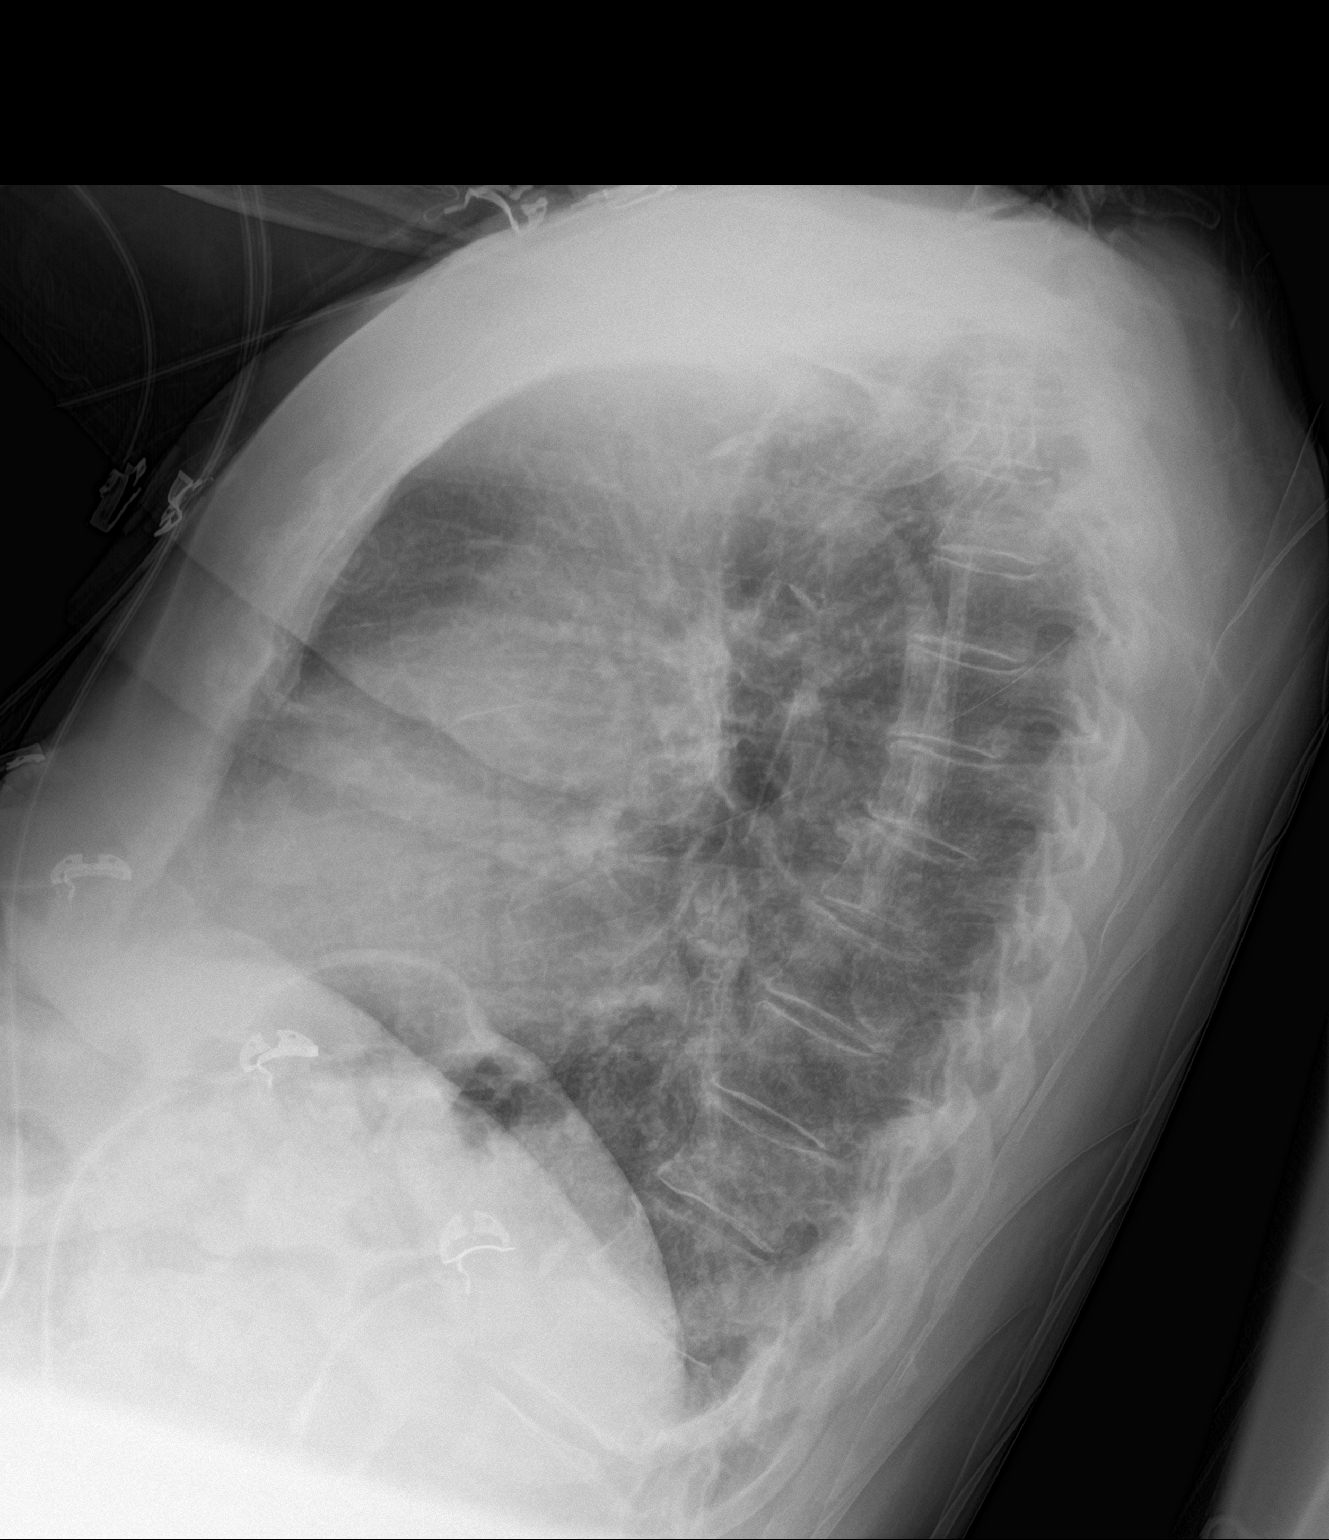

[chest ap]
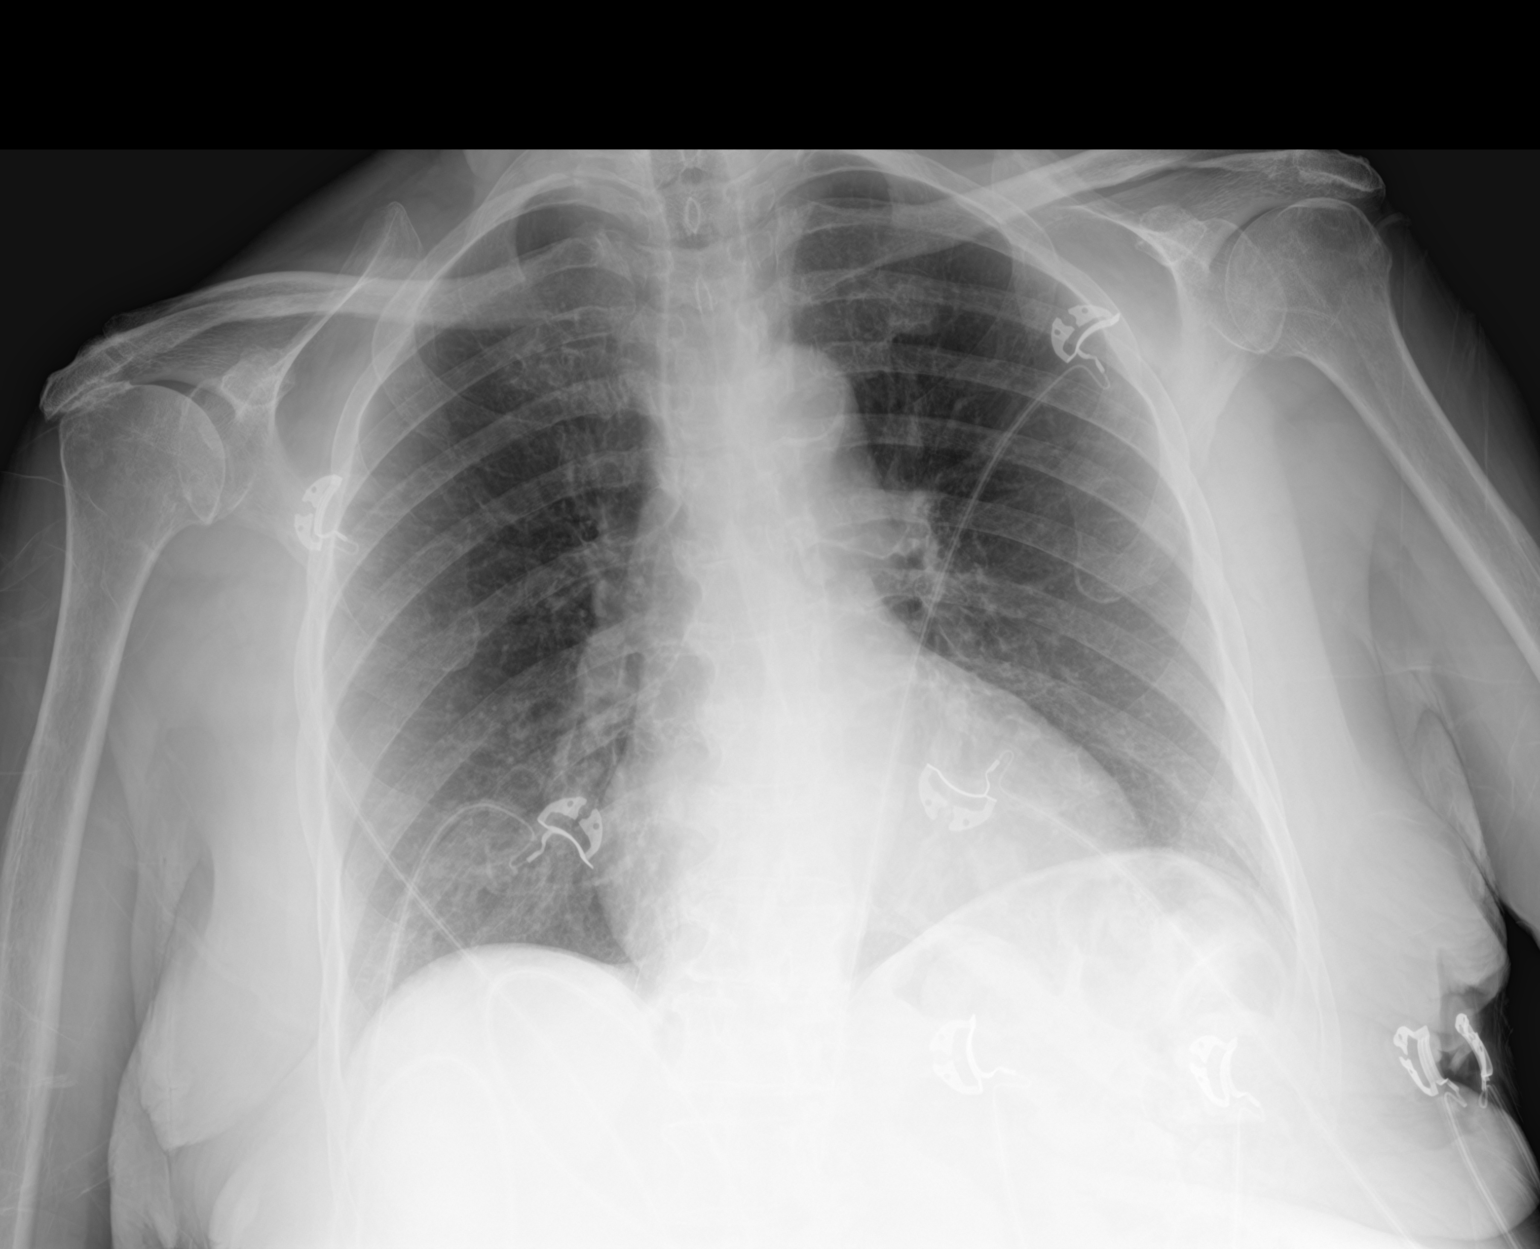

[2 of 2 positions shown; findings below may reference images not displayed]

FINDINGS: Heart size is normal. The lungs are free of focal consolidations. A
persistent mass is identified at the LEFT lung base, estimated to be
15 millimeters in diameter.
IMPRESSION: No pulmonary edema or consolidation.

Increased size of LEFT LOWER lobe nodule.

Further evaluation is recommended with CT of the chest.

## 2019-10-28 ENCOUNTER — Other Ambulatory Visit: Payer: Self-pay

## 2019-10-28 ENCOUNTER — Encounter (HOSPITAL_COMMUNITY): Payer: Self-pay | Admitting: Emergency Medicine

## 2019-10-28 ENCOUNTER — Inpatient Hospital Stay (HOSPITAL_COMMUNITY)
Admission: AD | Admit: 2019-10-28 | Discharge: 2019-11-02 | DRG: 689 | Disposition: A | Discharge status: Skilled Nursing Facility | Payer: Medicare HMO | Attending: Internal Medicine | Admitting: Internal Medicine

## 2019-10-28 ENCOUNTER — Emergency Department (HOSPITAL_COMMUNITY): Payer: Medicare HMO

## 2019-10-28 DIAGNOSIS — Z90710 Acquired absence of both cervix and uterus: Secondary | ICD-10-CM | POA: Diagnosis not present

## 2019-10-28 DIAGNOSIS — I519 Heart disease, unspecified: Secondary | ICD-10-CM

## 2019-10-28 DIAGNOSIS — I10 Essential (primary) hypertension: Secondary | ICD-10-CM | POA: Diagnosis present

## 2019-10-28 DIAGNOSIS — B9620 Unspecified Escherichia coli [E. coli] as the cause of diseases classified elsewhere: Secondary | ICD-10-CM | POA: Diagnosis present

## 2019-10-28 DIAGNOSIS — E785 Hyperlipidemia, unspecified: Secondary | ICD-10-CM | POA: Diagnosis present

## 2019-10-28 DIAGNOSIS — G9341 Metabolic encephalopathy: Secondary | ICD-10-CM | POA: Diagnosis present

## 2019-10-28 DIAGNOSIS — R7303 Prediabetes: Secondary | ICD-10-CM

## 2019-10-28 DIAGNOSIS — E119 Type 2 diabetes mellitus without complications: Secondary | ICD-10-CM | POA: Diagnosis present

## 2019-10-28 DIAGNOSIS — Z20822 Contact with and (suspected) exposure to covid-19: Secondary | ICD-10-CM | POA: Diagnosis present

## 2019-10-28 DIAGNOSIS — I5030 Unspecified diastolic (congestive) heart failure: Secondary | ICD-10-CM

## 2019-10-28 DIAGNOSIS — F0390 Unspecified dementia without behavioral disturbance: Secondary | ICD-10-CM | POA: Diagnosis present

## 2019-10-28 DIAGNOSIS — I5032 Chronic diastolic (congestive) heart failure: Secondary | ICD-10-CM | POA: Diagnosis present

## 2019-10-28 DIAGNOSIS — G9340 Encephalopathy, unspecified: Secondary | ICD-10-CM | POA: Diagnosis not present

## 2019-10-28 DIAGNOSIS — N3000 Acute cystitis without hematuria: Principal | ICD-10-CM | POA: Diagnosis present

## 2019-10-28 DIAGNOSIS — R5381 Other malaise: Secondary | ICD-10-CM | POA: Diagnosis present

## 2019-10-28 DIAGNOSIS — I110 Hypertensive heart disease with heart failure: Secondary | ICD-10-CM | POA: Diagnosis present

## 2019-10-28 DIAGNOSIS — R4182 Altered mental status, unspecified: Secondary | ICD-10-CM | POA: Diagnosis not present

## 2019-10-28 DIAGNOSIS — I5189 Other ill-defined heart diseases: Secondary | ICD-10-CM

## 2019-10-28 DIAGNOSIS — Z7984 Long term (current) use of oral hypoglycemic drugs: Secondary | ICD-10-CM

## 2019-10-28 DIAGNOSIS — N390 Urinary tract infection, site not specified: Secondary | ICD-10-CM

## 2019-10-28 LAB — CBC WITH DIFFERENTIAL/PLATELET
Abs Immature Granulocytes: 0.02 10*3/uL (ref 0.00–0.07)
Basophils Absolute: 0 10*3/uL (ref 0.0–0.1)
Basophils Relative: 1 %
Eosinophils Absolute: 0 10*3/uL (ref 0.0–0.5)
Eosinophils Relative: 1 %
HCT: 34.8 % — ABNORMAL LOW (ref 36.0–46.0)
Hemoglobin: 11.1 g/dL — ABNORMAL LOW (ref 12.0–15.0)
Immature Granulocytes: 0 %
Lymphocytes Relative: 16 %
Lymphs Abs: 0.8 10*3/uL (ref 0.7–4.0)
MCH: 30.1 pg (ref 26.0–34.0)
MCHC: 31.9 g/dL (ref 30.0–36.0)
MCV: 94.3 fL (ref 80.0–100.0)
Monocytes Absolute: 0.6 10*3/uL (ref 0.1–1.0)
Monocytes Relative: 12 %
Neutro Abs: 3.8 10*3/uL (ref 1.7–7.7)
Neutrophils Relative %: 70 %
Platelets: 197 10*3/uL (ref 150–400)
RBC: 3.69 MIL/uL — ABNORMAL LOW (ref 3.87–5.11)
RDW: 12.4 % (ref 11.5–15.5)
WBC: 5.3 10*3/uL (ref 4.0–10.5)
nRBC: 0 % (ref 0.0–0.2)

## 2019-10-28 LAB — BASIC METABOLIC PANEL
BUN: 31 — AB (ref 4–21)
CO2: 26 — AB (ref 13–22)
Chloride: 107 (ref 99–108)
Creatinine: 1 (ref 0.5–1.1)
Glucose: 148
Potassium: 4 (ref 3.4–5.3)
Sodium: 142 (ref 137–147)

## 2019-10-28 LAB — URINALYSIS, ROUTINE W REFLEX MICROSCOPIC
Bilirubin Urine: NEGATIVE
Glucose, UA: NEGATIVE mg/dL
Hgb urine dipstick: NEGATIVE
Ketones, ur: 5 mg/dL — AB
Nitrite: POSITIVE — AB
Protein, ur: 30 mg/dL — AB
Specific Gravity, Urine: 1.017 (ref 1.005–1.030)
pH: 5 (ref 5.0–8.0)

## 2019-10-28 LAB — COMPREHENSIVE METABOLIC PANEL
ALT: 21 U/L (ref 0–44)
AST: 27 U/L (ref 15–41)
Albumin: 3.6 g/dL (ref 3.5–5.0)
Alkaline Phosphatase: 65 U/L (ref 38–126)
Anion gap: 9 (ref 5–15)
BUN: 31 mg/dL — ABNORMAL HIGH (ref 8–23)
CO2: 26 mmol/L (ref 22–32)
Calcium: 10 mg/dL (ref 8.9–10.3)
Chloride: 107 mmol/L (ref 98–111)
Creatinine, Ser: 0.95 mg/dL (ref 0.44–1.00)
GFR calc Af Amer: >60 mL/min (ref >60)
GFR calc non Af Amer: 55 mL/min — ABNORMAL LOW (ref >60)
Glucose, Bld: 148 mg/dL — ABNORMAL HIGH (ref 70–99)
Potassium: 4 mmol/L (ref 3.5–5.1)
Sodium: 142 mmol/L (ref 135–145)
Total Bilirubin: 0.7 mg/dL (ref 0.3–1.2)
Total Protein: 7.1 g/dL (ref 6.5–8.1)

## 2019-10-28 LAB — SARS CORONAVIRUS 2 BY RT PCR (HOSPITAL ORDER, PERFORMED IN ~~LOC~~ HOSPITAL LAB): SARS Coronavirus 2: NEGATIVE

## 2019-10-28 LAB — LACTIC ACID, PLASMA: Lactic Acid, Venous: 1.8 mmol/L (ref 0.5–1.9)

## 2019-10-28 MED ORDER — SODIUM CHLORIDE 0.9 % IV BOLUS
500.0000 mL | Freq: Once | INTRAVENOUS | Status: AC
  Administered 2019-10-28: 500 mL via INTRAVENOUS

## 2019-10-28 MED ORDER — HYDRALAZINE HCL 20 MG/ML IJ SOLN
5.0000 mg | Freq: Four times a day (QID) | INTRAMUSCULAR | Status: DC | PRN
  Administered 2019-10-29: 5 mg via INTRAVENOUS
  Filled 2019-10-28: qty 1

## 2019-10-28 MED ORDER — PANTOPRAZOLE SODIUM 40 MG IV SOLR
40.0000 mg | INTRAVENOUS | Status: DC
  Administered 2019-10-29 – 2019-10-30 (×2): 40 mg via INTRAVENOUS
  Filled 2019-10-28 (×2): qty 40

## 2019-10-28 MED ORDER — SODIUM CHLORIDE 0.9 % IV SOLN
1.0000 g | Freq: Once | INTRAVENOUS | Status: AC
  Administered 2019-10-28: 1 g via INTRAVENOUS
  Filled 2019-10-28: qty 10

## 2019-10-28 NOTE — ED Notes (Signed)
Pt transported to CT ?

## 2019-10-28 NOTE — ED Provider Notes (Signed)
[facility] EMERGENCY DEPARTMENT Provider Note   CSN: [id number] Arrival date & time: 10/28/19  1926     History Chief Complaint  Patient presents with  . Confused/Malodorous Urine    Carolyn Bishop is a 86 y.o. female.  Level 5 caveat secondary to altered mental status.  Patient brought in from home by EMS for confusion that reportedly started this morning along with some foul-smelling urine.  Patient was noted to be alert but disoriented to time.  Patient herself denies any complaints.  Also states she does not know her name or where she is.  History of diabetes hypertension palpitations.  The history is provided by the patient, the EMS personnel and a relative. The history is limited by the condition of the patient.  Altered Mental Status Presenting symptoms: confusion   Severity:  Moderate Most recent episode:  Today Episode history:  Single Timing:  Constant Progression:  Unchanged Chronicity:  New Associated symptoms: no abdominal pain, no difficulty breathing, no headaches and no vomiting        Past Medical History:  Diagnosis Date  . Diabetes ([place])   . HLD (hyperlipidemia)   . HTN (hypertension)   . Palpitations     Patient Active Problem List   Diagnosis Date Noted  . HLD (hyperlipidemia) 10/28/2017  . Acute urinary retention   . AKI (acute kidney injury) ([place]) 10/27/2017  . Hypotension 10/27/2017  . Diabetes mellitus without complication ([place]) 61/44/3154  . Essential hypertension 10/27/2017  . Hypoglycemia 10/27/2017  . Normocytic anemia 10/27/2017  . GIB (gastrointestinal bleeding) 10/27/2017  . Lung mass 10/27/2017  . Lactic acid acidosis 10/27/2017  . Left arm weakness 10/27/2017    Past Surgical History:  Procedure Laterality Date  . ABDOMINAL HYSTERECTOMY    . BIOPSY  10/29/2017   Procedure: BIOPSY;  Surgeon: Ronnette Juniper, MD;  [facility]: [facility];  Service: Gastroenterology;;  . ESOPHAGOGASTRODUODENOSCOPY (EGD) WITH PROPOFOL  N/A 10/29/2017   Procedure: ESOPHAGOGASTRODUODENOSCOPY (EGD) WITH PROPOFOL;  Surgeon: Ronnette Juniper, MD;  [facility]: [facility];  Service: Gastroenterology;  Laterality: N/A;     OB History   No obstetric history on file.     Family History  Problem Relation Age of Onset  . Hypertension Mother   . Lung cancer Father     Social History   Tobacco Use  . Smoking status: Never Smoker  . Smokeless tobacco: Never Used  Substance Use Topics  . Alcohol use: No    Alcohol/week: 0.0 standard drinks  . Drug use: No    Home Medications Prior to Admission medications   Medication Sig Start Date End Date Taking? Authorizing Provider  acetaminophen (TYLENOL) 500 MG tablet Take 1,000 mg by mouth daily as needed (pain).    [provider]  glipiZIDE (GLUCOTROL XL) 5 MG 24 hr tablet Take 1 tablet by mouth daily.    [provider]  Multiple Vitamin (MULTIVITAMIN WITH MINERALS) TABS tablet Take 1 tablet by mouth daily. 10/31/17   Velvet Bathe, MD  pantoprazole (PROTONIX) 40 MG tablet Take 1 tablet (40 mg total) by mouth daily. 10/31/17 10/31/18  Velvet Bathe, MD    Allergies    Patient has no known allergies.  Review of Systems   Review of Systems  Unable to perform ROS: Dementia  Gastrointestinal: Negative for abdominal pain and vomiting.  Neurological: Negative for headaches.  Psychiatric/Behavioral: Positive for confusion.    Physical Exam Updated Vital Signs BP 114/78 (BP Location: Left Arm)   Pulse 86  Temp 98.6 F (37 C) (Oral)   Resp 20   Ht 5\' 4"  (1.626 m)   Wt 70 kg   SpO2 100%   BMI 26.49 kg/m   Physical Exam Vitals and nursing note reviewed.  Constitutional:      General: She is not in acute distress.    Appearance: Normal appearance. She is well-developed.  HENT:     Head: Normocephalic and atraumatic.  Eyes:     Conjunctiva/sclera: Conjunctivae normal.  Cardiovascular:     Rate and Rhythm: Normal rate and regular rhythm.     Heart  sounds: No murmur heard.   Pulmonary:     Effort: Pulmonary effort is normal. No respiratory distress.     Breath sounds: Normal breath sounds.  Abdominal:     Palpations: Abdomen is soft.     Tenderness: There is no abdominal tenderness. There is no guarding or rebound.  Musculoskeletal:        General: No tenderness. Normal range of motion.     Cervical back: Neck supple.     Right lower leg: Edema present.     Left lower leg: Edema present.  Skin:    General: Skin is warm and dry.  Neurological:     General: No focal deficit present.     Mental Status: She is alert. She is disoriented.     Sensory: No sensory deficit.     Motor: No weakness.     Comments: A&O x0     ED Results / Procedures / Treatments   Labs (all labs ordered are listed, but only abnormal results are displayed) Labs Reviewed  URINALYSIS, ROUTINE W REFLEX MICROSCOPIC - Abnormal; Notable for the following components:      Result Value   Ketones, ur 5 (*)    Protein, ur 30 (*)    Nitrite POSITIVE (*)    Leukocytes,Ua SMALL (*)    Bacteria, UA FEW (*)    All other components within normal limits  CBC WITH DIFFERENTIAL/PLATELET - Abnormal; Notable for the following components:   RBC 3.69 (*)    Hemoglobin 11.1 (*)    HCT 34.8 (*)    All other components within normal limits  COMPREHENSIVE METABOLIC PANEL - Abnormal; Notable for the following components:   Glucose, Bld 148 (*)    BUN 31 (*)    GFR calc non Af Amer 55 (*)    All other components within normal limits  CBC - Abnormal; Notable for the following components:   RBC 3.85 (*)    Hemoglobin 11.5 (*)    All other components within normal limits  CBG MONITORING, ED - Abnormal; Notable for the following components:   Glucose-Capillary 66 (*)    All other components within normal limits  CBG MONITORING, ED - Abnormal; Notable for the following components:   Glucose-Capillary 60 (*)    All other components within normal limits  CBG MONITORING,  ED - Abnormal; Notable for the following components:   Glucose-Capillary 127 (*)    All other components within normal limits  CULTURE, BLOOD (ROUTINE X 2)  CULTURE, BLOOD (ROUTINE X 2)  SARS CORONAVIRUS 2 BY RT PCR (HOSPITAL ORDER, PERFORMED IN [facility])  URINE CULTURE  LACTIC ACID, PLASMA  MAGNESIUM  PHOSPHORUS  CBG MONITORING, ED    EKG None  Radiology CT Head Wo Contrast  Result Date: 10/28/2019 CLINICAL DATA:  Altered mental status. EXAM: CT HEAD WITHOUT CONTRAST TECHNIQUE: Contiguous axial images were obtained from  the base of the skull through the vertex without intravenous contrast. COMPARISON:  July 10, 2004 FINDINGS: Brain: There is moderate severity cerebral atrophy with widening of the extra-axial spaces and ventricular dilatation. There are areas of decreased attenuation within the white matter tracts of the supratentorial brain, consistent with microvascular disease changes. Vascular: No hyperdense vessel or unexpected calcification. Skull: Normal. Negative for fracture or focal lesion. Sinuses/Orbits: There is marked severity sphenoid sinus mucosal thickening. Other: None. IMPRESSION: 1. Generalized cerebral atrophy. 2. No acute intracranial abnormality. Electronically Signed   By: Aram Candela M.D.   On: 10/28/2019 22:31   DG Chest Port 1 View  Result Date: 10/28/2019 CLINICAL DATA:  Altered mental status. EXAM: PORTABLE CHEST 1 VIEW COMPARISON:  11/07/2017, lung bases from abdominal CT 02/21/2015. FINDINGS: Patient is leaning to the left. 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 This is stable in size from 2016. The cardiomediastinal contours are normal. Aortic atherosclerosis. Pulmonary vasculature is normal. No consolidation, pleural effusion, or pneumothorax. No acute osseous abnormalities are seen. IMPRESSION: No acute abnormality. Electronically Signed   By: Narda Rutherford M.D.   On: 10/28/2019 22:16     Procedures Procedures (including critical care time)  Medications Ordered in ED Medications  sodium chloride 0.9 % bolus 500 mL (has no administration in time range)    ED Course  I have reviewed the triage vital signs and the nursing notes.  Pertinent labs & imaging results that were available during my care of the patient were reviewed by me and considered in my medical decision making (see chart for details).  Clinical Course as of Oct 29 1131  Sun Oct 28, 2019  2308 Attempted to call family contact number was not in service.   [MB]  2317 Discussed with Dr. Morene Antu [facility] hospitalist who will evaluate the patient for admission.   [MB]    Clinical Course User Index [MB] Terrilee Files, MD   MDM Rules/Calculators/A&P                         This patient complains of acute confusion; this involves an extensive number of treatment Options and is a complaint that carries with it a high risk of complications and Morbidity. The differential includes sepsis, Sirs, infection, encephalopathy, stroke, bleed, metabolic derangement  I ordered, reviewed and interpreted labs, which included CBC with a normal white count stable low hemoglobin , Chemistries fairly normal other than elevated glucose and BUN urinalysis nitrite +11-20 whites possible infection lactic acid not elevated I ordered medication IV fluids and IV ceftriaxone I ordered imaging studies which included chest x-ray and head CT and I independently    visualized and interpreted imaging which showed no acute findings Previous records obtained and reviewed in epic, last ED visit over 2 years ago for weakness gait problem I consulted [facility] hospitalist Dr. Morene Antu and discussed lab and imaging findings  Critical Interventions: None  After the interventions stated above, I reevaluated the patient and found patient to be hemodynamically stable.  Will need admission for further work-up of her acute confusion possibly related to  urinary tract infection.  I was unable to reach family for further information.   Final Clinical Impression(s) / ED Diagnoses Final diagnoses:  Encephalopathy acute  Lower urinary tract infection, acute    Rx / DC Orders ED Discharge Orders    None       Terrilee Files, MD  10/29/19 1139  

## 2019-10-28 NOTE — ED Triage Notes (Signed)
Patient arrived with EMS from home family reported intermittent confusion this morning with malodorous urine , CBG= 146 by EMS , alert at arrival , disoriented to time at arrival . Respirations unlabored/ afebrile .

## 2019-10-28 NOTE — H&P (Signed)
[facility] History and Physical  Jasmarie Coppock WGN:[id number] DOB: 1932-07-06 DOA: 10/28/2019  Referring EDP: Charm Barges PCP: Andi Devon, MD   Chief Complaint: AMS  HPI: Carolyn Bishop is a 86 y.o. female with PMH of T2DM and HTN who was brought to ED by EMS for confusion and admitted for acute encephalopathy presumed secondary to UTI.  History obtained mainly from chart as patient unable to provide history and both patient contacts are unreachable at this time. Patient was reportedly brought in by EMS from home for confusion that started this morning. Family dropped her off and said to call with questions. Reported "foul-smelling urine." Patient unable to answer any questions beyond responding to name and simple commands.   In the ED: Labile pressures otherwise stable on room air; afebrile. Labs remarkable for WBC 5.3, Hgb 11.1, glucose 148 otherwise CMP WNL, Lactate WNL, UA with leuks/nitrites. Blood cx drawn. CT Head non-acute. Patient given 500 mL IVF and Rocephin and called for admission for AMS presumed secondary to UTI.  Review of Systems:  All other systems negative unless noted above in HPI.   Past Medical History:  Diagnosis Date  . Diabetes (HCC)   . HLD (hyperlipidemia)   . HTN (hypertension)   . Palpitations    Past Surgical History:  Procedure Laterality Date  . ABDOMINAL HYSTERECTOMY    . BIOPSY  10/29/2017   Procedure: BIOPSY;  Surgeon: Kerin Salen, MD;  Location: [facility];  Service: Gastroenterology;;  . ESOPHAGOGASTRODUODENOSCOPY (EGD) WITH PROPOFOL N/A 10/29/2017   Procedure: ESOPHAGOGASTRODUODENOSCOPY (EGD) WITH PROPOFOL;  Surgeon: Kerin Salen, MD;  Location: [facility];  Service: Gastroenterology;  Laterality: N/A;   Social History:  reports that she has never smoked. She has never used smokeless tobacco. She reports that she does not drink alcohol and does not use drugs.  No Known Allergies  Family History  Problem Relation Age of Onset  .  Hypertension Mother   . Lung cancer Father      Prior to Admission medications   Medication Sig Start Date End Date Taking? Authorizing Provider  acetaminophen (TYLENOL) 500 MG tablet Take 1,000 mg by mouth daily as needed (pain).    [provider]  glipiZIDE (GLUCOTROL XL) 5 MG 24 hr tablet Take 1 tablet by mouth daily.    [provider]  Multiple Vitamin (MULTIVITAMIN WITH MINERALS) TABS tablet Take 1 tablet by mouth daily. 10/31/17   Penny Pia, MD  pantoprazole (PROTONIX) 40 MG tablet Take 1 tablet (40 mg total) by mouth daily. 10/31/17 10/31/18  Penny Pia, MD   Physical Exam: Vitals:   10/28/19 2138 10/28/19 2200 10/28/19 2232 10/28/19 2315  BP: 114/78 (!) 109/91 (!) 153/53 (!) 161/56  Pulse: 86 80 68 (!) 54  Resp: 20 (!) 21 18 16   Temp:      TempSrc:      SpO2: 100% 99% 95% 100%  Weight:      Height:        Wt Readings from Last 3 Encounters:  10/28/19 70 kg  11/07/17 72.6 kg  10/27/17 54.4 kg    . General:  Resting in bed. Will barely open eyes to loud voice. Follows some commands. Oriented to person and sometimes to place.  . Eyes: EOMI, eyes closed most of exam . ENT: grossly normal hearing, lips & tongue . Neck: normal ROM . Cardiovascular: RRR, no m/r/g. 2+ BLE pitting edema. 10/29/17 Respiratory: CTA bilaterally, no w/r/r. Normal respiratory effort. . Abdomen: soft, ntnd . Skin: no rash  or induration seen on limited exam . Musculoskeletal: grossly normal tone BUE/BLE . Psychiatric: minimal speech to observe but fluent . Neurologic: grossly non-focal; follows commands          Labs on Admission:  Basic Metabolic Panel: Recent Labs  Lab 10/28/19 1941  NA 142  K 4.0  CL 107  CO2 26  GLUCOSE 148*  BUN 31*  CREATININE 0.95  CALCIUM 10.0   Liver Function Tests: Recent Labs  Lab 10/28/19 1941  AST 27  ALT 21  ALKPHOS 65  BILITOT 0.7  PROT 7.1  ALBUMIN 3.6   No results for input(s): LIPASE, AMYLASE in the last 168 hours. No  results for input(s): AMMONIA in the last 168 hours. CBC: Recent Labs  Lab 10/28/19 1941  WBC 5.3  NEUTROABS 3.8  HGB 11.1*  HCT 34.8*  MCV 94.3  PLT 197   Cardiac Enzymes: No results for input(s): CKTOTAL, CKMB, CKMBINDEX, TROPONINI in the last 168 hours.  BNP (last 3 results) No results for input(s): BNP in the last 8760 hours.  ProBNP (last 3 results) No results for input(s): PROBNP in the last 8760 hours.  CBG: No results for input(s): GLUCAP in the last 168 hours.  Radiological Exams on Admission: CT Head Wo Contrast  Result Date: 10/28/2019 CLINICAL DATA:  Altered mental status. EXAM: CT HEAD WITHOUT CONTRAST TECHNIQUE: Contiguous axial images were obtained from the base of the skull through the vertex without intravenous contrast. COMPARISON:  July 10, 2004 FINDINGS: Brain: There is moderate severity cerebral atrophy with widening of the extra-axial spaces and ventricular dilatation. There are areas of decreased attenuation within the white matter tracts of the supratentorial brain, consistent with microvascular disease changes. Vascular: No hyperdense vessel or unexpected calcification. Skull: Normal. Negative for fracture or focal lesion. Sinuses/Orbits: There is marked severity sphenoid sinus mucosal thickening. Other: None. IMPRESSION: 1. Generalized cerebral atrophy. 2. No acute intracranial abnormality. Electronically Signed   By: Aram Candela M.D.   On: 10/28/2019 22:31   DG Chest Port 1 View  Result Date: 10/28/2019 CLINICAL DATA:  Altered mental status. EXAM: PORTABLE CHEST 1 VIEW COMPARISON:  11/07/2017, lung bases from abdominal CT 02/21/2015. FINDINGS: Patient is leaning to the left. 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 This is stable in size from 2016. The cardiomediastinal contours are normal. Aortic atherosclerosis. Pulmonary vasculature is normal. No consolidation, pleural effusion, or pneumothorax. No acute  osseous abnormalities are seen. IMPRESSION: No acute abnormality. Electronically Signed   By: Narda Rutherford M.D.   On: 10/28/2019 22:16    EKG: None  Assessment/Plan Principal Problem:   Acute encephalopathy Active Problems:   Diabetes mellitus without complication (HCC)   Essential hypertension   Acute cystitis   Grade I diastolic dysfunction  86 y.o. female with PMH of T2DM and HTN who was brought to ED by EMS for confusion and admitted for acute encephalopathy presumed secondary to UTI.  Acute Encephalopathy Likely source: Acute Cystitis - unable to fully evaluate as family not able to provide hx on baseline - appears to have been with rather abrupt onset of confusion this morning  - last seen in ED in July 2019 with weakness/gait instability - reportedly lives with family at home now - UA with nitrites/leuks and few bacteria and report of foul-smelling urine - cont Rocephin x 2 more doses - F/u Ur cx and blood cx; WBC non-elevated, lactate WNL, afebrile; exam non-focal - CT Head non-acute -  due to AMS, will obtain swallow study prior to ordering diet - PT/OT - Hold more IVF at this time due to LE edema  CHF - Grade I diastolic dysfunction on last Echo in June 2019 - unsure if patient has baseline edema - could consider dose of IV lasix but will hold off for now  HTN - no home anti-hypertensives on med list  - mostly elevated in ED with one of 109/91 - Hydralazine 5 mg for SBP>160 ordered  - consider Lasix as above  T2DM - will hold home glipizide  - glucose 148 on admission - monitor closely for hypoglycemia; cbg's q4h  Code Status: Full; presumed - patient unable to verify and unable to reach family DVT Prophylaxis: Lovenox GI PPx: IV PPI q24h Family Communication: Attempts by EDP and myself unsuccessful. One sister's phone number is out of service and the other never answered. Disposition Plan: Admit to inpatient as patient unlikely to resolve mental status  in next 24 hours and will likely need 2 midnight stay. Currently she is barely able to follow commands and only oriented to person and sometimes to place. PT/OT consulted. Anticipate discharge home in 2-3 days.    Time spent: 50 minutes  Chauncey Mann, MD [facility] Pager [phone]

## 2019-10-29 DIAGNOSIS — I519 Heart disease, unspecified: Secondary | ICD-10-CM

## 2019-10-29 DIAGNOSIS — I10 Essential (primary) hypertension: Secondary | ICD-10-CM

## 2019-10-29 LAB — CBG MONITORING, ED
Glucose-Capillary: 74 mg/dL (ref 70–99)
Glucose-Capillary: 66 mg/dL — ABNORMAL LOW (ref 70–99)
Glucose-Capillary: 60 mg/dL — ABNORMAL LOW (ref 70–99)
Glucose-Capillary: 127 mg/dL — ABNORMAL HIGH (ref 70–99)
Glucose-Capillary: 99 mg/dL (ref 70–99)
Glucose-Capillary: 133 mg/dL — ABNORMAL HIGH (ref 70–99)

## 2019-10-29 LAB — PHOSPHORUS: Phosphorus: 3.5 mg/dL (ref 2.5–4.6)

## 2019-10-29 LAB — CBC
HCT: 36.4 % (ref 36.0–46.0)
Hemoglobin: 11.5 g/dL — ABNORMAL LOW (ref 12.0–15.0)
MCH: 29.9 pg (ref 26.0–34.0)
MCHC: 31.6 g/dL (ref 30.0–36.0)
MCV: 94.5 fL (ref 80.0–100.0)
Platelets: 185 10*3/uL (ref 150–400)
RBC: 3.85 MIL/uL — ABNORMAL LOW (ref 3.87–5.11)
RDW: 12.4 % (ref 11.5–15.5)
WBC: 4.3 10*3/uL (ref 4.0–10.5)
nRBC: 0 % (ref 0.0–0.2)

## 2019-10-29 LAB — GLUCOSE, CAPILLARY: Glucose-Capillary: 271 mg/dL — ABNORMAL HIGH (ref 70–99)

## 2019-10-29 LAB — MAGNESIUM: Magnesium: 1.8 mg/dL (ref 1.7–2.4)

## 2019-10-29 MED ORDER — DEXTROSE 50 % IV SOLN
INTRAVENOUS | Status: AC
  Administered 2019-10-29: 25 mL via INTRAVENOUS
  Filled 2019-10-29: qty 50

## 2019-10-29 MED ORDER — DEXTROSE 50 % IV SOLN: 25.0000 mL | Freq: Once | INTRAVENOUS | Status: AC

## 2019-10-29 MED ORDER — ENOXAPARIN SODIUM 40 MG/0.4ML ~~LOC~~ SOLN
40.0000 mg | Freq: Every day | SUBCUTANEOUS | Status: DC
  Administered 2019-10-29 – 2019-11-01 (×4): 40 mg via SUBCUTANEOUS
  Filled 2019-10-29 (×4): qty 0.4

## 2019-10-29 MED ORDER — INSULIN ASPART 100 UNIT/ML ~~LOC~~ SOLN: 0.0000 [IU] | SUBCUTANEOUS | Status: DC

## 2019-10-29 MED ORDER — INSULIN ASPART 100 UNIT/ML ~~LOC~~ SOLN
0.0000 [IU] | Freq: Three times a day (TID) | SUBCUTANEOUS | Status: DC
  Administered 2019-10-29: 5 [IU] via SUBCUTANEOUS

## 2019-10-29 MED ORDER — SODIUM CHLORIDE 0.9 % IV SOLN
1.0000 g | INTRAVENOUS | Status: AC
  Administered 2019-10-29 – 2019-10-30 (×2): 1 g via INTRAVENOUS
  Filled 2019-10-29 (×2): qty 1

## 2019-10-29 NOTE — ED Notes (Signed)
CBG 60, RN paige notified.

## 2019-10-29 NOTE — ED Notes (Signed)
Pt is on the phone with her niece Shawna Orleans at this time.

## 2019-10-29 NOTE — ED Notes (Signed)
Lunch Tray Ordered @ 1050. 

## 2019-10-29 NOTE — ED Notes (Signed)
Ordered hospital bed per RN Bailey--Burle Kwan 

## 2019-10-29 NOTE — Progress Notes (Addendum)
PROGRESS NOTE  Carolyn Bishop QMV:[id number] DOB: 27-Sep-1932 DOA: 10/28/2019 PCP: Andi Devon, MD   LOS: 1 day   Brief narrative: As per HPI,  Carolyn Bishop is a 86 y.o. female with past medical history of of DM type II and HTN was brought to ED by EMS for confusion and admitted to the hospital for acute encephalopathy presumed secondary to UTI.Admitting physician was unable to reach the family. Patient was reportedly brought in by EMS from home for confusion that started this morning. Family dropped her off and said to call with questions. In the ED: Labile pressures otherwise stable on room air; afebrile. Labs remarkable for WBC 5.3, Hgb 11.1, glucose 148 otherwise CMP WNL, Lactate normal limits, UA with leuks/nitrites. Blood cultures were drawn. CT Head was non-acute. Patient given 500 mL IVF and Rocephin and called for admission for altered mental status presumed secondary to UTI.  Assessment/Plan:  Principal Problem:   Acute encephalopathy Active Problems:   Diabetes mellitus without complication (HCC)   Essential hypertension   Acute cystitis   Grade I diastolic dysfunction  Acute metabolic encephalopathy secondary to Acute Cystitis -Patient is fully alert awake oriented at this time.  She complains of generalized weakness in gait instability and lives with her sisters at home.  Urine analysis was abnormal.  Being treated with IV Rocephin for possible UTI.  Patient is likely at her baseline at this time.    Chronic diastolic congestive heart failure.   2D echocardiogram reviewed from June 2019 showed grade I diastolic dysfunction.  Compensated at this time.  Essential HTN No medications in the med rec.  Will monitor.  T2DM Mention of glipizide as per the admitting provider but as per med rec no medications.  Continue sliding scale insulin Accu-Cheks diabetic diet.  DVT prophylaxis: enoxaparin (LOVENOX) injection 40 mg Start: 10/29/19 1000    Code Status: Full  code  Family Communication: Unable to reach the patient's sisters on the phone despite multiple attempts.  Status is: Inpatient  Remains inpatient appropriate because:Unsafe d/c plan, Inpatient level of care appropriate due to severity of illness and Physical therapy evaluation.   Dispo: The patient is from: Home              Anticipated d/c is to: Undetermined at this time              Anticipated d/c date is: 2 days              Patient currently is not medically stable to d/c.   Consultants:  None  Procedures:  None  Antibiotics:  . Rocephin IV> 10/28/2019  Anti-infectives (From admission, onward)   Start     Dose/Rate Route Frequency Ordered Stop   10/29/19 2100  cefTRIAXone (ROCEPHIN) 1 g in sodium chloride 0.9 % 100 mL IVPB     Discontinue     1 g 200 mL/hr over 30 Minutes Intravenous Every 24 hours 10/29/19 0204 10/31/19 2059   10/28/19 2245  cefTRIAXone (ROCEPHIN) 1 g in sodium chloride 0.9 % 100 mL IVPB        1 g 200 mL/hr over 30 Minutes Intravenous  Once 10/28/19 2232 10/29/19 0014      Subjective: Today, patient was seen and examined at bedside.  Patient denies any dizziness, lightheadedness, fever, chills or rigor.  She is alert awake and communicative.  Denies urinary urgency, frequency or dysuria.  Feels generalized weakness.  Objective: Vitals:   10/29/19 1158 10/29/19 1300  BP: (!) 184/68 Marland Kitchen)  140/99  Pulse: 71 73  Resp: 15   Temp:    SpO2: 100% 100%   No intake or output data in the 24 hours ending 10/29/19 1458 Filed Weights   10/28/19 1933  Weight: 70 kg   Body mass index is 26.49 kg/m.   Physical Exam: GENERAL: Patient is alert awake and oriented. Not in obvious distress. HENT: No scleral pallor or icterus. Pupils equally reactive to light. Oral mucosa is moist NECK: is supple, no gross swelling noted. CHEST: Clear to auscultation. No crackles or wheezes.  Diminished breath sounds bilaterally. CVS: S1 and S2 heard, no murmur. Regular  rate and rhythm.  ABDOMEN: Soft, non-tender, bowel sounds are present. EXTREMITIES: No edema. CNS: Cranial nerves are intact. No focal motor deficits. SKIN: warm and dry without rashes.  Data Review: I have personally reviewed the following laboratory data and studies,  CBC: Recent Labs  Lab 10/28/19 1941 10/29/19 0447  WBC 5.3 4.3  NEUTROABS 3.8  --   HGB 11.1* 11.5*  HCT 34.8* 36.4  MCV 94.3 94.5  PLT 197 409   Basic Metabolic Panel: Recent Labs  Lab 10/28/19 1941 10/29/19 0447  NA 142  --   K 4.0  --   CL 107  --   CO2 26  --   GLUCOSE 148*  --   BUN 31*  --   CREATININE 0.95  --   CALCIUM 10.0  --   MG  --  1.8  PHOS  --  3.5   Liver Function Tests: Recent Labs  Lab 10/28/19 1941  AST 27  ALT 21  ALKPHOS 65  BILITOT 0.7  PROT 7.1  ALBUMIN 3.6   No results for input(s): LIPASE, AMYLASE in the last 168 hours. No results for input(s): AMMONIA in the last 168 hours. Cardiac Enzymes: No results for input(s): CKTOTAL, CKMB, CKMBINDEX, TROPONINI in the last 168 hours. BNP (last 3 results) No results for input(s): BNP in the last 8760 hours.  ProBNP (last 3 results) No results for input(s): PROBNP in the last 8760 hours.  CBG: Recent Labs  Lab 10/29/19 0446 10/29/19 0756 10/29/19 0941 10/29/19 1030 10/29/19 1155  GLUCAP 74 66* 60* 127* 99   Recent Results (from the past 240 hour(s))  Culture, blood (routine x 2)     Status: None (Preliminary result)   Collection Time: 10/28/19  9:58 PM   Specimen: BLOOD  Result Value Ref Range Status   Specimen Description BLOOD RIGHT HAND  Final   Special Requests   Final    BOTTLES DRAWN AEROBIC AND ANAEROBIC Blood Culture results may not be optimal due to an inadequate volume of blood received in culture bottles   Culture   Final    NO GROWTH < 12 HOURS Performed at [facility], [place] [address] [place], [place] [address]    Report Status PENDING  Incomplete  SARS Coronavirus 2 by RT PCR (hospital  order, performed in [facility] hospital lab) Nasopharyngeal Nasopharyngeal Swab     Status: None   Collection Time: 10/28/19 10:33 PM   Specimen: Nasopharyngeal Swab  Result Value Ref Range Status   SARS Coronavirus 2 NEGATIVE NEGATIVE Final    Comment: (NOTE) SARS-CoV-2 target nucleic acids are NOT DETECTED.  The SARS-CoV-2 RNA is generally detectable in upper and lower respiratory specimens during the acute phase of infection. The lowest concentration of SARS-CoV-2 viral copies this assay can detect is 250 copies / mL. A negative result does not preclude  SARS-CoV-2 infection and should not be used as the sole basis for treatment or other patient management decisions.  A negative result may occur with improper specimen collection / handling, submission of specimen other than nasopharyngeal swab, presence of viral mutation(s) within the areas targeted by this assay, and inadequate number of viral copies (<250 copies / mL). A negative result must be combined with clinical observations, patient history, and epidemiological information.  Fact Sheet for Patients:   BoilerBrush.com.cy  Fact Sheet for Healthcare Providers: https://pope.com/  This test is not yet approved or  cleared by the [place] FDA and has been authorized for detection and/or diagnosis of SARS-CoV-2 by FDA under an Emergency Use Authorization (EUA).  This EUA will remain in effect (meaning this test can be used) for the duration of the COVID-19 declaration under Section 564(b)(1) of the Act, 21 U.S.C. section 360bbb-3(b)(1), unless the authorization is terminated or revoked sooner.  Performed at [facility], [address], [place], [place] [address]   Culture, blood (routine x 2)     Status: None (Preliminary result)   Collection Time: 10/28/19 10:51 PM   Specimen: BLOOD  Result Value Ref Range Status   Specimen Description BLOOD LEFT FOREARM  Final    Special Requests   Final    BOTTLES DRAWN AEROBIC AND ANAEROBIC Blood Culture adequate volume   Culture   Final    NO GROWTH < 12 HOURS Performed at [facility], [address], [place], [place] [address]    Report Status PENDING  Incomplete     Studies: CT Head Wo Contrast  Result Date: 10/28/2019 CLINICAL DATA:  Altered mental status. EXAM: CT HEAD WITHOUT CONTRAST TECHNIQUE: Contiguous axial images were obtained from the base of the skull through the vertex without intravenous contrast. COMPARISON:  July 10, 2004 FINDINGS: Brain: There is moderate severity cerebral atrophy with widening of the extra-axial spaces and ventricular dilatation. There are areas of decreased attenuation within the white matter tracts of the supratentorial brain, consistent with microvascular disease changes. Vascular: No hyperdense vessel or unexpected calcification. Skull: Normal. Negative for fracture or focal lesion. Sinuses/Orbits: There is marked severity sphenoid sinus mucosal thickening. Other: None. IMPRESSION: 1. Generalized cerebral atrophy. 2. No acute intracranial abnormality. Electronically Signed   By: Aram Candela M.D.   On: 10/28/2019 22:31   DG Chest Port 1 View  Result Date: 10/28/2019 CLINICAL DATA:  Altered mental status. EXAM: PORTABLE CHEST 1 VIEW COMPARISON:  11/07/2017, lung bases from abdominal CT 02/21/2015. FINDINGS: Patient is leaning to the left. 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 This is stable in size from 2016. The cardiomediastinal contours are normal. Aortic atherosclerosis. Pulmonary vasculature is normal. No consolidation, pleural effusion, or pneumothorax. No acute osseous abnormalities are seen. IMPRESSION: No acute abnormality. Electronically Signed   By: Narda Rutherford M.D.   On: 10/28/2019 22:16      Joycelyn Das, MD  [facility] 10/29/2019

## 2019-10-29 NOTE — ED Notes (Signed)
Pt transferred to hospital bed. New bed linen. Feet elevated. Bed lowest position. Pt comfortable

## 2019-10-29 NOTE — ED Notes (Signed)
This RN spoke with pt's niece, Shawna Orleans: [phone] with pt's permission.

## 2019-10-29 NOTE — ED Notes (Signed)
A family member called this RN's work phone directly to "check on Carolyn Bishop." This RN placed her on hold while in another pt's room, once out of the room the pt had hung up. Will attempt to call family member back.

## 2019-10-29 NOTE — Progress Notes (Signed)
10/29/19 1147  PT Visit Information  Last PT Received On 10/29/19  Assistance Needed +2  History of Present Illness Pt is an 86 y/o female admitted secondary to increased confusion. Found to have acute encephalopathy secondary to UTI. PMH includes DM, HTN, and CHF.   Precautions  Precautions Fall  Restrictions  Weight Bearing Restrictions No  Home Living  Family/patient expects to be discharged to: Private residence  Living Arrangements Other relatives (sisters)  Available Help at Discharge Family  Type of [place] to enter  Entrance Stairs-Number of Steps 4-5  Entrance Stairs-Rails  (reports she has a rail )  Home Layout One level (reports she has to walk a distance. )  Psychiatric nurse - 4 wheels  Prior Function  Level of Independence Independent with assistive device(s)  Comments Uses RW. Initially reports she was independent but reports she was using RW once moving into different setting?  Communication  Communication No difficulties  Pain Assessment  Pain Assessment No/denies pain  Cognition  Arousal/Alertness Awake/alert  Behavior During Therapy WFL for tasks assessed/performed  Overall Cognitive Status No family/caregiver present to determine baseline cognitive functioning  General Comments Pt with slow processing. Initially reporting it was may, but able to correct to June. Had difficulty answering some home questions  Upper Extremity Assessment  Upper Extremity Assessment Defer to OT evaluation  Lower Extremity Assessment  Lower Extremity Assessment Generalized weakness  Cervical / Trunk Assessment  Cervical / Trunk Assessment Kyphotic  Bed Mobility  Overal bed mobility Needs Assistance  Bed Mobility Supine to Sit;Sit to Supine  Supine to sit Mod assist  Sit to supine Mod assist  General bed mobility comments Mod A for LE assist and assist to scoot forwards.   Transfers  General transfer  comment Attempted to stand, however, pts feet sliding forwards and was unable to safely stand with +1. Pt also reports feeling much weaker.   Balance  Overall balance assessment Needs assistance  Sitting-balance support No upper extremity supported;Feet supported  Sitting balance-Leahy Scale Fair  PT - End of Session  Equipment Utilized During Treatment Gait belt  Activity Tolerance Patient tolerated treatment well  Patient left in bed;with call bell/phone within reach (in ED )  Nurse Communication Mobility status  PT Assessment  PT Recommendation/Assessment Patient needs continued PT services  PT Visit Diagnosis Unsteadiness on feet (R26.81);Muscle weakness (generalized) (M62.81);Difficulty in walking, not elsewhere classified (R26.2)  PT Problem List Decreased strength;Decreased balance;Decreased mobility;Decreased knowledge of use of DME;Decreased cognition;Decreased knowledge of precautions;Decreased safety awareness;Decreased activity tolerance  PT Plan  PT Frequency (ACUTE ONLY) Min 2X/week  PT Treatment/Interventions (ACUTE ONLY) DME instruction;Gait training;Functional mobility training;Therapeutic exercise;Therapeutic activities;Balance training;Patient/family education;Stair training  AM-PAC PT "6 Clicks" Mobility Outcome Measure (Version 2)  Help needed turning from your back to your side while in a flat bed without using bedrails? 2  Help needed moving from lying on your back to sitting on the side of a flat bed without using bedrails? 2  Help needed moving to and from a bed to a chair (including a wheelchair)? 1  Help needed standing up from a chair using your arms (e.g., wheelchair or bedside chair)? 1  Help needed to walk in hospital room? 1  Help needed climbing 3-5 steps with a railing?  1  6 Click Score 8  Consider Recommendation of Discharge To: CIR/SNF/LTACH  PT Recommendation  Follow Up Recommendations SNF  PT equipment Other (comment) (TBD)  Individuals Consulted   Consulted and Agree with Results and Recommendations Patient  Acute Rehab PT Goals  Patient Stated Goal none stated  PT Goal Formulation With patient  Time For Goal Achievement 11/12/19  Potential to Achieve Goals Good  PT Time Calculation  PT Start Time (ACUTE ONLY) 1145  PT Stop Time (ACUTE ONLY) 1205  PT Time Calculation (min) (ACUTE ONLY) 20 min  PT General Charges  $$ ACUTE PT VISIT 1 Visit  PT Evaluation  $PT Eval Moderate Complexity 1 Mod   Pt admitted secondary to problem above with deficits below. Pt requiring mod A to perform bed mobility tasks. Attempted to stand, however, pt's feet slipping forward and pt unable to assist with power up with +1. Pt with cognitive deficits and had difficulty answering home information questions. Feel pt would benefit from SNF level therapies at d/c. Will continue to follow acutely to maximize functional mobility independence and safety.   Farley Ly, PT, DPT  Acute Rehabilitation Services  Pager: [phone] Office: [phone]

## 2019-10-29 NOTE — ED Notes (Signed)
Lunch tray set up for pt.

## 2019-10-29 NOTE — ED Notes (Signed)
Lunch tray delivered.

## 2019-10-30 DIAGNOSIS — R5381 Other malaise: Secondary | ICD-10-CM

## 2019-10-30 LAB — BASIC METABOLIC PANEL
Anion gap: 10 (ref 5–15)
BUN: 25 mg/dL — ABNORMAL HIGH (ref 8–23)
CO2: 23 mmol/L (ref 22–32)
Calcium: 9.2 mg/dL (ref 8.9–10.3)
Chloride: 109 mmol/L (ref 98–111)
Creatinine, Ser: 0.98 mg/dL (ref 0.44–1.00)
GFR calc Af Amer: >60 mL/min (ref >60)
GFR calc non Af Amer: 53 mL/min — ABNORMAL LOW (ref >60)
Glucose, Bld: 76 mg/dL (ref 70–99)
Potassium: 3.8 mmol/L (ref 3.5–5.1)
Sodium: 142 mmol/L (ref 135–145)

## 2019-10-30 LAB — HEMOGLOBIN A1C
Hgb A1c MFr Bld: 6.3 % — ABNORMAL HIGH (ref 4.8–5.6)
Mean Plasma Glucose: 134.11 mg/dL

## 2019-10-30 LAB — GLUCOSE, CAPILLARY
Glucose-Capillary: 68 mg/dL — ABNORMAL LOW (ref 70–99)
Glucose-Capillary: 158 mg/dL — ABNORMAL HIGH (ref 70–99)
Glucose-Capillary: 153 mg/dL — ABNORMAL HIGH (ref 70–99)
Glucose-Capillary: 336 mg/dL — ABNORMAL HIGH (ref 70–99)

## 2019-10-30 LAB — CBC
HCT: 33.1 % — ABNORMAL LOW (ref 36.0–46.0)
Hemoglobin: 10.8 g/dL — ABNORMAL LOW (ref 12.0–15.0)
MCH: 29.9 pg (ref 26.0–34.0)
MCHC: 32.6 g/dL (ref 30.0–36.0)
MCV: 91.7 fL (ref 80.0–100.0)
Platelets: 168 10*3/uL (ref 150–400)
RBC: 3.61 MIL/uL — ABNORMAL LOW (ref 3.87–5.11)
RDW: 12.1 % (ref 11.5–15.5)
WBC: 4.6 10*3/uL (ref 4.0–10.5)
nRBC: 0 % (ref 0.0–0.2)

## 2019-10-30 LAB — MAGNESIUM: Magnesium: 1.7 mg/dL (ref 1.7–2.4)

## 2019-10-30 LAB — TSH: TSH: 0.949 u[IU]/mL (ref 0.350–4.500)

## 2019-10-30 LAB — VITAMIN B12: Vitamin B-12: 237 pg/mL (ref 180–914)

## 2019-10-30 MED ORDER — PANTOPRAZOLE SODIUM 40 MG PO TBEC
40.0000 mg | DELAYED_RELEASE_TABLET | Freq: Every day | ORAL | Status: DC
  Administered 2019-10-31 – 2019-11-01 (×2): 40 mg via ORAL
  Filled 2019-10-30 (×2): qty 1

## 2019-10-30 MED ORDER — INSULIN ASPART 100 UNIT/ML ~~LOC~~ SOLN
3.0000 [IU] | Freq: Once | SUBCUTANEOUS | Status: AC
  Administered 2019-10-30: 3 [IU] via SUBCUTANEOUS

## 2019-10-30 MED ORDER — INSULIN ASPART 100 UNIT/ML ~~LOC~~ SOLN
0.0000 [IU] | Freq: Three times a day (TID) | SUBCUTANEOUS | Status: DC
  Administered 2019-10-30 (×2): 1 [IU] via SUBCUTANEOUS
  Administered 2019-10-31: 2 [IU] via SUBCUTANEOUS
  Administered 2019-11-01 (×2): 1 [IU] via SUBCUTANEOUS

## 2019-10-30 NOTE — Progress Notes (Addendum)
PROGRESS NOTE  Carolyn Bishop OEV:[id number] DOB: Nov 10, 1932 DOA: 10/28/2019 PCP: Willey Blade, MD   LOS: 2 days   Brief narrative: As per HPI,  Carolyn Bishop is a 86 y.o. female with past medical history of of DM type II and HTN was brought to ED by EMS for confusion and admitted to the hospital for acute encephalopathy presumed secondary to UTI. Admitting physician was unable to reach the family. Patient was reportedly brought in by EMS from home for confusion that started this morning. Family dropped her off and said to call with questions. In the ED: Labile pressures otherwise stable on room air; afebrile. Labs remarkable for WBC 5.3, Hgb 11.1, glucose 148 otherwise CMP WNL, Lactate normal limits, UA with leuks/nitrites. Blood cultures were drawn. CT Head was non-acute. Patient given 500 mL IVF and Rocephin and called for admission for altered mental status presumed secondary to UTI.  Assessment/Plan:  Principal Problem:   Acute encephalopathy Active Problems:   Diabetes mellitus without complication (HCC)   Essential hypertension   Acute cystitis   Grade I diastolic dysfunction  Acute metabolic encephalopathy secondary to Acute Cystitis Resolved.  Likely at her baseline at this time.  Possibly secondary to UTI.  On IV Rocephin.  We will continue to complete the course.   Chronic diastolic congestive heart failure.   2D echocardiogram reviewed from June 2019 showed grade I diastolic dysfunction.  Compensated at this time.  Essential HTN No medications in the med rec.  Will monitor.   Diabetes mellitus type II. Mention of glipizide as per the admitting provider but as per med rec no medications.  Continue sliding scale insulin, Accu-Cheks diabetic diet.  Check A1c.  Debility, generalized weakness/deconditioning.  Will check TSH A1c.  Physical therapy on board for rehabilitation.  DVT prophylaxis: enoxaparin (LOVENOX) injection 40 mg Start: 10/29/19 1000  Code Status: Full  code  Family Communication: Unable to reach the patient's sisters on the phone despite multiple attempts again today. Nonfunctioning numbers.  Transition of care has been consulted  Status is: Inpatient  Remains inpatient appropriate because:Unsafe d/c plan, physical therapy recommended skilled nursing facility placement at this time.  Unable to reach the family members.   Dispo: The patient is from: Home              Anticipated d/c is to: Home versus skilled nursing facility placement.  Transition of care care consulted.              Anticipated d/c date is: 2 days              Patient currently is medically stable to d/c.   Consultants: None  Procedures: None  Antibiotics:  Rocephin IV> 10/28/2019   Subjective: The, patient was seen and examined at bedside.  Patient denies any fever, chills, nausea, vomiting.  Alert awake and communicative.  Has generalized weakness.    Objective: Vitals:   10/30/19 0617 10/30/19 0952  BP: (!) 170/67 (!) 148/53  Pulse: 72 72  Resp: 16 18  Temp: 97.8 F (36.6 C) 98 F (36.7 C)  SpO2: 100% 98%    Intake/Output Summary (Last 24 hours) at 10/30/2019 1138 Last data filed at 10/30/2019 0900 Gross per 24 hour  Intake 700 ml  Output 900 ml  Net -200 ml   Filed Weights   10/28/19 1933  Weight: 70 kg   Body mass index is 26.49 kg/m.   Physical Exam: GENERAL: Patient is alert awake and oriented. Not in obvious distress.  Elderly female, HENT: No scleral pallor or icterus. Pupils equally reactive to light. Oral mucosa is moist NECK: is supple, no gross swelling noted. CHEST: Clear to auscultation. No crackles or wheezes.  Diminished breath sounds bilaterally. CVS: S1 and S2 heard, no murmur. Regular rate and rhythm.  ABDOMEN: Soft, non-tender, bowel sounds are present. EXTREMITIES: No edema. CNS: Cranial nerves are intact.  Moving all extremities. SKIN: warm and dry without rashes.  Data Review: I have personally reviewed the  following laboratory data and studies,  CBC: Recent Labs  Lab 10/28/19 1941 10/29/19 0447 10/30/19 0615  WBC 5.3 4.3 4.6  NEUTROABS 3.8  --   --   HGB 11.1* 11.5* 10.8*  HCT 34.8* 36.4 33.1*  MCV 94.3 94.5 91.7  PLT 197 185 168   Basic Metabolic Panel: Recent Labs  Lab 10/28/19 1941 10/29/19 0447 10/30/19 0615  NA 142  --  142  K 4.0  --  3.8  CL 107  --  109  CO2 26  --  23  GLUCOSE 148*  --  76  BUN 31*  --  25*  CREATININE 0.95  --  0.98  CALCIUM 10.0  --  9.2  MG  --  1.8 1.7  PHOS  --  3.5  --    Liver Function Tests: Recent Labs  Lab 10/28/19 1941  AST 27  ALT 21  ALKPHOS 65  BILITOT 0.7  PROT 7.1  ALBUMIN 3.6   No results for input(s): LIPASE, AMYLASE in the last 168 hours. No results for input(s): AMMONIA in the last 168 hours. Cardiac Enzymes: No results for input(s): CKTOTAL, CKMB, CKMBINDEX, TROPONINI in the last 168 hours. BNP (last 3 results) No results for input(s): BNP in the last 8760 hours.  ProBNP (last 3 results) No results for input(s): PROBNP in the last 8760 hours.  CBG: Recent Labs  Lab 10/29/19 1155 10/29/19 1623 10/29/19 2055 10/30/19 0639 10/30/19 1128  GLUCAP 99 133* 271* 68* 158*   Recent Results (from the past 240 hour(s))  Culture, blood (routine x 2)     Status: None (Preliminary result)   Collection Time: 10/28/19  9:58 PM   Specimen: BLOOD  Result Value Ref Range Status   Specimen Description BLOOD RIGHT HAND  Final   Special Requests   Final    BOTTLES DRAWN AEROBIC AND ANAEROBIC Blood Culture results may not be optimal due to an inadequate volume of blood received in culture bottles   Culture   Final    NO GROWTH 2 DAYS Performed at [facility], [address], [place], [place] [address]    Report Status PENDING  Incomplete  Urine culture     Status: Abnormal (Preliminary result)   Collection Time: 10/28/19 10:02 PM   Specimen: Urine, Catheterized  Result Value Ref Range Status   Specimen  Description URINE, CATHETERIZED  Final   Special Requests NONE  Final   Culture (A)  Final    >=100,000 COLONIES/mL ESCHERICHIA COLI SUSCEPTIBILITIES TO FOLLOW Performed at [facility], [address], [place], [place] [address]    Report Status PENDING  Incomplete  SARS Coronavirus 2 by RT PCR (hospital order, performed in [facility] hospital lab) Nasopharyngeal Nasopharyngeal Swab     Status: None   Collection Time: 10/28/19 10:33 PM   Specimen: Nasopharyngeal Swab  Result Value Ref Range Status   SARS Coronavirus 2 NEGATIVE NEGATIVE Final    Comment: (NOTE) SARS-CoV-2 target nucleic acids are NOT  DETECTED.  The SARS-CoV-2 RNA is generally detectable in upper and lower respiratory specimens during the acute phase of infection. The lowest concentration of SARS-CoV-2 viral copies this assay can detect is 250 copies / mL. A negative result does not preclude SARS-CoV-2 infection and should not be used as the sole basis for treatment or other patient management decisions.  A negative result may occur with improper specimen collection / handling, submission of specimen other than nasopharyngeal swab, presence of viral mutation(s) within the areas targeted by this assay, and inadequate number of viral copies (<250 copies / mL). A negative result must be combined with clinical observations, patient history, and epidemiological information.  Fact Sheet for Patients:   BoilerBrush.com.cy  Fact Sheet for Healthcare Providers: https://pope.com/  This test is not yet approved or  cleared by the [place] FDA and has been authorized for detection and/or diagnosis of SARS-CoV-2 by FDA under an Emergency Use Authorization (EUA).  This EUA will remain in effect (meaning this test can be used) for the duration of the COVID-19 declaration under Section 564(b)(1) of the Act, 21 U.S.C. section 360bbb-3(b)(1), unless the authorization is  terminated or revoked sooner.  Performed at [facility], [address] [place], [place] [address]   Culture, blood (routine x 2)     Status: None (Preliminary result)   Collection Time: 10/28/19 10:51 PM   Specimen: BLOOD  Result Value Ref Range Status   Specimen Description BLOOD LEFT FOREARM  Final   Special Requests   Final    BOTTLES DRAWN AEROBIC AND ANAEROBIC Blood Culture adequate volume   Culture   Final    NO GROWTH 2 DAYS Performed at [facility], [address] [place], [place] [address]    Report Status PENDING  Incomplete     Studies: CT Head Wo Contrast  Result Date: 10/28/2019 CLINICAL DATA:  Altered mental status. EXAM: CT HEAD WITHOUT CONTRAST TECHNIQUE: Contiguous axial images were obtained from the base of the skull through the vertex without intravenous contrast. COMPARISON:  July 10, 2004 FINDINGS: Brain: There is moderate severity cerebral atrophy with widening of the extra-axial spaces and ventricular dilatation. There are areas of decreased attenuation within the white matter tracts of the supratentorial brain, consistent with microvascular disease changes. Vascular: No hyperdense vessel or unexpected calcification. Skull: Normal. Negative for fracture or focal lesion. Sinuses/Orbits: There is marked severity sphenoid sinus mucosal thickening. Other: None. IMPRESSION: 1. Generalized cerebral atrophy. 2. No acute intracranial abnormality. Electronically Signed   By: Aram Candela M.D.   On: 10/28/2019 22:31   DG Chest Port 1 View  Result Date: 10/28/2019 CLINICAL DATA:  Altered mental status. EXAM: PORTABLE CHEST 1 VIEW COMPARISON:  11/07/2017, lung bases from abdominal CT 02/21/2015. FINDINGS: Patient is leaning to the left. 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 This is stable in size from 2016. The cardiomediastinal contours are normal. Aortic atherosclerosis. Pulmonary vasculature is normal. No  consolidation, pleural effusion, or pneumothorax. No acute osseous abnormalities are seen. IMPRESSION: No acute abnormality. Electronically Signed   By: Narda Rutherford M.D.   On: 10/28/2019 22:16      Joycelyn Das, MD  [facility] 10/30/2019

## 2019-10-30 NOTE — TOC Initial Note (Signed)
Transition of Care [facility]) - Initial/Assessment Note    Patient Details  Name: Carolyn Bishop MRN: [id number] Date of Birth: [birth year]  Transition of Care [facility]) CM/SW Contact:    Sable Feil, LCSW Phone Number: 10/30/2019, 4:56 PM  Clinical Narrative:  CSW visited with patient at the bedside and talked with her about her discharge  and recommendation of ST rehab by PT/OT. Carolyn Bishop was sitting up in the chair at bedside and was awake, alert and pleasantly confused at times, very pleasant demeanor and agreeable to talking with CSW. When asked, patient responded that she has never been to a skilled facility for ST rehab, but has had rehab at home. CSW explained reason for visit and recommendation of ST rehab at a skilled facility. Carolyn Bishop responded pleasantly that she is tired and would want to go home and rest before rehab. She stated several times during the conversation that she "feels a little tired".  Patient gave CSW permission to contact her niece or sisters while in the room with her to discuss the discharge recommendation.  CSW called patient's niece Verner Chol [phone]) and left a message. Calls made to sister Trevor Mace and got message, "call cannot be completed as dialed" and Carolyn Bishop and got message "you have reached a number that is disconnected or no longer in service".     CSW received a call from niece (5:02 pm) and SNF recommendation explained. Carolyn Bishop expressed agreement with rehab and explained that her mom and aunts live together and she lives across the yard and helps them out. Carolyn Bishop added that she works full-time.  Carolyn Bishop explained that her mom Carolyn Bishop and aunt Trevor Mace can't take care of their sister and she needs therapy to help her get stronger as she has fallen several times and her legs are weak.  CarolynMartin added that due to falling in the home, her aunt Carolyn Bishop is afraid to go outside. She further indicated that her aunt can go to the bathroom  unassisted.    CSW explained the facility search process and advised Carolyn Bishop that the Medicare.gov SNF list left in the room in the top drawer of the dresser in the room. The facility search process was explained.           Expected Discharge Plan: [facility] Barriers to Discharge: Other (comment) (Discharge plan not determined at this time)   Patient Goals and CMS Choice Patient states their goals for this hospitalization and ongoing recovery are:: Patient wants to discharge home, rest for a day, then start rehab at home CMS Medicare.gov Compare Post Acute Care list provided to:: Other (Comment Required) (Left Medicare.gov list with patient) Choice offered to / list presented to : Patient  Expected Discharge Plan and Services Expected Discharge Plan: [place] In-house Referral: Clinical Social Work     Living arrangements for the past 2 months: Carolyn Bishop (patient lives with her sisters)                                      Prior Living Arrangements/Services Living arrangements for the past 2 months: Single Family Home (patient lives with her sisters) Lives with:: Siblings Patient language and need for interpreter reviewed:: No Do you feel safe going back to the place where you live?: Yes      Need for Family Participation in Patient Care: Yes (  Comment) Care giver support system in place?: No (comment)   Criminal Activity/Legal Involvement Pertinent to Current Situation/Hospitalization: No - Comment as needed  Activities of Daily Living Home Assistive Devices/Equipment: None ADL Screening (condition at time of admission) Patient's cognitive ability adequate to safely complete daily activities?: Yes Is the patient deaf or have difficulty hearing?: No Does the patient have difficulty seeing, even when wearing glasses/contacts?: No Does the patient have difficulty concentrating, remembering, or making decisions?: No Patient  able to express need for assistance with ADLs?: Yes Does the patient have difficulty dressing or bathing?: Yes Independently performs ADLs?: Yes (appropriate for developmental age) Does the patient have difficulty walking or climbing stairs?: Yes Weakness of Legs: Both Weakness of Arms/Hands: Both  Permission Sought/Granted Permission sought to share information with : Family Supports (Patient gave CSW permission to talk with her niece or her sisters) Permission granted to share information with : Yes, Verbal Permission Granted  Share Information with NAME: Niece Madison Hickman - [phone]; Sister Rosaria Ferries - [phone]; Sister Aura Camps - [phone]     Permission granted to share info w Relationship: Niece Shawna Orleans and sisters Cone and Ala Dach     Emotional Assessment Appearance:: Appears stated age Attitude/Demeanor/Rapport: Engaged Affect (typically observed): Pleasant Orientation: : Oriented to Self, Oriented to Place, Oriented to Situation Alcohol / Substance Use: Tobacco Use, Alcohol Use, Illicit Drugs (Patient reported that she has never smoked and does not drink or use illicit drugs) Psych Involvement: No (comment)  Admission diagnosis:  Encephalopathy acute [G93.40] Acute encephalopathy [G93.40] Lower urinary tract infection, acute [N39.0] Patient Active Problem List   Diagnosis Date Noted  . Acute encephalopathy 10/28/2019  . Acute cystitis 10/28/2019  . Grade I diastolic dysfunction 10/28/2019  . HLD (hyperlipidemia) 10/28/2017  . Acute urinary retention   . AKI (acute kidney injury) (HCC) 10/27/2017  . Hypotension 10/27/2017  . Diabetes mellitus without complication (HCC) 10/27/2017  . Essential hypertension 10/27/2017  . Hypoglycemia 10/27/2017  . Normocytic anemia 10/27/2017  . GIB (gastrointestinal bleeding) 10/27/2017  . Lung mass 10/27/2017  . Lactic acid acidosis 10/27/2017  . Left arm weakness 10/27/2017   PCP:  Andi Devon,  MD Pharmacy:   [facility] - [place], [place] - [address] [place] [address] Phone: [phone] Fax: [phone]  [facility] [phone] - [place], [place] - [address] AT [facility] [address] & [address] [address] Phone: [phone] Fax: [phone]     Social Determinants of Health (SDOH) Interventions  No SDOH interventions requested or needed at this time.  Readmission Risk Interventions No flowsheet data found.

## 2019-10-30 NOTE — Progress Notes (Signed)
 Occupational Therapy Evaluation Patient Details Name: Carolyn Bishop MRN: [id number] DOB: 11/21/1932 Today's Date: 10/30/2019    History of Present Illness Pt is an 86 y/o female admitted secondary to increased confusion. Found to have acute encephalopathy secondary to UTI. PMH includes DM, HTN, and CHF.    Clinical Impression   Pt presents with decreased strength, endurance, standing balance, and cognitive deficits impacting safety in completing daily tasks. Pt lives with sisters and reports typically able to use Rollator and complete ADLs, but later reports requiring assistance with ADLs. Presently, pt requires Max A + 2 for sit to stand with RW, Mod A + 2 for sit to stand in [place]. Pt requires grossly Mod A for LB ADLs with intermittent cues for problem-solving and difficulties with incontinence. Based on pt's current presentation, recommend SNF for short term rehab prior to return home. Will continue to follow acutely.     Follow Up Recommendations  SNF;Supervision/Assistance - 24 hour    Equipment Recommendations  3 in 1 bedside commode    Recommendations for Other Services       Precautions / Restrictions Precautions Precautions: Fall Restrictions Weight Bearing Restrictions: No      Mobility Bed Mobility Overal bed mobility: Needs Assistance Bed Mobility: Supine to Sit     Supine to sit: Mod assist     General bed mobility comments: Mod A for LE advancement with cues for hand placement and proper sequencing   Transfers Overall transfer level: Needs assistance Equipment used: Rolling walker (2 wheeled) Transfers: Sit to/from Stand Sit to Stand: Max assist;+2 physical assistance         General transfer comment: Max A for sit to stand with RW at bedside with cues for optimal body posturing. Pt improved to Mod A for sit to stand with Stedy and transferred to recliner chair with [employer] Overall balance assessment: Needs assistance Sitting-balance support:  No upper extremity supported;Feet supported Sitting balance-Leahy Scale: Fair Sitting balance - Comments: pt demo attempts to reach B LE during sock donning    Standing balance support: Bilateral upper extremity supported Standing balance-Leahy Scale: Poor Standing balance comment: Pt required physical assist to maintain standing balance with RW demo posterior lean and unsteadiness                           ADL either performed or assessed with clinical judgement   ADL Overall ADL's : Needs assistance/impaired Eating/Feeding: Set up;Sitting   Grooming: Set up;Sitting   Upper Body Bathing: Minimal assistance;Sitting   Lower Body Bathing: Moderate assistance;Sitting/lateral leans;Sit to/from stand Lower Body Bathing Details (indicate cue type and reason): Pt overall Mod A, demo ability to wash upper B LE while sitting EOB and some peri care Upper Body Dressing : Minimal assistance;Sitting Upper Body Dressing Details (indicate cue type and reason): Min A to don hospital gown sitting EOB with line mgmt Lower Body Dressing: Maximal assistance;Sit to/from stand;Sitting/lateral leans Lower Body Dressing Details (indicate cue type and reason): Pt overall Max A to don socks sitting EOB. Initially, pt attempted to put socks on hands when OT gave pt socks. Pt attempted to don using figure four position, but unable to successfully complete task with assistance Toilet Transfer: Maximal assistance;+2 for physical assistance;BSC   Toileting- Clothing Manipulation and Hygiene: Sit to/from stand;Sitting/lateral lean;Moderate assistance Toileting - Clothing Manipulation Details (indicate cue type and reason): Overall Mod A for cleanup after urine incontinence. Pt demo ability to  clean anterior peri region sitting EOB        General ADL Comments: Pt demonstrates decreased strength in standing for LB ADLs, as well as decreased endurance and problem solving throughout.     Vision Baseline  Vision/History: No visual deficits Patient Visual Report: No change from baseline       Perception     Praxis      Pertinent Vitals/Pain Pain Assessment: No/denies pain     Hand Dominance Right   Extremity/Trunk Assessment Upper Extremity Assessment Upper Extremity Assessment: Generalized weakness   Lower Extremity Assessment Lower Extremity Assessment: Defer to PT evaluation   Cervical / Trunk Assessment Cervical / Trunk Assessment: Kyphotic   Communication Communication Communication: No difficulties   Cognition Arousal/Alertness: Awake/alert Behavior During Therapy: WFL for tasks assessed/performed;Anxious Overall Cognitive Status: No family/caregiver present to determine baseline cognitive functioning                                 General Comments: Pt oriented to person, place, time. However, pt's reports of PLOF varied throughout session.    General Comments  Pt reported "feeling funny" when moving to EOB. Assessed BP sitting EOB and standing at 170/60s.     Exercises     Shoulder Instructions      Home Living Family/patient expects to be discharged to:: Private residence Living Arrangements: Other relatives (sisters) Available Help at Discharge: Family Type of Home: House Home Access: Stairs to enter Technical  of Steps: 3-4 Entrance Stairs-Rails: Left;Right Home Layout: One level (ramp up to her bedroom in home)     Bathroom Shower/Tub: Teacher, early years/pre: Standard     Home Equipment: Bedside commode;Walker - 4 wheels          Prior Functioning/Environment Level of Independence: Independent with assistive device(s)        Comments: Uses RW for mobility, Modified Independent with ADLs prior to most recent decline. pt later reported niece assisting with bathing tasks, etc recently         OT Problem List: Decreased strength;Decreased activity tolerance;Impaired balance (sitting and/or  standing);Decreased cognition;Decreased safety awareness;Decreased knowledge of use of DME or AE      OT Treatment/Interventions: Self-care/ADL training;Therapeutic exercise;Energy conservation;DME and/or AE instruction;Therapeutic activities;Patient/family education    OT Goals(Current goals can be found in the care plan section) Acute Rehab OT Goals Patient Stated Goal: make progress OT Goal Formulation: With patient Time For Goal Achievement: 11/13/19 Potential to Achieve Goals: Good ADL Goals Pt Will Perform Grooming: with min assist;standing Pt Will Perform Lower Body Bathing: with min assist;sit to/from stand;sitting/lateral leans Pt Will Perform Lower Body Dressing: with mod assist;sit to/from stand;sitting/lateral leans Pt Will Transfer to Toilet: with mod assist;stand pivot transfer;bedside commode Pt Will Perform Toileting - Clothing Manipulation and hygiene: with min assist;sitting/lateral leans;sit to/from stand  OT Frequency: Min 2X/week   Barriers to D/C:            Co-evaluation              AM-PAC OT "6 Clicks" Daily Activity     Outcome Measure Help from another person eating meals?: A Little Help from another person taking care of personal grooming?: A Little Help from another person toileting, which includes using toliet, bedpan, or urinal?: A Lot Help from another person bathing (including washing, rinsing, drying)?: A Lot Help from another person to put on and taking off regular upper  body clothing?: A Little Help from another person to put on and taking off regular lower body clothing?: A Lot 6 Click Score: 15   End of Session Equipment Utilized During Treatment: Gait belt;Rolling walker Nurse Communication: Mobility status;Other (comment) (BP readings )  Activity Tolerance: Patient tolerated treatment well Patient left: in chair;with call bell/phone within reach;with chair alarm set  OT Visit Diagnosis: Unsteadiness on feet (R26.81);Muscle weakness  (generalized) (M62.81);History of falling (Z91.81)                Time: 0315-9458 OT Time Calculation (min): 44 min Charges:  OT General Charges $OT Visit: 1 Visit OT Evaluation $OT Eval Moderate Complexity: 1 Mod OT Treatments $Self Care/Home Management : 8-22 mins $Therapeutic Activity: 8-22 mins  Lorre Munroe, OTR/L  Lorre Munroe 10/30/2019, 11:15 AM

## 2019-10-30 NOTE — Plan of Care (Signed)
  Problem: Safety: Goal: Ability to remain free from injury will improve Outcome: Progressing   Problem: Urinary Elimination: Goal: Signs and symptoms of infection will decrease Outcome: Progressing   Problem: Skin Integrity: Goal: Risk for impaired skin integrity will decrease Outcome: Adequate for Discharge

## 2019-10-30 NOTE — Progress Notes (Signed)
Inpatient Diabetes Program Recommendations  AACE/ADA: New Consensus Statement on Inpatient Glycemic Control (2015)  Target Ranges:  Prepandial:   less than 140 mg/dL      Peak postprandial:   less than 180 mg/dL (1-2 hours)      Critically ill patients:  140 - 180 mg/dL   Lab Results  Component Value Date   GLUCAP 158 (H) 10/30/2019   HGBA1C 4.8 10/28/2017    Review of Glycemic Control  Results for [name], Carolyn Bishop (MRN [id number]) as of 10/30/2019 11:34  Ref. Range 10/29/2019 07:56 10/29/2019 09:41 10/29/2019 10:30 10/29/2019 11:55 10/29/2019 16:23 10/29/2019 20:55 10/30/2019 06:39 10/30/2019 11:28  Glucose-Capillary Latest Ref Range: 70 - 99 mg/dL 66 (L) 60 (L) 350 (H) 99 133 (H) 271 (H) 68 (L) 158 (H)   Diabetes history:  DM2  Outpatient Diabetes medications:  Glipizide??  Current orders for Inpatient glycemic control:  Novolog 0-9 tid & hs  Inpatient Diabetes Program Recommendations:     Patient seems very sensitive to insulin.  Please consider,  Novolog very sensitive scale 0-6 units tid & hs Recommend obtaining current A1C  Will continue to follow while inpatient.  Thank you, Dulce Sellar, RN, BSN Diabetes Coordinator Inpatient Diabetes Program [phone] (team pager from 8a-5p)

## 2019-10-30 NOTE — Progress Notes (Signed)
  Speech Language Pathology   Patient Details Name: Carolyn Bishop MRN: [id number] DOB: 1932/07/09 Today's Date: 10/30/2019 Time:  -     Pt passed RN swallow screen and is on diet. Does not appear to have need for formal assessment.  GO                Royce Macadamia 10/30/2019, 10:21 AM  Breck Coons Lonell Face.Ed Nurse, children's [phone] Office [phone]

## 2019-10-31 LAB — URINE CULTURE: Culture: >=100000 — AB

## 2019-10-31 LAB — GLUCOSE, CAPILLARY
Glucose-Capillary: 127 mg/dL — ABNORMAL HIGH (ref 70–99)
Glucose-Capillary: 216 mg/dL — ABNORMAL HIGH (ref 70–99)
Glucose-Capillary: 136 mg/dL — ABNORMAL HIGH (ref 70–99)
Glucose-Capillary: 191 mg/dL — ABNORMAL HIGH (ref 70–99)

## 2019-10-31 MED ORDER — AMLODIPINE BESYLATE 5 MG PO TABS
5.0000 mg | ORAL_TABLET | Freq: Every day | ORAL | Status: DC
  Administered 2019-10-31 – 2019-11-01 (×2): 5 mg via ORAL
  Filled 2019-10-31 (×2): qty 1

## 2019-10-31 NOTE — NC FL2 (Signed)
[place] MEDICAID FL2 LEVEL OF CARE SCREENING TOOL     IDENTIFICATION  Patient Name: Carolyn Bishop Birthdate: November 20, 1932 Sex: female Admission Date (Current Location): 10/28/2019  [facility] and [place] Number:  Producer, television/film/video and Address:  [facility], [address], [place], [place] [address]      Provider Number: [id number]  Attending Physician Name and Address:  Joycelyn Das, MD  Relative Name and Phone Number:  Madison Hickman - niece; [phone]    Current Level of Care: Hospital Recommended Level of Care: Skilled Nursing Facility Prior Approval Number:    Date Approved/Denied:   PASRR Number: [id number] (MUST WL#[id number] - eff. 10/31/19)  Discharge Plan: SNF    Current Diagnoses: Patient Active Problem List   Diagnosis Date Noted  . Acute encephalopathy 10/28/2019  . Acute cystitis 10/28/2019  . Grade I diastolic dysfunction 10/28/2019  . HLD (hyperlipidemia) 10/28/2017  . Acute urinary retention   . AKI (acute kidney injury) (HCC) 10/27/2017  . Hypotension 10/27/2017  . Diabetes mellitus without complication (HCC) 10/27/2017  . Essential hypertension 10/27/2017  . Hypoglycemia 10/27/2017  . Normocytic anemia 10/27/2017  . GIB (gastrointestinal bleeding) 10/27/2017  . Lung mass 10/27/2017  . Lactic acid acidosis 10/27/2017  . Left arm weakness 10/27/2017    Orientation RESPIRATION BLADDER Height & Weight     Self, Situation, Place, Time  Normal Incontinent Weight: 139 lb 15.9 oz (63.5 kg) Height:  5\' 4"  (162.6 cm)  BEHAVIORAL SYMPTOMS/MOOD NEUROLOGICAL BOWEL NUTRITION STATUS      Continent Diet (Carb modified)  AMBULATORY STATUS COMMUNICATION OF NEEDS Skin   Total Care (Patient was unable to ambulate with PT on 621) Verbally Normal                       Personal Care Assistance Level of Assistance  Bathing, Feeding, Dressing Bathing Assistance: Maximum assistance (Upper body min assist and Lower body mod  assist) Feeding assistance: Limited assistance (Assistance with set-up) Dressing Assistance: Maximum assistance (Upper body min assist; Lower body max assist)     Functional Limitations Info  Sight, Hearing, Speech Sight Info: Impaired (Wears glasses for reading) Hearing Info: Adequate Speech Info: Adequate    SPECIAL CARE FACTORS FREQUENCY  PT (By licensed PT), OT (By licensed OT)     PT Frequency: Evaluated 6/21. PT at [facility] Eval and Treat, a minimum of 5 days per week OT Frequency: Evaluated 6/22. OT at SNF Eval and Treat, a minimum of 5 days per week            Contractures Contractures Info: Not present    Additional Factors Info  Code Status, Allergies, Insulin Sliding Scale Code Status Info: Full Allergies Info: No known allergies   Insulin Sliding Scale Info: 0-6 Units three times per day with meals       Current Medications (10/31/2019):  This is the current hospital active medication list Current Facility-Administered Medications  Medication Dose Route Frequency Provider Last Rate Last Admin  . enoxaparin (LOVENOX) injection 40 mg  40 mg Subcutaneous Daily Fair, 11/02/2019, MD   40 mg at 10/31/19 1022  . hydrALAZINE (APRESOLINE) injection 5 mg  5 mg Intravenous Q6H PRN 11/02/19, MD   5 mg at 10/29/19 1218  . insulin aspart (novoLOG) injection 0-6 Units  0-6 Units Subcutaneous TID WC Pokhrel, Laxman, MD   1 Units at 10/30/19 1647  . pantoprazole (PROTONIX) EC tablet 40 mg  40 mg Oral  Daily Pokhrel, Laxman, MD   40 mg at 10/31/19 1022     Discharge Medications: Please see discharge summary for a list of discharge medications.  Relevant Imaging Results:  Relevant Lab Results:   Additional Information [phone]  Sable Feil, LCSW

## 2019-10-31 NOTE — Plan of Care (Signed)
  Problem: Education: Goal: Knowledge of General Education information will improve Description Including pain rating scale, medication(s)/side effects and non-pharmacologic comfort measures Outcome: Progressing   

## 2019-10-31 NOTE — Progress Notes (Signed)
Inpatient Diabetes Program Recommendations  AACE/ADA: New Consensus Statement on Inpatient Glycemic Control (2015)  Target Ranges:  Prepandial:   less than 140 mg/dL      Peak postprandial:   less than 180 mg/dL (1-2 hours)      Critically ill patients:  140 - 180 mg/dL   Lab Results  Component Value Date   GLUCAP 216 (H) 10/31/2019   HGBA1C 6.3 (H) 10/30/2019    Review of Glycemic Control Results for Carolyn Bishop, Carolyn Bishop (MRN [id number]) as of 10/31/2019 15:49  Ref. Range 10/30/2019 11:28 10/30/2019 16:13 10/30/2019 21:23 10/31/2019 06:38  Glucose-Capillary Latest Ref Range: 70 - 99 mg/dL 734 (H) 193 (H) 790 (H) 127 (H)   Diabetes history:  DM2  Outpatient Diabetes medications:  None  Current orders for Inpatient glycemic control:  Novolog 0-6 units tid  Note: Spoke with patient at bedside to ask about home DM meds as Glipizide was a questionable home med.  She states she stopped taking Glipizide 1 year ago and does not take any oral DM medications.  She has changed her diet and current A1C is 6.3%.  She does not drink anything with regular sugar and watches her CHO intake.    Will continue to follow while inpatient.  Thank you, Dulce Sellar, RN, BSN Diabetes Coordinator Inpatient Diabetes Program [phone] (team pager from 8a-5p)

## 2019-10-31 NOTE — Progress Notes (Signed)
Occupational Therapy Treatment Patient Details Name: Carolyn Bishop MRN: [id number] DOB: 09/21/1932 Today's Date: 10/31/2019    History of present illness Pt is an 86 y/o female admitted secondary to increased confusion. Found to have acute encephalopathy secondary to UTI. PMH includes DM, HTN, and CHF.    OT comments  Pt progressing well with OT goals and agreeable to participate in acute therapy services. Guided pt in sit to stand trials at Mod A x 2 with Antony Salmon and Max A x 2 with RW with multimodal cues needed for correcting and maintaining optimal posture with improved carryover this session. Pt demonstrated ability to statically stand with RW with decreased physical assistance, but continues to be a high fall risk due to decreased balance and tendency for feet sliding forward. While in [place], guided pt in grooming tasks at sink with Min A and pt demonstrating improved ability to maintain standing position for > 5 minutes before seated rest break required. Based on physical assistance needed for transfers, decreased balance and strength, continue to recommend SNF for short term rehab prior to return home. Will continue to follow acutely.    Follow Up Recommendations  SNF;Supervision/Assistance - 24 hour    Equipment Recommendations  3 in 1 bedside commode    Recommendations for Other Services      Precautions / Restrictions Precautions Precautions: Fall       Mobility Bed Mobility               General bed mobility comments: up in recliner on entry  Transfers Overall transfer level: Needs assistance Equipment used: Rolling walker (2 wheeled) Transfers: Sit to/from Stand Sit to Stand: Max assist;+2 physical assistance         General transfer comment: Mod A + 2 for STS from recliner with Stedy. Once in [place], pt able to demo ability to pull self to upright posture mid-stance with min guard. Trialed STS with RW from recliner chair at end of session with pt requiring Max A  + 2 with assistance needed to block feet due to slipping    Balance Overall balance assessment: Needs assistance Sitting-balance support: No upper extremity supported;Feet supported Sitting balance-Leahy Scale: Fair     Standing balance support: Bilateral upper extremity supported;During functional activity;Single extremity supported Standing balance-Leahy Scale: Poor Standing balance comment: Pt unable to maintain standing balance without at least one handed support on DME. Min A for maintaining balance with one handed support, progressing to min guard with B UE support                           ADL either performed or assessed with clinical judgement   ADL Overall ADL's : Needs assistance/impaired     Grooming: Minimal assistance;Standing;Wash/dry face;Brushing hair Grooming Details (indicate cue type and reason): Overall Min A for washing face and brushing hair while standing in stedy. Pt required one seated rest break due to fatigue and demo some difficulty balancing in stedy with one handed support         Upper Body Dressing : Minimal assistance;Sitting Upper Body Dressing Details (indicate cue type and reason): Min A to don hospital gown around back seated in recliner chair                    General ADL Comments: Pt with improving ability to correct posture during session after initial frequent cueing. Pt continues to have deficits in strength, balance, endurance, and awareness  of deficits      Vision       Perception     Praxis      Cognition Arousal/Alertness: Awake/alert Behavior During Therapy: WFL for tasks assessed/performed;Anxious Overall Cognitive Status: No family/caregiver present to determine baseline cognitive functioning                                 General Comments: Pt oriented to person, place, and time. disoriented to situation and physical deficits that impact safety        Exercises     Shoulder  Instructions       General Comments Pt reporting desire to return home. Time spent educating on CLOF vs PLOF and decreased physical support that can be provided at home. Provided general education on SNF rehab vs [facility] rehab    Pertinent Vitals/ Pain       Pain Assessment: No/denies pain  Home Living                                          Prior Functioning/Environment              Frequency  Min 2X/week        Progress Toward Goals  OT Goals(current goals can now be found in the care plan section)  Progress towards OT goals: Progressing toward goals  Acute Rehab OT Goals Patient Stated Goal: go home OT Goal Formulation: With patient Time For Goal Achievement: 11/13/19 Potential to Achieve Goals: Good ADL Goals Pt Will Perform Grooming: with min assist;standing Pt Will Perform Lower Body Bathing: with min assist;sit to/from stand;sitting/lateral leans Pt Will Perform Lower Body Dressing: with mod assist;sit to/from stand;sitting/lateral leans Pt Will Transfer to Toilet: with mod assist;stand pivot transfer;bedside commode Pt Will Perform Toileting - Clothing Manipulation and hygiene: with min assist;sitting/lateral leans;sit to/from stand  Plan Discharge plan remains appropriate    Co-evaluation    PT/OT/SLP Co-Evaluation/Treatment: Yes Reason for Co-Treatment: Complexity of the patient's impairments (multi-system involvement);To address functional/ADL transfers   OT goals addressed during session: ADL's and self-care      AM-PAC OT "6 Clicks" Daily Activity     Outcome Measure   Help from another person eating meals?: A Little Help from another person taking care of personal grooming?: A Little Help from another person toileting, which includes using toliet, bedpan, or urinal?: A Lot Help from another person bathing (including washing, rinsing, drying)?: A Lot Help from another person to put on and taking off regular upper body clothing?: A  Little Help from another person to put on and taking off regular lower body clothing?: A Lot 6 Click Score: 15    End of Session Equipment Utilized During Treatment: Gait belt;Rolling walker  OT Visit Diagnosis: Unsteadiness on feet (R26.81);Muscle weakness (generalized) (M62.81);History of falling (Z91.81)   Activity Tolerance Patient tolerated treatment well   Patient Left in chair;with call bell/phone within reach;with chair alarm set   Nurse Communication Mobility status;Other (comment) (purewick placement)        Time: 4034-7425 OT Time Calculation (min): 37 min  Charges: OT General Charges $OT Visit: 1 Visit OT Treatments $Self Care/Home Management : 8-22 mins  Layla Maw, OTR/L   Layla Maw 10/31/2019, 2:43 PM

## 2019-10-31 NOTE — Progress Notes (Signed)
PROGRESS NOTE  Carolyn Bishop JXB:[id number] DOB: March 28, 1933 DOA: 10/28/2019 PCP: Andi Devon, MD   LOS: 3 days   Brief narrative: As per HPI,  Carolyn Bishop is a 86 y.o. female with past medical history of of DM type II and HTN was brought to ED by EMS for confusion and admitted to the hospital for acute encephalopathy presumed secondary to UTI. Admitting physician was unable to reach the family. Patient was reportedly brought in by EMS from home for confusion that started this morning. Family dropped her off and said to call with questions. In the ED: Labile pressures otherwise stable on room air; afebrile. Labs remarkable for WBC 5.3, Hgb 11.1, glucose 148 otherwise CMP WNL, Lactate normal limits, UA with leuks/nitrites. Blood cultures were drawn. CT Head was non-acute. Patient given 500 mL IVF and Rocephin and called for admission for altered mental status presumed secondary to UTI.  Assessment/Plan:  Principal Problem:   Acute encephalopathy Active Problems:   Diabetes mellitus without complication (HCC)   Essential hypertension   Acute cystitis   Grade I diastolic dysfunction  Acute metabolic encephalopathy secondary to Acute Cystitis Resolved.  Likely at her baseline at this time.  Completed rocephin IV   Chronic diastolic congestive heart failure.   2D echocardiogram reviewed from June 2019 showed grade I diastolic dysfunction.  Compensated at this time.  Compensated at this time.  Essential HTN Slightly elevated today.  No medications in the med rec.  Will monitor.  Spoke with the patient's niece who stated that she does not really take any medication.  Consider amlodipine on discharge.  Not had a follow-up with PCP in a long time.   History of Diabetes mellitus type II.  per med rec no medications.  Hemoglobin A1c of 6.3, will consider dietary modification on discharge.  Debility, generalized weakness/deconditioning.  TSH within normal limits..  Physical therapy  recommended skilled nursing facility on discharge.  DVT prophylaxis: enoxaparin (LOVENOX) injection 40 mg Start: 10/29/19 1000  Code Status: Full code  Family Communication: Unable to reach the patient sisters on the phone again today but was able to speak with the patient's niece Ms Daphine Deutscher who indicated  skilled nursing facility placement.  Patient has elderly sister who is unable to take care of her at home.  Status is: Inpatient  Remains inpatient appropriate because:Unsafe d/c plan, physical therapy recommended skilled nursing facility placement at this time.     Dispo: The patient is from: Home              Anticipated d/c is to:  skilled nursing facility placement.  Transition of care care consulted.              Anticipated d/c date is: 1-2 days              Patient currently is medically stable to d/c.   Consultants:  None  Procedures:  None  Antibiotics:  . Rocephin IV> 10/28/2019>6/23   Subjective: Patient was seen and examined at bedside.  Patient denies any fever, chills, rigors.  Alert awake oriented.  Has generalized weakness.    Objective: Vitals:   10/31/19 0540 10/31/19 0800  BP: (!) 178/70 (!) 160/73  Pulse: 73 79  Resp: 16 16  Temp: 98.8 F (37.1 C) 98 F (36.7 C)  SpO2: 98% 99%    Intake/Output Summary (Last 24 hours) at 10/31/2019 1250 Last data filed at 10/31/2019 1133 Gross per 24 hour  Intake 760 ml  Output 0 ml  Net 760 ml   Filed Weights   10/28/19 1933 10/30/19 0952  Weight: 70 kg 63.5 kg   Body mass index is 24.03 kg/m.   Physical Exam: GENERAL: Patient is alert awake and oriented. Not in obvious distress.  Elderly female, HENT: No scleral pallor or icterus. Pupils equally reactive to light. Oral mucosa is moist NECK: is supple, no gross swelling noted. CHEST: Clear to auscultation. No crackles or wheezes.  Diminished breath sounds bilaterally. CVS: S1 and S2 heard, no murmur. Regular rate and rhythm.  ABDOMEN: Soft,  non-tender, bowel sounds are present. EXTREMITIES: No edema. CNS: Cranial nerves are intact.  Moving all extremities. SKIN: warm and dry without rashes.  Data Review: I have personally reviewed the following laboratory data and studies,  CBC: Recent Labs  Lab 10/28/19 1941 10/29/19 0447 10/30/19 0615  WBC 5.3 4.3 4.6  NEUTROABS 3.8  --   --   HGB 11.1* 11.5* 10.8*  HCT 34.8* 36.4 33.1*  MCV 94.3 94.5 91.7  PLT 197 185 168   Basic Metabolic Panel: Recent Labs  Lab 10/28/19 1941 10/29/19 0447 10/30/19 0615  NA 142  --  142  K 4.0  --  3.8  CL 107  --  109  CO2 26  --  23  GLUCOSE 148*  --  76  BUN 31*  --  25*  CREATININE 0.95  --  0.98  CALCIUM 10.0  --  9.2  MG  --  1.8 1.7  PHOS  --  3.5  --    Liver Function Tests: Recent Labs  Lab 10/28/19 1941  AST 27  ALT 21  ALKPHOS 65  BILITOT 0.7  PROT 7.1  ALBUMIN 3.6   No results for input(s): LIPASE, AMYLASE in the last 168 hours. No results for input(s): AMMONIA in the last 168 hours. Cardiac Enzymes: No results for input(s): CKTOTAL, CKMB, CKMBINDEX, TROPONINI in the last 168 hours. BNP (last 3 results) No results for input(s): BNP in the last 8760 hours.  ProBNP (last 3 results) No results for input(s): PROBNP in the last 8760 hours.  CBG: Recent Labs  Lab 10/30/19 1128 10/30/19 1613 10/30/19 2123 10/31/19 0638 10/31/19 1106  GLUCAP 158* 153* 336* 127* 216*   Recent Results (from the past 240 hour(s))  Culture, blood (routine x 2)     Status: None (Preliminary result)   Collection Time: 10/28/19  9:58 PM   Specimen: BLOOD  Result Value Ref Range Status   Specimen Description BLOOD RIGHT HAND  Final   Special Requests   Final    BOTTLES DRAWN AEROBIC AND ANAEROBIC Blood Culture results may not be optimal due to an inadequate volume of blood received in culture bottles   Culture   Final    NO GROWTH 3 DAYS Performed at [facility], [address], [place], [place] [address]    Report  Status PENDING  Incomplete  Urine culture     Status: Abnormal   Collection Time: 10/28/19 10:02 PM   Specimen: Urine, Catheterized  Result Value Ref Range Status   Specimen Description URINE, CATHETERIZED  Final   Special Requests   Final    NONE Performed at [facility], [address] [place], [place] [address]    Culture >=100,000 COLONIES/mL ESCHERICHIA COLI (A)  Final   Report Status 10/31/2019 FINAL  Final   Organism ID, Bacteria ESCHERICHIA COLI (A)  Final      Susceptibility   Escherichia coli -  MIC*    AMPICILLIN <=2 SENSITIVE Sensitive     CEFAZOLIN <=4 SENSITIVE Sensitive     CEFTRIAXONE <=0.25 SENSITIVE Sensitive     CIPROFLOXACIN <=0.25 SENSITIVE Sensitive     GENTAMICIN <=1 SENSITIVE Sensitive     IMIPENEM 0.5 SENSITIVE Sensitive     NITROFURANTOIN <=16 SENSITIVE Sensitive     TRIMETH/SULFA >=320 RESISTANT Resistant     AMPICILLIN/SULBACTAM <=2 SENSITIVE Sensitive     PIP/TAZO <=4 SENSITIVE Sensitive     * >=100,000 COLONIES/mL ESCHERICHIA COLI  SARS Coronavirus 2 by RT PCR (hospital order, performed in [place] hospital lab) Nasopharyngeal Nasopharyngeal Swab     Status: None   Collection Time: 10/28/19 10:33 PM   Specimen: Nasopharyngeal Swab  Result Value Ref Range Status   SARS Coronavirus 2 NEGATIVE NEGATIVE Final    Comment: (NOTE) SARS-CoV-2 target nucleic acids are NOT DETECTED.  The SARS-CoV-2 RNA is generally detectable in upper and lower respiratory specimens during the acute phase of infection. The lowest concentration of SARS-CoV-2 viral copies this assay can detect is 250 copies / mL. A negative result does not preclude SARS-CoV-2 infection and should not be used as the sole basis for treatment or other patient management decisions.  A negative result may occur with improper specimen collection / handling, submission of specimen other than nasopharyngeal swab, presence of viral mutation(s) within the areas targeted by this assay, and  inadequate number of viral copies (<250 copies / mL). A negative result must be combined with clinical observations, patient history, and epidemiological information.  Fact Sheet for Patients:   StrictlyIdeas.no  Fact Sheet for Healthcare Providers: BankingDealers.co.za  This test is not yet approved or  cleared by the [place] FDA and has been authorized for detection and/or diagnosis of SARS-CoV-2 by FDA under an Emergency Use Authorization (EUA).  This EUA will remain in effect (meaning this test can be used) for the duration of the COVID-19 declaration under Section 564(b)(1) of the Act, 21 U.S.C. section 360bbb-3(b)(1), unless the authorization is terminated or revoked sooner.  Performed at [facility], [place] [address] [place], [place] [address]   Culture, blood (routine x 2)     Status: None (Preliminary result)   Collection Time: 10/28/19 10:51 PM   Specimen: BLOOD  Result Value Ref Range Status   Specimen Description BLOOD LEFT FOREARM  Final   Special Requests   Final    BOTTLES DRAWN AEROBIC AND ANAEROBIC Blood Culture adequate volume   Culture   Final    NO GROWTH 3 DAYS Performed at [facility], [place] [address] [place], [place] [address]    Report Status PENDING  Incomplete     Studies: No results found.    Flora Lipps, MD  [facility] 10/31/2019

## 2019-10-31 NOTE — TOC Progression Note (Signed)
Transition of Care [facility]) - Progression Note    Patient Details  Name: Carolyn Bishop MRN: [id number] Date of Birth: 1932/06/13  Transition of [facility] CM/SW Contact  Okey Dupre Lazaro Arms, LCSW Phone Number: 10/31/2019, 6:21 PM  Clinical Narrative: CSW talked with patient's niece Madison Hickman [phone]) today regarding SNF placement and facility responses given. [name] and call made to admissions director Unknown Foley regarding patient and they can take her pending insurance authorization. Clinicals faxed to [facility]. CSW will continue to follow and provide SW interventions services through discharge to SNF.       Expected Discharge Plan: Home w Home Health Services Barriers to Discharge: Other (comment) (Discharge plan not determined at this time)  Expected Discharge Plan and Services Expected Discharge Plan: Home w Home Health Services In-house Referral: Clinical Social Work     Living arrangements for the past 2 months: Single Family Home (patient lives with her sisters)                                     Social Determinants of Health (SDOH) Interventions  No SDOH interventions requested or needed at this time.  Readmission Risk Interventions No flowsheet data found.

## 2019-10-31 NOTE — Progress Notes (Signed)
Physical Therapy Treatment Patient Details Name: Carolyn Bishop MRN: [id number] DOB: Sep 23, 1932 Today's Date: 10/31/2019    History of Present Illness Pt is an 86 y/o female admitted secondary to increased confusion. Found to have acute encephalopathy secondary to UTI. PMH includes DM, HTN, and CHF.     PT Comments    Continuing work on functional mobility and activity tolerance;  Guided pt in sit to stand trials at Mod A x 2 with Carolyn Bishop and Max A x 2 with RW with multimodal cues needed for correcting and maintaining optimal posture with improved carryover this session. Pt demonstrated ability to statically stand with RW with decreased physical assistance, but continues to be a high fall risk due to decreased balance and tendency for feet sliding forward. While in [place], guided pt in grooming tasks at sink with Min A and pt demonstrating improved ability to maintain standing position for > 5 minutes before seated rest break required. Based on physical assistance needed for transfers, decreased balance and strength, continue to recommend SNF for short term rehab prior to return home.   Follow Up Recommendations  SNF     Equipment Recommendations  Rolling walker with 5" wheels;3in1 (PT)    Recommendations for Other Services       Precautions / Restrictions Precautions Precautions: Fall    Mobility  Bed Mobility               General bed mobility comments: up in recliner on entry  Transfers Overall transfer level: Needs assistance Equipment used: Rolling walker (2 wheeled) Transfers: Sit to/from Stand Sit to Stand: Max assist;+2 physical assistance         General transfer comment: Mod A + 2 for STS from recliner with [place]. Once in [name], pt able to demo ability to pull self to upright posture mid-stance with min guard. Trialed STS with RW from recliner chair at end of session with pt requiring Max A + 2 with assistance needed to block feet due to  slipping  Ambulation/Gait                 Stairs             Wheelchair Mobility    Modified Rankin (Stroke Patients Only)       Balance Overall balance assessment: Needs assistance Sitting-balance support: No upper extremity supported;Feet supported Sitting balance-Leahy Scale: Fair     Standing balance support: Bilateral upper extremity supported;During functional activity;Single extremity supported Standing balance-Leahy Scale: Poor Standing balance comment: Pt unable to maintain standing balance without at least one handed support on DME. Min A for maintaining balance with one handed support, progressing to min guard with B UE support                            Cognition Arousal/Alertness: Awake/alert Behavior During Therapy: WFL for tasks assessed/performed;Anxious Overall Cognitive Status: No family/caregiver present to determine baseline cognitive functioning                                 General Comments: Pt oriented to person, place, and time. disoriented to situation and physical deficits that impact safety      Exercises      General Comments General comments (skin integrity, edema, etc.): Pt reporting desire to return home. Time spent educating on CLOF vs PLOF and decreased physical support that can be provided  at home. Provided general education on SNF rehab vs [facility] rehab      Pertinent Vitals/Pain Pain Assessment: No/denies pain    Home Living                      Prior Function            PT Goals (current goals can now be found in the care plan section) Acute Rehab PT Goals Patient Stated Goal: go home PT Goal Formulation: With patient Time For Goal Achievement: 11/12/19 Potential to Achieve Goals: Good Progress towards PT goals: Progressing toward goals    Frequency    Min 2X/week      PT Plan Current plan remains appropriate    Co-evaluation PT/OT/SLP Co-Evaluation/Treatment:  Yes Reason for Co-Treatment: To address functional/ADL transfers;For patient/therapist safety PT goals addressed during session: Mobility/safety with mobility OT goals addressed during session: ADL's and self-care      AM-PAC PT "6 Clicks" Mobility   Outcome Measure  Help needed turning from your back to your side while in a flat bed without using bedrails?: A Little Help needed moving from lying on your back to sitting on the side of a flat bed without using bedrails?: A Little Help needed moving to and from a bed to a chair (including a wheelchair)?: A Lot Help needed standing up from a chair using your arms (e.g., wheelchair or bedside chair)?: A Lot Help needed to walk in hospital room?: A Lot Help needed climbing 3-5 steps with a railing? : Total 6 Click Score: 13    End of Session Equipment Utilized During Treatment: Gait belt Activity Tolerance: Patient tolerated treatment well Patient left: in chair;with call bell/phone within reach Nurse Communication: Mobility status PT Visit Diagnosis: Unsteadiness on feet (R26.81);Muscle weakness (generalized) (M62.81);Difficulty in walking, not elsewhere classified (R26.2)     Time: 1355-1430 PT Time Calculation (min) (ACUTE ONLY): 35 min  Charges:  $Therapeutic Activity: 8-22 mins                     Carolyn Bishop, PT  Acute Rehabilitation Services Pager [phone] Office [phone]    Carolyn Bishop 10/31/2019, 4:32 PM

## 2019-11-01 LAB — BASIC METABOLIC PANEL
Anion gap: 8 (ref 5–15)
BUN: 20 mg/dL (ref 8–23)
CO2: 26 mmol/L (ref 22–32)
Calcium: 9.2 mg/dL (ref 8.9–10.3)
Chloride: 106 mmol/L (ref 98–111)
Creatinine, Ser: 1.04 mg/dL — ABNORMAL HIGH (ref 0.44–1.00)
GFR calc Af Amer: 57 mL/min — ABNORMAL LOW (ref >60)
GFR calc non Af Amer: 49 mL/min — ABNORMAL LOW (ref >60)
Glucose, Bld: 149 mg/dL — ABNORMAL HIGH (ref 70–99)
Potassium: 4.4 mmol/L (ref 3.5–5.1)
Sodium: 140 mmol/L (ref 135–145)

## 2019-11-01 LAB — CBC
HCT: 35.1 % — ABNORMAL LOW (ref 36.0–46.0)
Hemoglobin: 11.6 g/dL — ABNORMAL LOW (ref 12.0–15.0)
MCH: 30.6 pg (ref 26.0–34.0)
MCHC: 33 g/dL (ref 30.0–36.0)
MCV: 92.6 fL (ref 80.0–100.0)
Platelets: 193 10*3/uL (ref 150–400)
RBC: 3.79 MIL/uL — ABNORMAL LOW (ref 3.87–5.11)
RDW: 12.2 % (ref 11.5–15.5)
WBC: 4.5 10*3/uL (ref 4.0–10.5)
nRBC: 0 % (ref 0.0–0.2)

## 2019-11-01 LAB — GLUCOSE, CAPILLARY
Glucose-Capillary: 108 mg/dL — ABNORMAL HIGH (ref 70–99)
Glucose-Capillary: 159 mg/dL — ABNORMAL HIGH (ref 70–99)
Glucose-Capillary: 185 mg/dL — ABNORMAL HIGH (ref 70–99)
Glucose-Capillary: 158 mg/dL — ABNORMAL HIGH (ref 70–99)

## 2019-11-01 LAB — SARS CORONAVIRUS 2 BY RT PCR (HOSPITAL ORDER, PERFORMED IN ~~LOC~~ HOSPITAL LAB): SARS Coronavirus 2: NEGATIVE

## 2019-11-01 MED ORDER — AMLODIPINE BESYLATE 5 MG PO TABS
5.0000 mg | ORAL_TABLET | Freq: Every day | ORAL | 1 refills | Status: DC
Start: 2019-11-01 — End: 2020-02-05

## 2019-11-01 NOTE — Plan of Care (Signed)
  Problem: Activity: Goal: Risk for activity intolerance will decrease Outcome: Progressing   

## 2019-11-01 NOTE — Discharge Summary (Signed)
Physician Discharge Summary  Carolyn Bishop XBL:[id number] DOB: [birth year] DOA: 10/28/2019  PCP: Andi Devon, MD  Admit date: 10/28/2019 Discharge date: 11/01/2019  Admitted From: Home  Discharge disposition: Skilled nursing facility   Recommendations for Outpatient Follow-Up:   . Follow up with your primary care provider at the skilled nursing facility in 3 to 5 days . Check CBC, BMP next visit . Please adjust antihypertensives for blood pressure.  She was started on low-dose amlodipine while in the hospital.   Discharge Diagnosis:   Principal Problem:   Acute encephalopathy Active Problems:   Diabetes mellitus without complication (HCC)   Essential hypertension   Acute E. coli cystitis   Grade I diastolic dysfunction   Discharge Condition: Improved.  Diet recommendation: .  Carbohydrate-modified.    Wound care: None.  Code status: Full.   History of Present Illness:   Carolyn Terryis a 86 y.o.femalewith past medical history of ofDM type II and HTN was brought to ED by EMS for confusion and admitted to the hospital for acute encephalopathy presumed secondary to UTI.Admitting physician was unable to reach the family. Patient was reportedly brought in by EMS from home for confusion that started this morning. Family had dropped her off and said to call with questions. In the RA:QTMAUQ pressures otherwise stable on room air; afebrile. Labs remarkable forWBC 5.3, Hgb 11.1, glucose 148 otherwise CMP WNL, Lactate normal limits, UA with leuks/nitrites. Blood cultures were drawn. CT Head was non-acute. Patient given 500 mL IVF and Rocephin and called for admission for altered mental status presumed secondary to UTI.   Hospital Course:   Following conditions were addressed during hospitalization as listed below,  Acute metabolic encephalopathy secondary to Acute Cystitis Resolved.  Likely at her baseline at this time.  Completed rocephin IV  Grade 1 diastolic  dysfunction.  Congestive heart failure has been ruled out.   2D echocardiogram reviewed from June 2019 showed grade I diastolic dysfunction.  Compensated at this time.    Amlodipine has been started for blood pressure control.  Essential HTN Patient does have history of hypertension but was not taking any medication.  Has been started on low-dose amlodipine while in hospital.  Might need to be adjusted as outpatient..  History of Diabetes mellitus type II.  per med rec no medications.    Likely diet controlled.  Hemoglobin A1c of 6.3, will consider dietary modification on discharge.    Debility, generalized weakness/deconditioning.  TSH within normal limits..  Physical therapy recommended skilled nursing facility on discharge.  E. coli UTI.  Received IV Rocephin.  Completed course.  Disposition.  At this time, patient is stable for disposition to skilled nursing facility.  Spoke with the patient's niece yesterday about plan for disposition.  Medical Consultants:    None.  Procedures:    None Subjective:   Today, patient denies any dizziness, lightheadedness but has generalized weakness.  No fever chills or rigor.  Discharge Exam:   Vitals:   11/01/19 0852 11/01/19 1138  BP: (!) 153/63 (!) 148/59  Pulse: 75 69  Resp: 16   Temp: 98.4 F (36.9 C)   SpO2: 99%    Vitals:   10/31/19 2100 11/01/19 0606 11/01/19 0852 11/01/19 1138  BP:  (!) 156/62 (!) 153/63 (!) 148/59  Pulse:  65 75 69  Resp:  15 16   Temp:  98.3 F (36.8 C) 98.4 F (36.9 C)   TempSrc:  Oral Oral   SpO2:  100% 99%   Weight: 63.1  kg     Height:        General: Alert awake, not in obvious distress, communicative HENT: pupils equally reacting to light,  No scleral pallor or icterus noted. Oral mucosa is moist.  Chest:  Clear breath sounds.  Diminished breath sounds bilaterally. No crackles or wheezes.  CVS: S1 &S2 heard. No murmur.  Regular rate and rhythm. Abdomen: Soft, nontender, nondistended.   Bowel sounds are heard.   Extremities: No cyanosis, clubbing or edema.  Peripheral pulses are palpable. Psych: Alert, awake and communicative normal mood CNS:  No cranial nerve deficits.  Power equal in all extremities.   Skin: Warm and dry.  No rashes noted.  The results of significant diagnostics from this hospitalization (including imaging, microbiology, ancillary and laboratory) are listed below for reference.     Diagnostic Studies:   CT Head Wo Contrast  Result Date: 10/28/2019 CLINICAL DATA:  Altered mental status. EXAM: CT HEAD WITHOUT CONTRAST TECHNIQUE: Contiguous axial images were obtained from the base of the skull through the vertex without intravenous contrast. COMPARISON:  July 10, 2004 FINDINGS: Brain: There is moderate severity cerebral atrophy with widening of the extra-axial spaces and ventricular dilatation. There are areas of decreased attenuation within the white matter tracts of the supratentorial brain, consistent with microvascular disease changes. Vascular: No hyperdense vessel or unexpected calcification. Skull: Normal. Negative for fracture or focal lesion. Sinuses/Orbits: There is marked severity sphenoid sinus mucosal thickening. Other: None. IMPRESSION: 1. Generalized cerebral atrophy. 2. No acute intracranial abnormality. Electronically Signed   By: Aram Candelahaddeus  Houston M.D.   On: 10/28/2019 22:31   DG Chest Port 1 View  Result Date: 10/28/2019 CLINICAL DATA:  Altered mental status. EXAM: PORTABLE CHEST 1 VIEW COMPARISON:  11/07/2017, lung bases from abdominal CT 02/21/2015. FINDINGS: Patient is leaning to the left. 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 This is stable in size from 2016. The cardiomediastinal contours are normal. Aortic atherosclerosis. Pulmonary vasculature is normal. No consolidation, pleural effusion, or pneumothorax. No acute osseous abnormalities are seen. IMPRESSION: No acute abnormality.  Electronically Signed   By: Narda RutherfordMelanie  Sanford M.D.   On: 10/28/2019 22:16     Labs:   Basic Metabolic Panel: Recent Labs  Lab 10/28/19 1941 10/28/19 1941 10/29/19 0447 10/30/19 0615 11/01/19 0322  NA 142  --   --  142 140  K 4.0   < >  --  3.8 4.4  CL 107  --   --  109 106  CO2 26  --   --  23 26  GLUCOSE 148*  --   --  76 149*  BUN 31*  --   --  25* 20  CREATININE 0.95  --   --  0.98 1.04*  CALCIUM 10.0  --   --  9.2 9.2  MG  --   --  1.8 1.7  --   PHOS  --   --  3.5  --   --    < > = values in this interval not displayed.   GFR Estimated Creatinine Clearance: 34.2 mL/min (A) (by C-G formula based on SCr of 1.04 mg/dL (H)). Liver Function Tests: Recent Labs  Lab 10/28/19 1941  AST 27  ALT 21  ALKPHOS 65  BILITOT 0.7  PROT 7.1  ALBUMIN 3.6   No results for input(s): LIPASE, AMYLASE in the last 168 hours. No results for input(s): AMMONIA in the last 168 hours. Coagulation profile No results  for input(s): INR, PROTIME in the last 168 hours.  CBC: Recent Labs  Lab 10/28/19 1941 10/29/19 0447 10/30/19 0615 11/01/19 0322  WBC 5.3 4.3 4.6 4.5  NEUTROABS 3.8  --   --   --   HGB 11.1* 11.5* 10.8* 11.6*  HCT 34.8* 36.4 33.1* 35.1*  MCV 94.3 94.5 91.7 92.6  PLT 197 185 168 193   Cardiac Enzymes: No results for input(s): CKTOTAL, CKMB, CKMBINDEX, TROPONINI in the last 168 hours. BNP: Invalid input(s): POCBNP CBG: Recent Labs  Lab 10/31/19 1106 10/31/19 1616 10/31/19 2059 11/01/19 0632 11/01/19 1114  GLUCAP 216* 136* 191* 108* 159*   D-Dimer No results for input(s): DDIMER in the last 72 hours. Hgb A1c Recent Labs    10/30/19 1238  HGBA1C 6.3*   Lipid Profile No results for input(s): CHOL, HDL, LDLCALC, TRIG, CHOLHDL, LDLDIRECT in the last 72 hours. Thyroid function studies Recent Labs    10/30/19 1238  TSH 0.949   Anemia work up Recent Labs    10/30/19 1238  VITAMINB12 237   Microbiology Recent Results (from the past 240 hour(s))   Culture, blood (routine x 2)     Status: None (Preliminary result)   Collection Time: 10/28/19  9:58 PM   Specimen: BLOOD  Result Value Ref Range Status   Specimen Description BLOOD RIGHT HAND  Final   Special Requests   Final    BOTTLES DRAWN AEROBIC AND ANAEROBIC Blood Culture results may not be optimal due to an inadequate volume of blood received in culture bottles   Culture   Final    NO GROWTH 4 DAYS Performed at [facility], [address] [place], [place] [address]    Report Status PENDING  Incomplete  Urine culture     Status: Abnormal   Collection Time: 10/28/19 10:02 PM   Specimen: Urine, Catheterized  Result Value Ref Range Status   Specimen Description URINE, CATHETERIZED  Final   Special Requests   Final    NONE Performed at [facility], [address] [place], [place] [address]    Culture >=100,000 COLONIES/mL ESCHERICHIA COLI (A)  Final   Report Status 10/31/2019 FINAL  Final   Organism ID, Bacteria ESCHERICHIA COLI (A)  Final      Susceptibility   Escherichia coli - MIC*    AMPICILLIN <=2 SENSITIVE Sensitive     CEFAZOLIN <=4 SENSITIVE Sensitive     CEFTRIAXONE <=0.25 SENSITIVE Sensitive     CIPROFLOXACIN <=0.25 SENSITIVE Sensitive     GENTAMICIN <=1 SENSITIVE Sensitive     IMIPENEM 0.5 SENSITIVE Sensitive     NITROFURANTOIN <=16 SENSITIVE Sensitive     TRIMETH/SULFA >=320 RESISTANT Resistant     AMPICILLIN/SULBACTAM <=2 SENSITIVE Sensitive     PIP/TAZO <=4 SENSITIVE Sensitive     * >=100,000 COLONIES/mL ESCHERICHIA COLI  SARS Coronavirus 2 by RT PCR (hospital order, performed in [facility] hospital lab) Nasopharyngeal Nasopharyngeal Swab     Status: None   Collection Time: 10/28/19 10:33 PM   Specimen: Nasopharyngeal Swab  Result Value Ref Range Status   SARS Coronavirus 2 NEGATIVE NEGATIVE Final    Comment: (NOTE) SARS-CoV-2 target nucleic acids are NOT DETECTED.  The SARS-CoV-2 RNA is generally detectable in upper and  lower respiratory specimens during the acute phase of infection. The lowest concentration of SARS-CoV-2 viral copies this assay can detect is 250 copies / mL. A negative result does not preclude SARS-CoV-2 infection and should not be used as the  sole basis for treatment or other patient management decisions.  A negative result may occur with improper specimen collection / handling, submission of specimen other than nasopharyngeal swab, presence of viral mutation(s) within the areas targeted by this assay, and inadequate number of viral copies (<250 copies / mL). A negative result must be combined with clinical observations, patient history, and epidemiological information.  Fact Sheet for Patients:   BoilerBrush.com.cy  Fact Sheet for Healthcare Providers: https://pope.com/  This test is not yet approved or  cleared by the [place] FDA and has been authorized for detection and/or diagnosis of SARS-CoV-2 by FDA under an Emergency Use Authorization (EUA).  This EUA will remain in effect (meaning this test can be used) for the duration of the COVID-19 declaration under Section 564(b)(1) of the Act, 21 U.S.C. section 360bbb-3(b)(1), unless the authorization is terminated or revoked sooner.  Performed at [facility], [address], [place], [place] [address]   Culture, blood (routine x 2)     Status: None (Preliminary result)   Collection Time: 10/28/19 10:51 PM   Specimen: BLOOD  Result Value Ref Range Status   Specimen Description BLOOD LEFT FOREARM  Final   Special Requests   Final    BOTTLES DRAWN AEROBIC AND ANAEROBIC Blood Culture adequate volume   Culture   Final    NO GROWTH 4 DAYS Performed at [facility], [address] [place], [place] [address]    Report Status PENDING  Incomplete  SARS Coronavirus 2 by RT PCR (hospital order, performed in [facility] hospital lab) Nasopharyngeal Nasopharyngeal Swab      Status: None   Collection Time: 11/01/19 11:15 AM   Specimen: Nasopharyngeal Swab  Result Value Ref Range Status   SARS Coronavirus 2 NEGATIVE NEGATIVE Final    Comment: (NOTE) SARS-CoV-2 target nucleic acids are NOT DETECTED.  The SARS-CoV-2 RNA is generally detectable in upper and lower respiratory specimens during the acute phase of infection. The lowest concentration of SARS-CoV-2 viral copies this assay can detect is 250 copies / mL. A negative result does not preclude SARS-CoV-2 infection and should not be used as the sole basis for treatment or other patient management decisions.  A negative result may occur with improper specimen collection / handling, submission of specimen other than nasopharyngeal swab, presence of viral mutation(s) within the areas targeted by this assay, and inadequate number of viral copies (<250 copies / mL). A negative result must be combined with clinical observations, patient history, and epidemiological information.  Fact Sheet for Patients:   BoilerBrush.com.cy  Fact Sheet for Healthcare Providers: https://pope.com/  This test is not yet approved or  cleared by the [place] FDA and has been authorized for detection and/or diagnosis of SARS-CoV-2 by FDA under an Emergency Use Authorization (EUA).  This EUA will remain in effect (meaning this test can be used) for the duration of the COVID-19 declaration under Section 564(b)(1) of the Act, 21 U.S.C. section 360bbb-3(b)(1), unless the authorization is terminated or revoked sooner.  Performed at [facility], [address] [place], [place] [address]      Discharge Instructions:   Discharge Instructions    Diet Carb Modified   Complete by: As directed    Discharge instructions   Complete by: As directed    Follow up with primary care provider at the skilled nursing facility in 3 to 5 days. Stay on carbohydrate controlled diet.  Diabetes. You have been prescribed amlodipine low-dose for  blood pressure. Follow-up with your primary care provider as outpatient for closer monitoring of blood pressure and diabetes.   Increase activity slowly   Complete by: As directed      Allergies as of 11/01/2019   No Known Allergies     Medication List    STOP taking these medications   multivitamin with minerals Tabs tablet   pantoprazole 40 MG tablet Commonly known as: Protonix     TAKE these medications   amLODipine 5 MG tablet Commonly known as: NORVASC Take 1 tablet (5 mg total) by mouth daily.         Time coordinating discharge: 39 minutes  Signed:  Rayette Mogg  [facility] 11/01/2019, 1:47 PM

## 2019-11-01 NOTE — TOC Transition Note (Signed)
Transition of Care (TOC) - CM/SW Discharge Note *Discharged to [facility], transported by non-emergency ambulance Room number [address]    Patient Details  Name: Murle Hellstrom MRN: [id number] Date of Birth: 21-May-1932  Transition of Care [facility]) CM/SW Contact:  Cristobal Goldmann, LCSW Phone Number: 11/01/2019, 4:07 PM   Clinical Narrative:   Patient medically stable for discharge to [facility]. Received insurance authorization from [employer]: Reference ID [id number] - approved for 5 days eff. 6/24; Next review date 6/28; Case manager is Idelia Salm and fax number for clinicals - [phone]. This information was communicated to Unknown Foley, admissions director at [facility]. Niece, Madison Hickman contacted and informed of patient's readiness for discharge. Discharge clinicals transmitted to facility and non-emergency ambulance transport arranged.     Final next level of care: Skilled Nursing Facility [facility]) Barriers to Discharge: Barriers Resolved   Patient Goals and CMS Choice Patient states their goals for this hospitalization and ongoing recovery are:: Patient now agreeable to ST rehab CMS Medicare.gov Compare Post Acute Care list provided to:: [name] (must comment) (Daughter provided with Medicare.gov SNF list) Choice offered to / list presented to : Patient (Niece provided with copy of Medicare.gov SNF list)  Discharge Placement PASRR number recieved: 10/31/19            Patient chooses bed at: [facility] Patient to be transferred to facility by: Non-emergency Ambulance Name of family member notified: Niece Madison Hickman - [phone] Patient and family notified of of transfer: 11/01/19  Discharge Plan and Services In-house Referral: Clinical Social Work                                 Social Determinants of Health (SDOH) Interventions  No SDOH interventions requested or needed at discharge   Readmission Risk  Interventions No flowsheet data found.

## 2019-11-01 NOTE — Plan of Care (Signed)
Patient awaiting ambulance transport to [facility].

## 2019-11-02 ENCOUNTER — Encounter (HOSPITAL_COMMUNITY): Payer: Self-pay | Admitting: Family Medicine

## 2019-11-02 LAB — CULTURE, BLOOD (ROUTINE X 2)
Culture: NO GROWTH
Culture: NO GROWTH
Special Requests: ADEQUATE

## 2019-11-02 NOTE — Plan of Care (Signed)
Patient discharged to [facility] via stretcher with PTAR. All belongings sent with patient. VSS prior to discharge. Patient bathed and clean gown applied prior to leaving. Maple Tennyson notified of discharge. Family notified earlier of planned discharge.

## 2019-12-20 ENCOUNTER — Emergency Department (HOSPITAL_COMMUNITY): Payer: Medicare HMO

## 2019-12-20 ENCOUNTER — Encounter (HOSPITAL_COMMUNITY): Payer: Self-pay | Admitting: Emergency Medicine

## 2019-12-20 ENCOUNTER — Other Ambulatory Visit: Payer: Self-pay

## 2019-12-20 ENCOUNTER — Inpatient Hospital Stay (HOSPITAL_COMMUNITY)
Admission: EM | Admit: 2019-12-20 | Discharge: 2019-12-25 | DRG: 091 | Disposition: A | Discharge status: Skilled Nursing Facility | Payer: Medicare HMO | Attending: Internal Medicine | Admitting: Internal Medicine

## 2019-12-20 DIAGNOSIS — S0083XA Contusion of other part of head, initial encounter: Secondary | ICD-10-CM | POA: Diagnosis present

## 2019-12-20 DIAGNOSIS — N3000 Acute cystitis without hematuria: Secondary | ICD-10-CM | POA: Diagnosis present

## 2019-12-20 DIAGNOSIS — Y92009 Unspecified place in unspecified non-institutional (private) residence as the place of occurrence of the external cause: Secondary | ICD-10-CM | POA: Diagnosis not present

## 2019-12-20 DIAGNOSIS — G250 Essential tremor: Secondary | ICD-10-CM | POA: Diagnosis present

## 2019-12-20 DIAGNOSIS — E440 Moderate protein-calorie malnutrition: Secondary | ICD-10-CM | POA: Insufficient documentation

## 2019-12-20 DIAGNOSIS — I441 Atrioventricular block, second degree: Secondary | ICD-10-CM | POA: Diagnosis present

## 2019-12-20 DIAGNOSIS — Z20822 Contact with and (suspected) exposure to covid-19: Secondary | ICD-10-CM | POA: Diagnosis present

## 2019-12-20 DIAGNOSIS — F0280 Dementia in other diseases classified elsewhere without behavioral disturbance: Secondary | ICD-10-CM | POA: Diagnosis present

## 2019-12-20 DIAGNOSIS — I119 Hypertensive heart disease without heart failure: Secondary | ICD-10-CM | POA: Diagnosis present

## 2019-12-20 DIAGNOSIS — Z90710 Acquired absence of both cervix and uterus: Secondary | ICD-10-CM

## 2019-12-20 DIAGNOSIS — Z1620 Resistance to unspecified antibiotic: Secondary | ICD-10-CM | POA: Diagnosis present

## 2019-12-20 DIAGNOSIS — R410 Disorientation, unspecified: Secondary | ICD-10-CM

## 2019-12-20 DIAGNOSIS — R55 Syncope and collapse: Secondary | ICD-10-CM | POA: Diagnosis not present

## 2019-12-20 DIAGNOSIS — R52 Pain, unspecified: Secondary | ICD-10-CM

## 2019-12-20 DIAGNOSIS — Z8249 Family history of ischemic heart disease and other diseases of the circulatory system: Secondary | ICD-10-CM

## 2019-12-20 DIAGNOSIS — Z23 Encounter for immunization: Secondary | ICD-10-CM | POA: Diagnosis present

## 2019-12-20 DIAGNOSIS — Z6824 Body mass index (BMI) 24.0-24.9, adult: Secondary | ICD-10-CM | POA: Diagnosis not present

## 2019-12-20 DIAGNOSIS — R296 Repeated falls: Secondary | ICD-10-CM | POA: Diagnosis present

## 2019-12-20 DIAGNOSIS — N390 Urinary tract infection, site not specified: Secondary | ICD-10-CM

## 2019-12-20 DIAGNOSIS — M25552 Pain in left hip: Secondary | ICD-10-CM | POA: Diagnosis present

## 2019-12-20 DIAGNOSIS — R270 Ataxia, unspecified: Principal | ICD-10-CM | POA: Diagnosis present

## 2019-12-20 DIAGNOSIS — R748 Abnormal levels of other serum enzymes: Secondary | ICD-10-CM | POA: Diagnosis present

## 2019-12-20 DIAGNOSIS — E559 Vitamin D deficiency, unspecified: Secondary | ICD-10-CM | POA: Diagnosis present

## 2019-12-20 DIAGNOSIS — E785 Hyperlipidemia, unspecified: Secondary | ICD-10-CM | POA: Diagnosis present

## 2019-12-20 DIAGNOSIS — M1990 Unspecified osteoarthritis, unspecified site: Secondary | ICD-10-CM | POA: Diagnosis present

## 2019-12-20 DIAGNOSIS — G9341 Metabolic encephalopathy: Secondary | ICD-10-CM | POA: Diagnosis present

## 2019-12-20 DIAGNOSIS — R7303 Prediabetes: Secondary | ICD-10-CM | POA: Insufficient documentation

## 2019-12-20 DIAGNOSIS — W19XXXA Unspecified fall, initial encounter: Secondary | ICD-10-CM | POA: Diagnosis present

## 2019-12-20 DIAGNOSIS — I10 Essential (primary) hypertension: Secondary | ICD-10-CM | POA: Diagnosis present

## 2019-12-20 DIAGNOSIS — E119 Type 2 diabetes mellitus without complications: Secondary | ICD-10-CM | POA: Diagnosis present

## 2019-12-20 DIAGNOSIS — R8290 Unspecified abnormal findings in urine: Secondary | ICD-10-CM | POA: Diagnosis not present

## 2019-12-20 LAB — CBC WITH DIFFERENTIAL/PLATELET
Abs Immature Granulocytes: 0.02 10*3/uL (ref 0.00–0.07)
Basophils Absolute: 0 10*3/uL (ref 0.0–0.1)
Basophils Relative: 1 %
Eosinophils Absolute: 0 10*3/uL (ref 0.0–0.5)
Eosinophils Relative: 1 %
HCT: 35.8 % — ABNORMAL LOW (ref 36.0–46.0)
Hemoglobin: 11.6 g/dL — ABNORMAL LOW (ref 12.0–15.0)
Immature Granulocytes: 0 %
Lymphocytes Relative: 13 %
Lymphs Abs: 0.7 10*3/uL (ref 0.7–4.0)
MCH: 29.9 pg (ref 26.0–34.0)
MCHC: 32.4 g/dL (ref 30.0–36.0)
MCV: 92.3 fL (ref 80.0–100.0)
Monocytes Absolute: 0.5 10*3/uL (ref 0.1–1.0)
Monocytes Relative: 9 %
Neutro Abs: 4.2 10*3/uL (ref 1.7–7.7)
Neutrophils Relative %: 76 %
Platelets: 207 10*3/uL (ref 150–400)
RBC: 3.88 MIL/uL (ref 3.87–5.11)
RDW: 12.8 % (ref 11.5–15.5)
WBC: 5.5 10*3/uL (ref 4.0–10.5)
nRBC: 0 % (ref 0.0–0.2)

## 2019-12-20 LAB — COMPREHENSIVE METABOLIC PANEL
ALT: 15 U/L (ref 0–44)
AST: 28 U/L (ref 15–41)
Albumin: 3.4 g/dL — ABNORMAL LOW (ref 3.5–5.0)
Alkaline Phosphatase: 60 U/L (ref 38–126)
Anion gap: 10 (ref 5–15)
BUN: 19 mg/dL (ref 8–23)
CO2: 25 mmol/L (ref 22–32)
Calcium: 9.3 mg/dL (ref 8.9–10.3)
Chloride: 109 mmol/L (ref 98–111)
Creatinine, Ser: 0.67 mg/dL (ref 0.44–1.00)
GFR calc Af Amer: >60 mL/min (ref >60)
GFR calc non Af Amer: >60 mL/min (ref >60)
Glucose, Bld: 148 mg/dL — ABNORMAL HIGH (ref 70–99)
Potassium: 3.7 mmol/L (ref 3.5–5.1)
Sodium: 144 mmol/L (ref 135–145)
Total Bilirubin: 1 mg/dL (ref 0.3–1.2)
Total Protein: 6.7 g/dL (ref 6.5–8.1)

## 2019-12-20 LAB — LACTIC ACID, PLASMA: Lactic Acid, Venous: 1.1 mmol/L (ref 0.5–1.9)

## 2019-12-20 LAB — CK: Total CK: 365 U/L — ABNORMAL HIGH (ref 38–234)

## 2019-12-20 LAB — URINALYSIS, ROUTINE W REFLEX MICROSCOPIC
Bilirubin Urine: NEGATIVE
Glucose, UA: NEGATIVE mg/dL
Ketones, ur: 20 mg/dL — AB
Nitrite: NEGATIVE
Protein, ur: 100 mg/dL — AB
Specific Gravity, Urine: 1.018 (ref 1.005–1.030)
pH: 5 (ref 5.0–8.0)

## 2019-12-20 LAB — SARS CORONAVIRUS 2 BY RT PCR (HOSPITAL ORDER, PERFORMED IN ~~LOC~~ HOSPITAL LAB): SARS Coronavirus 2: NEGATIVE

## 2019-12-20 LAB — CBG MONITORING, ED: Glucose-Capillary: 78 mg/dL (ref 70–99)

## 2019-12-20 MED ORDER — SODIUM CHLORIDE 0.9 % IV SOLN
1.0000 g | Freq: Once | INTRAVENOUS | Status: AC
  Administered 2019-12-21: 1 g via INTRAVENOUS
  Filled 2019-12-20: qty 10

## 2019-12-20 MED ORDER — ONDANSETRON HCL 4 MG/2ML IJ SOLN: 4.0000 mg | Freq: Four times a day (QID) | INTRAMUSCULAR | Status: DC | PRN

## 2019-12-20 MED ORDER — ACETAMINOPHEN 325 MG PO TABS
650.0000 mg | ORAL_TABLET | Freq: Four times a day (QID) | ORAL | Status: DC | PRN
  Administered 2019-12-22: 650 mg via ORAL
  Filled 2019-12-20: qty 2

## 2019-12-20 MED ORDER — AMLODIPINE BESYLATE 5 MG PO TABS
5.0000 mg | ORAL_TABLET | Freq: Every day | ORAL | Status: DC
  Administered 2019-12-21 – 2019-12-25 (×5): 5 mg via ORAL
  Filled 2019-12-20 (×5): qty 1

## 2019-12-20 MED ORDER — INSULIN ASPART 100 UNIT/ML ~~LOC~~ SOLN
0.0000 [IU] | Freq: Three times a day (TID) | SUBCUTANEOUS | Status: DC
  Administered 2019-12-21: 2 [IU] via SUBCUTANEOUS
  Administered 2019-12-21 (×2): 3 [IU] via SUBCUTANEOUS
  Administered 2019-12-22: 2 [IU] via SUBCUTANEOUS
  Administered 2019-12-22: 5 [IU] via SUBCUTANEOUS
  Administered 2019-12-23 (×2): 3 [IU] via SUBCUTANEOUS
  Administered 2019-12-23 – 2019-12-24 (×2): 2 [IU] via SUBCUTANEOUS
  Administered 2019-12-24 – 2019-12-25 (×2): 3 [IU] via SUBCUTANEOUS
  Filled 2019-12-20: qty 0.15

## 2019-12-20 MED ORDER — SODIUM CHLORIDE 0.9 % IV BOLUS
500.0000 mL | Freq: Once | INTRAVENOUS | Status: AC
  Administered 2019-12-20: 500 mL via INTRAVENOUS

## 2019-12-20 MED ORDER — HYDRALAZINE HCL 20 MG/ML IJ SOLN
10.0000 mg | INTRAMUSCULAR | Status: DC | PRN
  Administered 2019-12-21: 10 mg via INTRAVENOUS
  Filled 2019-12-20: qty 1

## 2019-12-20 MED ORDER — SODIUM CHLORIDE 0.9% FLUSH
3.0000 mL | Freq: Two times a day (BID) | INTRAVENOUS | Status: DC
  Administered 2019-12-22 – 2019-12-23 (×3): 3 mL via INTRAVENOUS

## 2019-12-20 MED ORDER — ENOXAPARIN SODIUM 40 MG/0.4ML ~~LOC~~ SOLN
40.0000 mg | Freq: Every day | SUBCUTANEOUS | Status: DC
  Administered 2019-12-21 – 2019-12-24 (×5): 40 mg via SUBCUTANEOUS
  Filled 2019-12-20 (×5): qty 0.4

## 2019-12-20 MED ORDER — ONDANSETRON HCL 4 MG PO TABS: 4.0000 mg | ORAL_TABLET | Freq: Four times a day (QID) | ORAL | Status: DC | PRN

## 2019-12-20 MED ORDER — ACETAMINOPHEN 650 MG RE SUPP: 650.0000 mg | Freq: Four times a day (QID) | RECTAL | Status: DC | PRN

## 2019-12-20 MED ORDER — SODIUM CHLORIDE 0.9 % IV SOLN: INTRAVENOUS | Status: DC

## 2019-12-20 MED ORDER — SODIUM CHLORIDE 0.9 % IV SOLN: 1.0000 g | INTRAVENOUS | Status: DC

## 2019-12-20 MED ORDER — INSULIN ASPART 100 UNIT/ML ~~LOC~~ SOLN
0.0000 [IU] | Freq: Every day | SUBCUTANEOUS | Status: DC
  Administered 2019-12-22: 3 [IU] via SUBCUTANEOUS
  Filled 2019-12-20: qty 0.05

## 2019-12-20 NOTE — ED Triage Notes (Signed)
Patient BIBA from home after fall last night. Patient remained on floor all night and is soaked in urine. Bruise on L eyebrow. Patient AOx4 but EMS reports she is lethargic. Does not recall when she fell or LOC. Patient uses wheelchair at home.  BP 170/68 P 70 RR 18 CBG 112 T 99.0

## 2019-12-20 NOTE — ED Provider Notes (Signed)
[facility]-EMERGENCY DEPT Provider Note   CSN: [id number] Arrival date & time: 12/20/19  1612     History No chief complaint on file.   Carolyn Bishop is a 86 y.o. female.  Patient is an 86 year old female with a history of hyperlipidemia, diabetes, hypertension who presents with an episode of possible syncope.  EMS states that patient was noted to be on the floor after a fall last night.  The family had found her on the floor and at that time she did not want to be taken off the floor.  She wanted the family just to leave her where she was.  This afternoon, they were able to convince her to have EMS evaluate her and be transported to the hospital.  She does not remember why she is on the floor.  She does not know if she passed out or how she ended up on the floor.  She was noted to have urinary incontinence.  She was noted to have some bruising on her face but was oriented per EMS.  She was able to put weight on both legs and pivot to the stretcher.  She normally uses a wheelchair at home.  Patient does say that she has not felt well over the past 2 to 3 days with increased fatigue.  She has a little bit of cough but she says sometimes she has that mucus in her throat when the weather gets hot.  She does not have any known fevers.  She does not have any specific complaints of pain.  No urinary symptoms.  No chest pain or shortness of breath.        Past Medical History:  Diagnosis Date  . Arthritis   . Diabetes (HCC)   . HLD (hyperlipidemia)   . HTN (hypertension)   . Obesity   . Palpitations   . Vitamin D deficiency     Patient Active Problem List   Diagnosis Date Noted  . Fall at home 12/20/2019  . Syncope and collapse 12/20/2019  . Acute metabolic encephalopathy 12/20/2019  . Acute encephalopathy 10/28/2019  . Acute cystitis 10/28/2019  . Grade I diastolic dysfunction 10/28/2019  . HLD (hyperlipidemia) 10/28/2017  . Acute urinary retention   . AKI  (acute kidney injury) (HCC) 10/27/2017  . Hypotension 10/27/2017  . Diabetes mellitus without complication (HCC) 10/27/2017  . Essential hypertension 10/27/2017  . Hypoglycemia 10/27/2017  . Normocytic anemia 10/27/2017  . GIB (gastrointestinal bleeding) 10/27/2017  . Lung mass 10/27/2017  . Lactic acid acidosis 10/27/2017  . Left arm weakness 10/27/2017    Past Surgical History:  Procedure Laterality Date  . ABDOMINAL HYSTERECTOMY    . BIOPSY  10/29/2017   Procedure: BIOPSY;  Surgeon: Kerin Salen, MD;  Location: [facility];  Service: Gastroenterology;;  . ESOPHAGOGASTRODUODENOSCOPY (EGD) WITH PROPOFOL N/A 10/29/2017   Procedure: ESOPHAGOGASTRODUODENOSCOPY (EGD) WITH PROPOFOL;  Surgeon: Kerin Salen, MD;  Location: [facility];  Service: Gastroenterology;  Laterality: N/A;     OB History   No obstetric history on file.     Family History  Problem Relation Age of Onset  . Hypertension Mother   . Lung cancer Father     Social History   Tobacco Use  . Smoking status: Never Smoker  . Smokeless tobacco: Never Used  Substance Use Topics  . Alcohol use: No    Alcohol/week: 0.0 standard drinks  . Drug use: No    Home Medications Prior to Admission medications   Medication Sig Start  Date End Date Taking? Authorizing Provider  amLODipine (NORVASC) 5 MG tablet Take 1 tablet (5 mg total) by mouth daily. 11/01/19 10/31/20 Yes Pokhrel, Rebekah ChesterfieldLaxman, MD    Allergies    Patient has no known allergies.  Review of Systems   Review of Systems  Constitutional: Positive for fatigue. Negative for chills, diaphoresis and fever.  HENT: Positive for congestion and postnasal drip. Negative for rhinorrhea and sneezing.   Eyes: Negative.   Respiratory: Positive for cough. Negative for chest tightness and shortness of breath.   Cardiovascular: Negative for chest pain and leg swelling.  Gastrointestinal: Negative for abdominal pain, blood in stool, diarrhea, nausea and vomiting.  Genitourinary:  Negative for difficulty urinating, flank pain, frequency and hematuria.  Musculoskeletal: Negative for arthralgias and back pain.  Skin: Negative for rash.  Neurological: Positive for weakness (generalized). Negative for dizziness, speech difficulty, numbness and headaches.    Physical Exam Updated Vital Signs BP (!) 127/54 (BP Location: Left Arm)   Pulse (!) 59   Temp 98.1 F (36.7 C) (Oral)   Resp 18   Ht 5\' 3"  (1.6 m)   Wt 62.6 kg   SpO2 100%   BMI 24.45 kg/m   Physical Exam Constitutional:      Appearance: She is well-developed.  HENT:     Head: Normocephalic.     Comments: Patient has some right periorbital ecchymosis and some mild edema.  There is no bony deficits or significant tenderness noted.  Extraocular eye movements are intact.  There is a mild ecchymosis to the right maxilla.    Nose: Nose normal.  Eyes:     Pupils: Pupils are equal, round, and reactive to light.  Neck:     Comments: No specific areas of tenderness over the cervical, thoracic or lumbosacral spine.  No step-offs or deformities Cardiovascular:     Rate and Rhythm: Normal rate and regular rhythm.     Heart sounds: Normal heart sounds.  Pulmonary:     Effort: Pulmonary effort is normal. No respiratory distress.     Breath sounds: Normal breath sounds. No wheezing or rales.     Comments: Positive ecchymosis over the right chest wall, mild tenderness on palpation of the anterior chest wall, no crepitus or deformity Chest:     Chest wall: Tenderness present.  Abdominal:     General: Bowel sounds are normal.     Palpations: Abdomen is soft.     Tenderness: There is no abdominal tenderness. There is no guarding or rebound.  Musculoskeletal:        General: Normal range of motion.     Comments: There is some erythema overlying the right dorsal aspect of the wrist.  There is some mild tenderness to this area.  There is no tenderness on palpation of the elbow.  There is some mild tenderness on range of  motion of the right shoulder with some overlying ecchymosis of the shoulder.  There is some mild erythema and warmth to the medial aspect of the left anterior knee.  There is no significant joint effusion.  No circumferential warmth to the knee.  Mild tenderness on palpation of this area.  There is no pain on range of motion of the hips.  Peripheral pulses are intact.  Lymphadenopathy:     Cervical: No cervical adenopathy.  Skin:    General: Skin is warm and dry.     Findings: No rash.  Neurological:     Mental Status: She is alert and oriented  to person, place, and time.     ED Results / Procedures / Treatments   Labs (all labs ordered are listed, but only abnormal results are displayed) Labs Reviewed  COMPREHENSIVE METABOLIC PANEL - Abnormal; Notable for the following components:      Result Value   Glucose, Bld 148 (*)    Albumin 3.4 (*)    All other components within normal limits  CBC WITH DIFFERENTIAL/PLATELET - Abnormal; Notable for the following components:   Hemoglobin 11.6 (*)    HCT 35.8 (*)    All other components within normal limits  URINALYSIS, ROUTINE W REFLEX MICROSCOPIC - Abnormal; Notable for the following components:   Hgb urine dipstick SMALL (*)    Ketones, ur 20 (*)    Protein, ur 100 (*)    Leukocytes,Ua SMALL (*)    Bacteria, UA RARE (*)    All other components within normal limits  CK - Abnormal; Notable for the following components:   Total CK 365 (*)    All other components within normal limits  SARS CORONAVIRUS 2 BY RT PCR (HOSPITAL ORDER, PERFORMED IN [facility])  URINE CULTURE  LACTIC ACID, PLASMA  CBC  CREATININE, SERUM  MAGNESIUM  TSH  BASIC METABOLIC PANEL  CBC  HEMOGLOBIN A1C  CBG MONITORING, ED    EKG None     Radiology DG Chest 2 View  Result Date: 12/20/2019 CLINICAL DATA:  Fall EXAM: CHEST - 2 VIEW COMPARISON:  10/28/2019 FINDINGS: Cardiomegaly.  Lungs clear.  No effusions or acute bony abnormality.  IMPRESSION: Cardiomegaly.  No active disease. Electronically Signed   By: Charlett Nose M.D.   On: 12/20/2019 18:09   DG Shoulder Right  Result Date: 12/20/2019 CLINICAL DATA:  Fall, shoulder pain EXAM: RIGHT SHOULDER - 2+ VIEW COMPARISON:  None. FINDINGS: Advanced arthritic changes in the right wrist. Complete loss of the subacromial space. No fracture or dislocation. IMPRESSION: No acute bony abnormality.  Advanced arthritic changes. Electronically Signed   By: Charlett Nose M.D.   On: 12/20/2019 18:08   DG Wrist Complete Right  Result Date: 12/20/2019 CLINICAL DATA:  Fall, wrist pain EXAM: RIGHT WRIST - COMPLETE 3+ VIEW COMPARISON:  None. FINDINGS: Advanced degenerative changes at the radiocarpal joint. No acute bony abnormality. Specifically, no fracture, subluxation, or dislocation. IMPRESSION: No acute bony abnormality. Electronically Signed   By: Charlett Nose M.D.   On: 12/20/2019 18:08   DG Knee 2 Views Left  Result Date: 12/20/2019 CLINICAL DATA:  Fall, left knee pain EXAM: LEFT KNEE - 1-2 VIEW COMPARISON:  None. FINDINGS: Moderate tricompartment degenerative changes with joint space narrowing and spurring. No acute bony abnormality. Specifically, no fracture, subluxation, or dislocation. No joint effusion. IMPRESSION: Moderate degenerative changes.  No acute bony abnormality. Electronically Signed   By: Charlett Nose M.D.   On: 12/20/2019 18:09   CT Head Wo Contrast  Result Date: 12/20/2019 CLINICAL DATA:  Fall last night, found down. EXAM: CT HEAD WITHOUT CONTRAST CT MAXILLOFACIAL WITHOUT CONTRAST CT CERVICAL SPINE WITHOUT CONTRAST TECHNIQUE: Multidetector CT imaging of the head, cervical spine, and maxillofacial structures were performed using the standard protocol without intravenous contrast. Multiplanar CT image reconstructions of the cervical spine and maxillofacial structures were also generated. COMPARISON:  Head CT dated 07/10/2004. FINDINGS: CT HEAD FINDINGS Brain: There is  generalized age related parenchymal volume loss with commensurate dilatation of the ventricles and sulci. Chronic small vessel ischemic changes are noted within the bilateral periventricular and subcortical white matter  regions. No mass, hemorrhage, edema or other evidence of acute parenchymal abnormality. No extra-axial hemorrhage. Vascular: Chronic calcified atherosclerotic changes of the large vessels at the skull base. No unexpected hyperdense vessel. Skull: Normal. Negative for fracture or focal lesion. Other: None. CT MAXILLOFACIAL FINDINGS Osseous: Lower frontal bones are intact. No displaced nasal bone fracture. Osseous structures about the orbits appear intact and normally aligned bilaterally. Walls of the maxillary sinuses appear intact and normally aligned bilaterally. Bilateral zygomatic arches and pterygoid plates are intact. No mandible fracture or displacement is seen, although slight motion artifact limits characterization. Orbits: Negative. No traumatic or inflammatory finding. Sinuses: Mild mucosal thickening within the sphenoid sinus. No evidence of acute sinusitis. Soft tissues: Unremarkable.  No superficial soft tissue hematoma. CT CERVICAL SPINE FINDINGS Alignment: Levoscoliosis and straightening of the normal cervical spine lordosis, likely related to patient positioning and/or underlying degenerative change. No evidence of acute vertebral body subluxation. Skull base and vertebrae: No fracture line or displaced fracture fragment. Soft tissues and spinal canal: No prevertebral fluid or swelling. No visible canal hematoma. Disc levels: Degenerative spondylosis, mild to moderate in degree. Associated disc-osteophytic bulges at the C3-4 through C6-7 levels causing mild to moderate central canal stenoses. Upper chest: Atherosclerosis of the thoracic aortic arch, incompletely imaged. No acute findings. Other: Carotid atherosclerosis. IMPRESSION: 1. No acute intracranial abnormality. No intracranial  mass, hemorrhage or edema. No skull fracture. Chronic small vessel ischemic changes within the white matter. 2. No facial bone fracture or displacement. 3. No fracture or acute subluxation within the cervical spine. Degenerative spondylosis of the cervical spine, mild to moderate in degree, as detailed above. Aortic Atherosclerosis (ICD10-I70.0). Electronically Signed   By: Bary Richard M.D.   On: 12/20/2019 18:19   CT Cervical Spine Wo Contrast  Result Date: 12/20/2019 CLINICAL DATA:  Fall last night, found down. EXAM: CT HEAD WITHOUT CONTRAST CT MAXILLOFACIAL WITHOUT CONTRAST CT CERVICAL SPINE WITHOUT CONTRAST TECHNIQUE: Multidetector CT imaging of the head, cervical spine, and maxillofacial structures were performed using the standard protocol without intravenous contrast. Multiplanar CT image reconstructions of the cervical spine and maxillofacial structures were also generated. COMPARISON:  Head CT dated 07/10/2004. FINDINGS: CT HEAD FINDINGS Brain: There is generalized age related parenchymal volume loss with commensurate dilatation of the ventricles and sulci. Chronic small vessel ischemic changes are noted within the bilateral periventricular and subcortical white matter regions. No mass, hemorrhage, edema or other evidence of acute parenchymal abnormality. No extra-axial hemorrhage. Vascular: Chronic calcified atherosclerotic changes of the large vessels at the skull base. No unexpected hyperdense vessel. Skull: Normal. Negative for fracture or focal lesion. Other: None. CT MAXILLOFACIAL FINDINGS Osseous: Lower frontal bones are intact. No displaced nasal bone fracture. Osseous structures about the orbits appear intact and normally aligned bilaterally. Walls of the maxillary sinuses appear intact and normally aligned bilaterally. Bilateral zygomatic arches and pterygoid plates are intact. No mandible fracture or displacement is seen, although slight motion artifact limits characterization. Orbits:  Negative. No traumatic or inflammatory finding. Sinuses: Mild mucosal thickening within the sphenoid sinus. No evidence of acute sinusitis. Soft tissues: Unremarkable.  No superficial soft tissue hematoma. CT CERVICAL SPINE FINDINGS Alignment: Levoscoliosis and straightening of the normal cervical spine lordosis, likely related to patient positioning and/or underlying degenerative change. No evidence of acute vertebral body subluxation. Skull base and vertebrae: No fracture line or displaced fracture fragment. Soft tissues and spinal canal: No prevertebral fluid or swelling. No visible canal hematoma. Disc levels: Degenerative spondylosis, mild to moderate in degree.  Associated disc-osteophytic bulges at the C3-4 through C6-7 levels causing mild to moderate central canal stenoses. Upper chest: Atherosclerosis of the thoracic aortic arch, incompletely imaged. No acute findings. Other: Carotid atherosclerosis. IMPRESSION: 1. No acute intracranial abnormality. No intracranial mass, hemorrhage or edema. No skull fracture. Chronic small vessel ischemic changes within the white matter. 2. No facial bone fracture or displacement. 3. No fracture or acute subluxation within the cervical spine. Degenerative spondylosis of the cervical spine, mild to moderate in degree, as detailed above. Aortic Atherosclerosis (ICD10-I70.0). Electronically Signed   By: Bary Richard M.D.   On: 12/20/2019 18:19   CT Maxillofacial WO CM  Result Date: 12/20/2019 CLINICAL DATA:  Fall last night, found down. EXAM: CT HEAD WITHOUT CONTRAST CT MAXILLOFACIAL WITHOUT CONTRAST CT CERVICAL SPINE WITHOUT CONTRAST TECHNIQUE: Multidetector CT imaging of the head, cervical spine, and maxillofacial structures were performed using the standard protocol without intravenous contrast. Multiplanar CT image reconstructions of the cervical spine and maxillofacial structures were also generated. COMPARISON:  Head CT dated 07/10/2004. FINDINGS: CT HEAD FINDINGS  Brain: There is generalized age related parenchymal volume loss with commensurate dilatation of the ventricles and sulci. Chronic small vessel ischemic changes are noted within the bilateral periventricular and subcortical white matter regions. No mass, hemorrhage, edema or other evidence of acute parenchymal abnormality. No extra-axial hemorrhage. Vascular: Chronic calcified atherosclerotic changes of the large vessels at the skull base. No unexpected hyperdense vessel. Skull: Normal. Negative for fracture or focal lesion. Other: None. CT MAXILLOFACIAL FINDINGS Osseous: Lower frontal bones are intact. No displaced nasal bone fracture. Osseous structures about the orbits appear intact and normally aligned bilaterally. Walls of the maxillary sinuses appear intact and normally aligned bilaterally. Bilateral zygomatic arches and pterygoid plates are intact. No mandible fracture or displacement is seen, although slight motion artifact limits characterization. Orbits: Negative. No traumatic or inflammatory finding. Sinuses: Mild mucosal thickening within the sphenoid sinus. No evidence of acute sinusitis. Soft tissues: Unremarkable.  No superficial soft tissue hematoma. CT CERVICAL SPINE FINDINGS Alignment: Levoscoliosis and straightening of the normal cervical spine lordosis, likely related to patient positioning and/or underlying degenerative change. No evidence of acute vertebral body subluxation. Skull base and vertebrae: No fracture line or displaced fracture fragment. Soft tissues and spinal canal: No prevertebral fluid or swelling. No visible canal hematoma. Disc levels: Degenerative spondylosis, mild to moderate in degree. Associated disc-osteophytic bulges at the C3-4 through C6-7 levels causing mild to moderate central canal stenoses. Upper chest: Atherosclerosis of the thoracic aortic arch, incompletely imaged. No acute findings. Other: Carotid atherosclerosis. IMPRESSION: 1. No acute intracranial abnormality.  No intracranial mass, hemorrhage or edema. No skull fracture. Chronic small vessel ischemic changes within the white matter. 2. No facial bone fracture or displacement. 3. No fracture or acute subluxation within the cervical spine. Degenerative spondylosis of the cervical spine, mild to moderate in degree, as detailed above. Aortic Atherosclerosis (ICD10-I70.0). Electronically Signed   By: Bary Richard M.D.   On: 12/20/2019 18:19    Procedures Procedures (including critical care time)  Medications Ordered in ED Medications  cefTRIAXone (ROCEPHIN) 1 g in sodium chloride 0.9 % 100 mL IVPB (has no administration in time range)  amLODipine (NORVASC) tablet 5 mg (has no administration in time range)  enoxaparin (LOVENOX) injection 40 mg (has no administration in time range)  sodium chloride flush (NS) 0.9 % injection 3 mL (has no administration in time range)  0.9 %  sodium chloride infusion (has no administration in time range)  acetaminophen (  TYLENOL) tablet 650 mg (has no administration in time range)    Or  acetaminophen (TYLENOL) suppository 650 mg (has no administration in time range)  ondansetron (ZOFRAN) tablet 4 mg (has no administration in time range)    Or  ondansetron (ZOFRAN) injection 4 mg (has no administration in time range)  hydrALAZINE (APRESOLINE) injection 10 mg (has no administration in time range)  insulin aspart (novoLOG) injection 0-15 Units (has no administration in time range)  insulin aspart (novoLOG) injection 0-5 Units (has no administration in time range)  cefTRIAXone (ROCEPHIN) 1 g in sodium chloride 0.9 % 100 mL IVPB (has no administration in time range)  sodium chloride 0.9 % bolus 500 mL (500 mLs Intravenous New Bag/Given 12/20/19 2052)    ED Course  I have reviewed the triage vital signs and the nursing notes.  Pertinent labs & imaging results that were available during my care of the patient were reviewed by me and considered in my medical decision making  (see chart for details).    MDM Rules/Calculators/A&P                          Patient is a 86 year old female who presents with a syncopal episode during the night.  She does have some bruising to her face and her right side.  I do not see any evidence of acute traumatic injuries on imaging studies.  Her labs are nonconcerning.  Her urine does show some signs of infection but it is not overwhelming.  It was sent for culture.  She does not have an elevated lactate or other suggestions of sepsis.  No fever.  No focal neurologic deficits.  She does have some confusion.  Unclear how much this is a change from baseline.  Apparently EMS said that this was a change but have not been able to contact family.  EKG does show a second-degree type I Mobitz block.  She is perfusing well.  This will need likely cardiology evaluation at some point.  I spoke with Dr. Jacqulyn Bath who admit the patient for further treatment. Final Clinical Impression(s) / ED Diagnoses Final diagnoses:  Syncope, unspecified syncope type  Confusion  Urinary tract infection without hematuria, site unspecified  Heart block AV second degree    Rx / DC Orders ED Discharge Orders    None       Rolan Bucco, MD 12/21/19 [phone]

## 2019-12-20 NOTE — H&P (Signed)
History and Physical    Carolyn Bishop ZOX:[id number] DOB: 10/13/1932 DOA: 12/20/2019  PCP: Patient, No Pcp Per  Patient coming from: Home  I have personally briefly reviewed patient's old medical records in [facility]  Chief Complaint: Fall  HPI: Carolyn Bishop is a 86 y.o. female with medical history significant of frequent falls, hyperlipidemia, hypertension, type 2 diabetes mellitus who was recently hospitalized from 10/28/2019 through 11/01/2019 due to acute encephalopathy secondary to UTI and weakness and was discharged to skilled nursing facility.  Patient currently too confused to provide any history to me.  A very limited history was provided to me by ED physician.  According to them, patient fell sometime last night and was on the floor for several hours until her family convinced her to come to the ED and she was brought in by EMS today.  I then called patient's niece Carolyn Bishop over the phone who was very helpful with history.  According to her, patient was discharged to nursing home from hospital and then she was there about 3 to 4 weeks.  She returned back home about 3 weeks ago.  Per her, patient was not fit to return home however patient tends to be very stable and asked them to discharge her home.  Since she has returned home, she keeps falling every other day.  Carolyn lives next door to her and keeps on checking on her on daily basis.  Patient herself lives with her other two sisters as well.  Carolyn keeps asking her to come to the emergency department but patient continues to refuse that.  According to Carolyn, patient fell last night and Carolyn and her husband helped her to bring her to the bed and offered to call EMS but patient refused as usual.  They left her there.  When Carolyn went to see her again today, she was found on the floor.  According to her, she was likely there for a few hours.  It is unknown whether her sisters were at home or not.  Finally she was convinced to call  911 and she was brought into the emergency department.  Carolyn does not think that patient is passing out but she cannot rule that out completely either.  ED Course: Upon arrival to ED, patient was hemodynamically stable.  Slightly hypertensive.  Most of the lab work was unremarkable with slightly elevated CK level.  Due to her having some bruises on the face, she underwent extensive imaging studies including CT maxillofacial, CT cervical spine, CT head, chest x-ray, left knee x-ray, right shoulder x-ray and right wrist x-ray and all of them were unremarkable.  UA showed positive leukoesterase but no nitrites.  She was presumed to have UTI.  Hospital service was then consulted to admit the patient for further management.  Review of Systems: As per HPI otherwise negative.    Past Medical History:  Diagnosis Date  . Arthritis   . Diabetes (HCC)   . HLD (hyperlipidemia)   . HTN (hypertension)   . Obesity   . Palpitations   . Vitamin D deficiency     Past Surgical History:  Procedure Laterality Date  . ABDOMINAL HYSTERECTOMY    . BIOPSY  10/29/2017   Procedure: BIOPSY;  Surgeon: Kerin Salen, MD;  Location: [facility];  Service: Gastroenterology;;  . ESOPHAGOGASTRODUODENOSCOPY (EGD) WITH PROPOFOL N/A 10/29/2017   Procedure: ESOPHAGOGASTRODUODENOSCOPY (EGD) WITH PROPOFOL;  Surgeon: Kerin Salen, MD;  Location: [facility];  Service: Gastroenterology;  Laterality: N/A;  reports that she has never smoked. She has never used smokeless tobacco. She reports that she does not drink alcohol and does not use drugs.  No Known Allergies  Family History  Problem Relation Age of Onset  . Hypertension Mother   . Lung cancer Father     Prior to Admission medications   Medication Sig Start Date End Date Taking? Authorizing Provider  amLODipine (NORVASC) 5 MG tablet Take 1 tablet (5 mg total) by mouth daily. 11/01/19 10/31/20 Yes Joycelyn Das, MD    Physical Exam: Vitals:   12/20/19 2030  12/20/19 2056 12/20/19 2133 12/20/19 2302  BP: (!) 166/62 (!) 181/69  (!) 138/55  Pulse: (!) 59 65  (!) 57  Resp: 17 15  16   Temp:   98.9 F (37.2 C)   TempSrc:   Rectal   SpO2: 96% 100%  100%  Weight:      Height:        Constitutional: NAD, calm, comfortable Vitals:   12/20/19 2030 12/20/19 2056 12/20/19 2133 12/20/19 2302  BP: (!) 166/62 (!) 181/69  (!) 138/55  Pulse: (!) 59 65  (!) 57  Resp: 17 15  16   Temp:   98.9 F (37.2 C)   TempSrc:   Rectal   SpO2: 96% 100%  100%  Weight:      Height:       Eyes: PERRL, lids and conjunctivae normal ENMT: Mucous membranes are dry. Posterior pharynx clear of any exudate or lesions.Normal dentition.  Neck: normal, supple, no masses, no thyromegaly Respiratory: clear to auscultation bilaterally, no wheezing, no crackles. Normal respiratory effort. No accessory muscle use.  Cardiovascular: Regular rate and rhythm, no murmurs / rubs / gallops. No extremity edema. 2+ pedal pulses. No carotid bruits.  Abdomen: no tenderness, no masses palpated. No hepatosplenomegaly. Bowel sounds positive.  Musculoskeletal: no clubbing / cyanosis. No joint deformity upper and lower extremities. Good ROM, no contractures. Normal muscle tone.  Skin: no rashes, lesions, ulcers. No induration Neurologic: CN 2-12 grossly intact. Sensation intact, DTR normal. Strength 5/5 in all 4.  Psychiatric: Poor judgment and insight.  Slightly lethargic and completely disoriented.    Labs on Admission: I have personally reviewed following labs and imaging studies  CBC: Recent Labs  Lab 12/20/19 1637  WBC 5.5  NEUTROABS 4.2  HGB 11.6*  HCT 35.8*  MCV 92.3  PLT 207   Basic Metabolic Panel: Recent Labs  Lab 12/20/19 1637  NA 144  K 3.7  CL 109  CO2 25  GLUCOSE 148*  BUN 19  CREATININE 0.67  CALCIUM 9.3   GFR: Estimated Creatinine Clearance: 42.5 mL/min (by C-G formula based on SCr of 0.67 mg/dL). Liver Function Tests: Recent Labs  Lab 12/20/19 1637    AST 28  ALT 15  ALKPHOS 60  BILITOT 1.0  PROT 6.7  ALBUMIN 3.4*   No results for input(s): LIPASE, AMYLASE in the last 168 hours. No results for input(s): AMMONIA in the last 168 hours. Coagulation Profile: No results for input(s): INR, PROTIME in the last 168 hours. Cardiac Enzymes: Recent Labs  Lab 12/20/19 1637  CKTOTAL 365*   BNP (last 3 results) No results for input(s): PROBNP in the last 8760 hours. HbA1C: No results for input(s): HGBA1C in the last 72 hours. CBG: Recent Labs  Lab 12/20/19 2121  GLUCAP 78   Lipid Profile: No results for input(s): CHOL, HDL, LDLCALC, TRIG, CHOLHDL, LDLDIRECT in the last 72 hours. Thyroid Function Tests: No results for input(s):  TSH, T4TOTAL, FREET4, T3FREE, THYROIDAB in the last 72 hours. Anemia Panel: No results for input(s): VITAMINB12, FOLATE, FERRITIN, TIBC, IRON, RETICCTPCT in the last 72 hours. Urine analysis:    Component Value Date/Time   COLORURINE YELLOW 12/20/2019 2120   APPEARANCEUR CLEAR 12/20/2019 2120   LABSPEC 1.018 12/20/2019 2120   PHURINE 5.0 12/20/2019 2120   GLUCOSEU NEGATIVE 12/20/2019 2120   HGBUR SMALL (A) 12/20/2019 2120   BILIRUBINUR NEGATIVE 12/20/2019 2120   KETONESUR 20 (A) 12/20/2019 2120   PROTEINUR 100 (A) 12/20/2019 2120   UROBILINOGEN 0.2 02/21/2015 2355   NITRITE NEGATIVE 12/20/2019 2120   LEUKOCYTESUR SMALL (A) 12/20/2019 2120    Radiological Exams on Admission: DG Chest 2 View  Result Date: 12/20/2019 CLINICAL DATA:  Fall EXAM: CHEST - 2 VIEW COMPARISON:  10/28/2019 FINDINGS: Cardiomegaly.  Lungs clear.  No effusions or acute bony abnormality. IMPRESSION: Cardiomegaly.  No active disease. Electronically Signed   By: Charlett Nose M.D.   On: 12/20/2019 18:09   DG Shoulder Right  Result Date: 12/20/2019 CLINICAL DATA:  Fall, shoulder pain EXAM: RIGHT SHOULDER - 2+ VIEW COMPARISON:  None. FINDINGS: Advanced arthritic changes in the right wrist. Complete loss of the subacromial space. No  fracture or dislocation. IMPRESSION: No acute bony abnormality.  Advanced arthritic changes. Electronically Signed   By: Charlett Nose M.D.   On: 12/20/2019 18:08   DG Wrist Complete Right  Result Date: 12/20/2019 CLINICAL DATA:  Fall, wrist pain EXAM: RIGHT WRIST - COMPLETE 3+ VIEW COMPARISON:  None. FINDINGS: Advanced degenerative changes at the radiocarpal joint. No acute bony abnormality. Specifically, no fracture, subluxation, or dislocation. IMPRESSION: No acute bony abnormality. Electronically Signed   By: Charlett Nose M.D.   On: 12/20/2019 18:08   DG Knee 2 Views Left  Result Date: 12/20/2019 CLINICAL DATA:  Fall, left knee pain EXAM: LEFT KNEE - 1-2 VIEW COMPARISON:  None. FINDINGS: Moderate tricompartment degenerative changes with joint space narrowing and spurring. No acute bony abnormality. Specifically, no fracture, subluxation, or dislocation. No joint effusion. IMPRESSION: Moderate degenerative changes.  No acute bony abnormality. Electronically Signed   By: Charlett Nose M.D.   On: 12/20/2019 18:09   CT Head Wo Contrast  Result Date: 12/20/2019 CLINICAL DATA:  Fall last night, found down. EXAM: CT HEAD WITHOUT CONTRAST CT MAXILLOFACIAL WITHOUT CONTRAST CT CERVICAL SPINE WITHOUT CONTRAST TECHNIQUE: Multidetector CT imaging of the head, cervical spine, and maxillofacial structures were performed using the standard protocol without intravenous contrast. Multiplanar CT image reconstructions of the cervical spine and maxillofacial structures were also generated. COMPARISON:  Head CT dated 07/10/2004. FINDINGS: CT HEAD FINDINGS Brain: There is generalized age related parenchymal volume loss with commensurate dilatation of the ventricles and sulci. Chronic small vessel ischemic changes are noted within the bilateral periventricular and subcortical white matter regions. No mass, hemorrhage, edema or other evidence of acute parenchymal abnormality. No extra-axial hemorrhage. Vascular: Chronic  calcified atherosclerotic changes of the large vessels at the skull base. No unexpected hyperdense vessel. Skull: Normal. Negative for fracture or focal lesion. Other: None. CT MAXILLOFACIAL FINDINGS Osseous: Lower frontal bones are intact. No displaced nasal bone fracture. Osseous structures about the orbits appear intact and normally aligned bilaterally. Walls of the maxillary sinuses appear intact and normally aligned bilaterally. Bilateral zygomatic arches and pterygoid plates are intact. No mandible fracture or displacement is seen, although slight motion artifact limits characterization. Orbits: Negative. No traumatic or inflammatory finding. Sinuses: Mild mucosal thickening within the sphenoid sinus. No evidence of  acute sinusitis. Soft tissues: Unremarkable.  No superficial soft tissue hematoma. CT CERVICAL SPINE FINDINGS Alignment: Levoscoliosis and straightening of the normal cervical spine lordosis, likely related to patient positioning and/or underlying degenerative change. No evidence of acute vertebral body subluxation. Skull base and vertebrae: No fracture line or displaced fracture fragment. Soft tissues and spinal canal: No prevertebral fluid or swelling. No visible canal hematoma. Disc levels: Degenerative spondylosis, mild to moderate in degree. Associated disc-osteophytic bulges at the C3-4 through C6-7 levels causing mild to moderate central canal stenoses. Upper chest: Atherosclerosis of the thoracic aortic arch, incompletely imaged. No acute findings. Other: Carotid atherosclerosis. IMPRESSION: 1. No acute intracranial abnormality. No intracranial mass, hemorrhage or edema. No skull fracture. Chronic small vessel ischemic changes within the white matter. 2. No facial bone fracture or displacement. 3. No fracture or acute subluxation within the cervical spine. Degenerative spondylosis of the cervical spine, mild to moderate in degree, as detailed above. Aortic Atherosclerosis (ICD10-I70.0).  Electronically Signed   By: Bary RichardStan  Maynard M.D.   On: 12/20/2019 18:19   CT Cervical Spine Wo Contrast  Result Date: 12/20/2019 CLINICAL DATA:  Fall last night, found down. EXAM: CT HEAD WITHOUT CONTRAST CT MAXILLOFACIAL WITHOUT CONTRAST CT CERVICAL SPINE WITHOUT CONTRAST TECHNIQUE: Multidetector CT imaging of the head, cervical spine, and maxillofacial structures were performed using the standard protocol without intravenous contrast. Multiplanar CT image reconstructions of the cervical spine and maxillofacial structures were also generated. COMPARISON:  Head CT dated 07/10/2004. FINDINGS: CT HEAD FINDINGS Brain: There is generalized age related parenchymal volume loss with commensurate dilatation of the ventricles and sulci. Chronic small vessel ischemic changes are noted within the bilateral periventricular and subcortical white matter regions. No mass, hemorrhage, edema or other evidence of acute parenchymal abnormality. No extra-axial hemorrhage. Vascular: Chronic calcified atherosclerotic changes of the large vessels at the skull base. No unexpected hyperdense vessel. Skull: Normal. Negative for fracture or focal lesion. Other: None. CT MAXILLOFACIAL FINDINGS Osseous: Lower frontal bones are intact. No displaced nasal bone fracture. Osseous structures about the orbits appear intact and normally aligned bilaterally. Walls of the maxillary sinuses appear intact and normally aligned bilaterally. Bilateral zygomatic arches and pterygoid plates are intact. No mandible fracture or displacement is seen, although slight motion artifact limits characterization. Orbits: Negative. No traumatic or inflammatory finding. Sinuses: Mild mucosal thickening within the sphenoid sinus. No evidence of acute sinusitis. Soft tissues: Unremarkable.  No superficial soft tissue hematoma. CT CERVICAL SPINE FINDINGS Alignment: Levoscoliosis and straightening of the normal cervical spine lordosis, likely related to patient positioning  and/or underlying degenerative change. No evidence of acute vertebral body subluxation. Skull base and vertebrae: No fracture line or displaced fracture fragment. Soft tissues and spinal canal: No prevertebral fluid or swelling. No visible canal hematoma. Disc levels: Degenerative spondylosis, mild to moderate in degree. Associated disc-osteophytic bulges at the C3-4 through C6-7 levels causing mild to moderate central canal stenoses. Upper chest: Atherosclerosis of the thoracic aortic arch, incompletely imaged. No acute findings. Other: Carotid atherosclerosis. IMPRESSION: 1. No acute intracranial abnormality. No intracranial mass, hemorrhage or edema. No skull fracture. Chronic small vessel ischemic changes within the white matter. 2. No facial bone fracture or displacement. 3. No fracture or acute subluxation within the cervical spine. Degenerative spondylosis of the cervical spine, mild to moderate in degree, as detailed above. Aortic Atherosclerosis (ICD10-I70.0). Electronically Signed   By: Bary RichardStan  Maynard M.D.   On: 12/20/2019 18:19   CT Maxillofacial WO CM  Result Date: 12/20/2019 CLINICAL DATA:  Fall last night, found down. EXAM: CT HEAD WITHOUT CONTRAST CT MAXILLOFACIAL WITHOUT CONTRAST CT CERVICAL SPINE WITHOUT CONTRAST TECHNIQUE: Multidetector CT imaging of the head, cervical spine, and maxillofacial structures were performed using the standard protocol without intravenous contrast. Multiplanar CT image reconstructions of the cervical spine and maxillofacial structures were also generated. COMPARISON:  Head CT dated 07/10/2004. FINDINGS: CT HEAD FINDINGS Brain: There is generalized age related parenchymal volume loss with commensurate dilatation of the ventricles and sulci. Chronic small vessel ischemic changes are noted within the bilateral periventricular and subcortical white matter regions. No mass, hemorrhage, edema or other evidence of acute parenchymal abnormality. No extra-axial hemorrhage.  Vascular: Chronic calcified atherosclerotic changes of the large vessels at the skull base. No unexpected hyperdense vessel. Skull: Normal. Negative for fracture or focal lesion. Other: None. CT MAXILLOFACIAL FINDINGS Osseous: Lower frontal bones are intact. No displaced nasal bone fracture. Osseous structures about the orbits appear intact and normally aligned bilaterally. Walls of the maxillary sinuses appear intact and normally aligned bilaterally. Bilateral zygomatic arches and pterygoid plates are intact. No mandible fracture or displacement is seen, although slight motion artifact limits characterization. Orbits: Negative. No traumatic or inflammatory finding. Sinuses: Mild mucosal thickening within the sphenoid sinus. No evidence of acute sinusitis. Soft tissues: Unremarkable.  No superficial soft tissue hematoma. CT CERVICAL SPINE FINDINGS Alignment: Levoscoliosis and straightening of the normal cervical spine lordosis, likely related to patient positioning and/or underlying degenerative change. No evidence of acute vertebral body subluxation. Skull base and vertebrae: No fracture line or displaced fracture fragment. Soft tissues and spinal canal: No prevertebral fluid or swelling. No visible canal hematoma. Disc levels: Degenerative spondylosis, mild to moderate in degree. Associated disc-osteophytic bulges at the C3-4 through C6-7 levels causing mild to moderate central canal stenoses. Upper chest: Atherosclerosis of the thoracic aortic arch, incompletely imaged. No acute findings. Other: Carotid atherosclerosis. IMPRESSION: 1. No acute intracranial abnormality. No intracranial mass, hemorrhage or edema. No skull fracture. Chronic small vessel ischemic changes within the white matter. 2. No facial bone fracture or displacement. 3. No fracture or acute subluxation within the cervical spine. Degenerative spondylosis of the cervical spine, mild to moderate in degree, as detailed above. Aortic Atherosclerosis  (ICD10-I70.0). Electronically Signed   By: Bary Richard M.D.   On: 12/20/2019 18:19    EKG: Independently reviewed.  Wenckebach heart block  Assessment/Plan Active Problems:   Diabetes mellitus without complication (HCC)   Essential hypertension   Acute cystitis   Fall at home   Syncope and collapse   Acute metabolic encephalopathy   Frequent falls/?  Syncope: Based on the history that I was able to gather from the knees, I doubt that she was having any syncopal episodes.  She is likely having frequent falls likely secondary to aging and deconditioning.  However for the sake of completeness, I will obtain an echo to rule out any valvular heart disease.  She is slightly bradycardic.  Should repeat an EKG tomorrow morning.  Consult PT OT.  I wonder if she will be a candidate for long-term SNF.  Acute metabolic encephalopathy/UTI: Patient is not in position to verbalize any symptoms of UTI.  UA partially consistent with UTI.  Although I doubt this as being the source of her encephalopathy but at this point in time we will continue Rocephin and follow urine culture.  She has received one dose already.  Essential hypertension: Blood pressure slightly elevated.  She seems to be only on amlodipine which I will resume.  Placed on as needed hydralazine.  Type 2 diabetes mellitus: Diet controlled.  Recent hemoglobin A1c 6.3.  Repeat hemoglobin A1c.  Start on SSI.  Hyperlipidemia: I do not see any medications in her home medications.  Perhaps medication reconciliation is pending.   DVT prophylaxis: enoxaparin (LOVENOX) injection 40 mg Start: 12/20/19 2345 Code Status: Full code, normality, she has always been full code only because patient has never clarified her CODE STATUS and according normality, perhaps patient does not comprehend the concept of CODE STATUS.  We'll keep her full code by default for now.  Once patient is more alert, this should be discussed with patient.  Palliative care consult  might be helpful. Family Communication: None present at bedside.  Plan of care discussed with her niece, Carolyn Bishop.  Who is listed as her daughter however she is her niece. Disposition Plan: Discharge when stable.  Likely in 2 to 3 days Consults called: None Admission status: Inpatient   Status is: Inpatient  Remains inpatient appropriate because:Inpatient level of care appropriate due to severity of illness   Dispo: The patient is from: Home              Anticipated d/c is to: SNF              Anticipated d/c date is: 2 days              Patient currently is not medically stable to d/c.       Hughie Closs MD [facility]  12/20/2019, 11:37 PM  To contact the attending provider between 7A-7P or the covering provider during after hours 7P-7A, please log into the web site [email or web address]

## 2019-12-21 ENCOUNTER — Inpatient Hospital Stay (HOSPITAL_COMMUNITY): Payer: Medicare HMO

## 2019-12-21 DIAGNOSIS — R55 Syncope and collapse: Secondary | ICD-10-CM

## 2019-12-21 DIAGNOSIS — E119 Type 2 diabetes mellitus without complications: Secondary | ICD-10-CM

## 2019-12-21 DIAGNOSIS — R8290 Unspecified abnormal findings in urine: Secondary | ICD-10-CM

## 2019-12-21 DIAGNOSIS — R748 Abnormal levels of other serum enzymes: Secondary | ICD-10-CM

## 2019-12-21 LAB — URINE CULTURE: Culture: <10000 — AB

## 2019-12-21 LAB — GLUCOSE, CAPILLARY
Glucose-Capillary: 178 mg/dL — ABNORMAL HIGH (ref 70–99)
Glucose-Capillary: 176 mg/dL — ABNORMAL HIGH (ref 70–99)
Glucose-Capillary: 133 mg/dL — ABNORMAL HIGH (ref 70–99)
Glucose-Capillary: 111 mg/dL — ABNORMAL HIGH (ref 70–99)

## 2019-12-21 LAB — CBC
HCT: 33.9 % — ABNORMAL LOW (ref 36.0–46.0)
HCT: 35.2 % — ABNORMAL LOW (ref 36.0–46.0)
Hemoglobin: 11.1 g/dL — ABNORMAL LOW (ref 12.0–15.0)
Hemoglobin: 11.3 g/dL — ABNORMAL LOW (ref 12.0–15.0)
MCH: 30.3 pg (ref 26.0–34.0)
MCH: 30.3 pg (ref 26.0–34.0)
MCHC: 32.1 g/dL (ref 30.0–36.0)
MCHC: 32.7 g/dL (ref 30.0–36.0)
MCV: 92.6 fL (ref 80.0–100.0)
MCV: 94.4 fL (ref 80.0–100.0)
Platelets: 201 10*3/uL (ref 150–400)
Platelets: 201 10*3/uL (ref 150–400)
RBC: 3.66 MIL/uL — ABNORMAL LOW (ref 3.87–5.11)
RBC: 3.73 MIL/uL — ABNORMAL LOW (ref 3.87–5.11)
RDW: 12.8 % (ref 11.5–15.5)
RDW: 12.9 % (ref 11.5–15.5)
WBC: 5.4 10*3/uL (ref 4.0–10.5)
WBC: 5.9 10*3/uL (ref 4.0–10.5)
nRBC: 0 % (ref 0.0–0.2)
nRBC: 0 % (ref 0.0–0.2)

## 2019-12-21 LAB — BASIC METABOLIC PANEL
Anion gap: 13 (ref 5–15)
BUN: 16 mg/dL (ref 8–23)
CO2: 23 mmol/L (ref 22–32)
Calcium: 8.8 mg/dL — ABNORMAL LOW (ref 8.9–10.3)
Chloride: 108 mmol/L (ref 98–111)
Creatinine, Ser: 0.63 mg/dL (ref 0.44–1.00)
GFR calc Af Amer: >60 mL/min (ref >60)
GFR calc non Af Amer: >60 mL/min (ref >60)
Glucose, Bld: 177 mg/dL — ABNORMAL HIGH (ref 70–99)
Potassium: 3.4 mmol/L — ABNORMAL LOW (ref 3.5–5.1)
Sodium: 144 mmol/L (ref 135–145)

## 2019-12-21 LAB — CREATININE, SERUM
Creatinine, Ser: 0.69 mg/dL (ref 0.44–1.00)
GFR calc Af Amer: >60 mL/min (ref >60)
GFR calc non Af Amer: >60 mL/min (ref >60)

## 2019-12-21 LAB — HEMOGLOBIN A1C
Hgb A1c MFr Bld: 6.3 % — ABNORMAL HIGH (ref 4.8–5.6)
Mean Plasma Glucose: 134.11 mg/dL

## 2019-12-21 LAB — MAGNESIUM: Magnesium: 1.7 mg/dL (ref 1.7–2.4)

## 2019-12-21 LAB — ECHOCARDIOGRAM COMPLETE
Area-P 1/2: 3.03 cm2
S' Lateral: 2.5 cm

## 2019-12-21 LAB — CK: Total CK: 163 U/L (ref 38–234)

## 2019-12-21 LAB — TSH: TSH: 1.061 u[IU]/mL (ref 0.350–4.500)

## 2019-12-21 MED ORDER — POTASSIUM CHLORIDE 20 MEQ PO PACK
40.0000 meq | PACK | Freq: Once | ORAL | Status: AC
  Administered 2019-12-21: 40 meq via ORAL
  Filled 2019-12-21: qty 2

## 2019-12-21 MED ORDER — ENSURE MAX PROTEIN PO LIQD
11.0000 [oz_av] | Freq: Two times a day (BID) | ORAL | Status: DC
  Administered 2019-12-21 – 2019-12-25 (×6): 11 [oz_av] via ORAL
  Filled 2019-12-21 (×8): qty 330

## 2019-12-21 MED ORDER — ORAL CARE MOUTH RINSE
15.0000 mL | Freq: Two times a day (BID) | OROMUCOSAL | Status: DC
  Administered 2019-12-21 – 2019-12-24 (×4): 15 mL via OROMUCOSAL

## 2019-12-21 MED ORDER — PNEUMOCOCCAL VAC POLYVALENT 25 MCG/0.5ML IJ INJ
0.5000 mL | INJECTION | INTRAMUSCULAR | Status: AC
  Administered 2019-12-22: 0.5 mL via INTRAMUSCULAR
  Filled 2019-12-21: qty 0.5

## 2019-12-21 MED ORDER — ADULT MULTIVITAMIN W/MINERALS CH
1.0000 | ORAL_TABLET | Freq: Every day | ORAL | Status: DC
  Administered 2019-12-21 – 2019-12-25 (×5): 1 via ORAL
  Filled 2019-12-21 (×5): qty 1

## 2019-12-21 NOTE — Evaluation (Signed)
Occupational Therapy Evaluation Patient Details Name: Carolyn Bishop MRN: [id number] DOB: February 08, 1933 Today's Date: 12/21/2019    History of Present Illness 86 year old female with a history of hyperlipidemia, diabetes, HTN who presents after fall at home and found by family.   Clinical Impression   Ms. Carolyn Bishop is an 86 year old woman who presents to the hospital after multiple falls at home. On evaluation patient demonstrates grossly functional ROM and strength of upper extremities - without focal neurological deficits and what appears to be a chronic rotator cuff injury of right shoulder limiting shoulder ROM to grossly 90 degrees flexion with 3+/5 strength. Patient appears to have grossly functional coordination of upper extremities though exhibits dysmetria with finger to nose - favoringl visual deficits instead of ataxia. Patient able to perform finger to thumb and able to bring finger to her own nose and chin without significant deficits. When asked to reach out to touch therapist's finger patient exhibits several inches of dysmetria. Patient's vision assessed though results are questionable due to patient exhibiting difficulty describing visual impairment and following visual assessment directions. Patient able to grossly visually track but loses finger, has difficulty locating therapist hand in visual field, is unable to accurately state number of fingers therapist holds up in all visual quadrants, unable to read the clock, unable to state what is on the television or read large formatted words on the menu. Patient able to read the numbers on large clock on the wall - and perseverates on those numbers when asked to look at other objects. When therapist held a banana out for patient to name - patient exhibited difficulty locating object/hand (in different visual quadrants) and unable to name item. In regards to mobility patient reports dizziness/light headedness/"weird feeling in eyes and head"  with movement . Patient rolled left and right with mod assist - with more complaints of dizziness to the left but no nystagmus noted. Patient stated "I can't really see you" when asked to look around when on her left side. Max assist for supine to sit and vice versa due to posterior lean, pushing back and fear of falling with sitting at edge of bed and once again complaints of dizziness. Once again, no nystagmus noted. Able to have patient sit at edge of bed for several minutes but with posterior lean. Patient able to be cued forward but continues to need assistance to stay in upright position. Patient max-total assist for ADLs at this time due to limitations with mobility and vision. Patient will benefit from skilled OT services to improve deficits and learn compensatory strategies in order to improve functional abilities. Patient does not exhibit the skills needed to return home at this time. Patient will need short term rehab at discharge.    Follow Up Recommendations  SNF    Equipment Recommendations  Other (comment) (TBD)    Recommendations for Other Services       Precautions / Restrictions Precautions Precautions: Fall Precaution Comments: Fearful of falling but also reports dizziness/"feeling weird" with movement Restrictions Weight Bearing Restrictions: No      Mobility Bed Mobility Overal bed mobility: Needs Assistance Bed Mobility: Rolling Rolling: Mod assist   Supine to sit: Max assist Sit to supine: Max assist   General bed mobility comments: Mod assist to roll left and right. Max assist for transfer into sitting. Limited by dizziness/vertigo. Returned to supine with max assist needing assistance for lower extremities and trunk negotation. Patient reported dizziness or her eyes feeling funny with all movement.  Transfers Overall transfer level: Needs assistance Equipment used: Rolling walker (2 wheeled) Transfers: Sit to/from Stand Sit to Stand: Total assist;+2 physical  assistance         General transfer comment: attempted STS with max A x2, heavy cues on engaging BLE to power up with BUE assisting, pt unable to fully clear bottom from bedside, uses BUE to push trunk into extension, verbalizes fear of falling, blocking bil feet to prevent sliding forward    Balance Overall balance assessment: Needs assistance;History of Falls Sitting-balance support: Feet unsupported;Bilateral upper extremity supported Sitting balance-Leahy Scale: Poor Sitting balance - Comments: Initially propped with BUEs at edge of bed. Therapist able to cue patient forward but having to assist to stay upright. Postural control: Posterior lean Standing balance support: During functional activity;Bilateral upper extremity supported Standing balance-Leahy Scale: Zero Standing balance comment: unable to achieve full upright standing                           ADL either performed or assessed with clinical judgement   ADL Overall ADL's : Needs assistance/impaired Eating/Feeding: Maximal assistance;Bed level   Grooming: Set up;Wash/dry face;Wash/dry hands;Bed level   Upper Body Bathing: Bed level;Moderate assistance   Lower Body Bathing: Maximal assistance;Bed level   Upper Body Dressing : Maximal assistance;Bed level   Lower Body Dressing: Total assistance;Bed level     Toilet Transfer Details (indicate cue type and reason): unable Toileting- Clothing Manipulation and Hygiene: Total assistance     Tub/Shower Transfer Details (indicate cue type and reason): unable   General ADL Comments: unable     Vision   Vision Assessment?: Yes Additional Comments: Visual Assessment performed but limited in quality due to patient not being a reliable testing subject. Patient reports wearing glasses but them being broken recently. Is unable to report vision history and reports differences with vision since breaking her glasses but unable to expound upon.Patient exhibited  grossly functional occular ROM with visual tracking though exhibited difficulty sustaining tracking. With attempts at peripheral vision testing - patient appeared to have more difficulty on the left than right - reporting seeing less on the left. Patient unable to name fingers therapist holding up in every quadrant. Patient unable to read the time on the clock but started reading off the numbers around the clock. Only able to read "Call" and then said "11" on the "Call, Don't Fall" sign. Then began to perseverate on the numbers on the clock. Only able to read Menu on the cafeteria menu in room and approx 10 inches from her face. Once again, perseverated on nummbers. Patient able to state therapist's hair color and that therapist had light eyes but unable to see key on therapist's badge. Patient had great difficulty locating therapist hand during testing. Patient unable to state what was showing on the television - and then once again perseverating on the numbers of the clock - which is left of the television. Therapist held up a banana and asked patient what was held in hand - and placed in different visual quadrants and stil unable to state what is was.     Perception     Praxis      Pertinent Vitals/Pain Pain Assessment: No/denies pain     Hand Dominance Right   Extremity/Trunk Assessment Upper Extremity Assessment Upper Extremity Assessment: RUE deficits/detail;LUE deficits/detail RUE Deficits / Details: 3+/5 shoulder strength, 4+/5 bicep, 4/5 tricep, 5/5 wrist, 4-/5 grip; reports normal sensation, able to grossly perform  finger to thumb slowly. With nose to therapist's finger patient demonstrates dysmetria. With finger to chin - patient grossly able to perform. LUE Deficits / Details: 4/5 shoulder strength, 4+/5 bicep, 4/5 tricep, 5/5 wrist, 4-/5 grip; reports normal sensation, able to grossly perform finger to thumb slowly. With nose to therapist's finger patient exhibits dysmetria.   Lower  Extremity Assessment Lower Extremity Assessment: RLE deficits/detail;LLE deficits/detail RLE Deficits / Details: AROM <50%, strength 2-/5, denies numbness/tingling throughout LLE Deficits / Details: AROM <50%, strength 2-/5, denies numbness/tingling throughout   Cervical / Trunk Assessment Cervical / Trunk Assessment: Kyphotic   Communication Communication Communication: Other (comment) (unable to determine if pt losing train of thoughout or difficulty articulating thoughts)   Cognition Arousal/Alertness: Awake/alert Behavior During Therapy: WFL for tasks assessed/performed Overall Cognitive Status: No family/caregiver present to determine baseline cognitive functioning                                 General Comments: Patient able to state name, year and birthdate. [facility] [place]. Her thoughts are somewhat rambling. She knows the President's last name starts with a B. She knows the prior President was from [place] and has a lot of money. Patient has some difficulty with word finding and describing.   General Comments  NT in room assessing orthostatics during eval    Exercises     Shoulder Instructions      Home Living Family/patient expects to be discharged to:: Skilled nursing facility Living Arrangements: Other relatives (2 sisters)                               Additional Comments: Niece next door      Prior Functioning/Environment Level of Independence: Needs assistance  Gait / Transfers Assistance Needed: Pt difficult to follow- reports she was able to ascend/descend steps with handrail at home, but also uses WC around the home ADL's / Homemaking Assistance Needed: Pt difficult to follow- reports she was independent with bathing, dressing, cooking and cleaning, but also that her niece comes over and looks after pt and pt 2 sisters   Comments: Pt endorses multiple falls, unable to explain how she gets up or how  family assists her off floor        OT Problem List: Decreased activity tolerance;Impaired balance (sitting and/or standing);Impaired vision/perception;Decreased coordination;Decreased cognition;Decreased safety awareness;Decreased knowledge of use of DME or AE      OT Treatment/Interventions: Self-care/ADL training;Therapeutic exercise;Neuromuscular education;DME and/or AE instruction;Cognitive remediation/compensation;Visual/perceptual remediation/compensation;Patient/family education;Therapeutic activities;Balance training    OT Goals(Current goals can be found in the care plan section) Acute Rehab OT Goals Patient Stated Goal: Did not state OT Goal Formulation: With patient Time For Goal Achievement: 01/04/20 Potential to Achieve Goals: Fair  OT Frequency: Min 2X/week   Barriers to D/C:            Co-evaluation              AM-PAC OT "6 Clicks" Daily Activity     Outcome Measure Help from another person eating meals?: Total Help from another person taking care of personal grooming?: A Little Help from another person toileting, which includes using toliet, bedpan, or urinal?: Total Help from another person bathing (including washing, rinsing, drying)?: A Lot Help from another person to put on and taking off regular upper body clothing?: A  Lot Help from another person to put on and taking off regular lower body clothing?: Total 6 Click Score: 10   End of Session Nurse Communication: Mobility status (vision)  Activity Tolerance: Patient tolerated treatment well Patient left: in bed;with bed alarm set  OT Visit Diagnosis: Unsteadiness on feet (R26.81);Repeated falls (R29.6);Low vision, both eyes (H54.2);Other symptoms and signs involving cognitive function;Dizziness and giddiness (R42)                Time: 1530-1605 OT Time Calculation (min): 35 min Charges:  OT General Charges $OT Visit: 1 Visit OT Evaluation $OT Eval Moderate Complexity: 1 Mod  Jontavious Commons,  OTR/L Acute Care Rehab Services  Office [phone] Pager: [phone]   Kelli Churn 12/21/2019, 4:40 PM

## 2019-12-21 NOTE — ED Notes (Signed)
Report called later due to emergency situations with another patient

## 2019-12-21 NOTE — Evaluation (Signed)
Physical Therapy Evaluation Patient Details Name: Carolyn Bishop MRN: [id number] DOB: Oct 23, 1932 Today's Date: 12/21/2019   History of Present Illness  86 year old female with a history of hyperlipidemia, diabetes, HTN who presents after fall at home and found by family.  Clinical Impression  Pt admitted with above diagnosis. Per chart review, pt recently at SNF for rehab but was discharged home where she was living with 2 sisters, falling frequently, using WC for mobility. Pt reports she was able to ascend/descend steps, but also used WC to walk; also, that she was independent with ADLs, but niece comes over to look after pt and 2 sisters. Pt currently requiring mod A to come to EOB, unable to achieve full upright standing, and appears to become very fearful of falling that she becomes stiff and requiring total assist to return safely to supine from seated EOB. Pt reports her feet are slipping out from under her and asking therapists and nursing "am I safe like this, will I fall" when supine in bed; all repeatedly reassured pt she is safe, supine in bed, and will not fall. Pt currently with functional limitations due to the deficits listed below (see PT Problem List). Pt will benefit from skilled PT to increase their independence and safety with mobility to allow discharge to the venue listed below.       Follow Up Recommendations SNF    Equipment Recommendations  None recommended by PT    Recommendations for Other Services       Precautions / Restrictions Precautions Precautions: Fall Precaution Comments: very fearful of falling Restrictions Weight Bearing Restrictions: No      Mobility  Bed Mobility Overal bed mobility: Needs Assistance Bed Mobility: Supine to Sit;Sit to Supine  Supine to sit: Mod assist Sit to supine: Total assist   General bed mobility comments: slow, labored movement, mod A to bring BLE to EOB and to upright trunk and to scoot to EOB while preventing trunk loss  of balance; after attempting STS, pt verbalizes high fear of falling, begins shaking in fear and stating "don't let me fall" and "my feet are slipping" while seated EOB requiring total assist to transfer back to supine safely  Transfers Overall transfer level: Needs assistance Equipment used: Rolling walker (2 wheeled) Transfers: Sit to/from Stand Sit to Stand: Total assist;+2 physical assistance  General transfer comment: attempted STS with max A x2, heavy cues on engaging BLE to power up with BUE assisting, pt unable to fully clear bottom from bedside, uses BUE to push trunk into extension, verbalizes fear of falling, blocking bil feet to prevent sliding forward  Ambulation/Gait  General Gait Details: not attempted  Stairs            Wheelchair Mobility    Modified Rankin (Stroke Patients Only)       Balance Overall balance assessment: Needs assistance;History of Falls Sitting-balance support: Feet supported;Bilateral upper extremity supported Sitting balance-Leahy Scale: Zero Sitting balance - Comments: initially min A to maintain sitting, progresses to max A with fear of falling   Standing balance support: During functional activity;Bilateral upper extremity supported Standing balance-Leahy Scale: Zero Standing balance comment: unable to achieve full upright standing            Pertinent Vitals/Pain Pain Assessment: No/denies pain    Home Living Family/patient expects to be discharged to:: Skilled nursing facility Living Arrangements: Other relatives (2 sisters)               Additional Comments: Niece next door  Prior Function Level of Independence: Needs assistance   Gait / Transfers Assistance Needed: Pt difficult to follow- reports she was able to ascend/descend steps with handrail at home, but also uses WC around the home  ADL's / Homemaking Assistance Needed: Pt difficult to follow- reports she was independent with bathing, dressing, cooking and  cleaning, but also that her niece comes over and looks after pt and pt 2 sisters  Comments: Pt endorses multiple falls, unable to explain how she gets up or how family assists her off floor     Hand Dominance   Dominant Hand: Right    Extremity/Trunk Assessment   Upper Extremity Assessment Upper Extremity Assessment: Defer to OT evaluation    Lower Extremity Assessment Lower Extremity Assessment: RLE deficits/detail;LLE deficits/detail RLE Deficits / Details: AROM <50%, strength 2-/5, denies numbness/tingling throughout LLE Deficits / Details: AROM <50%, strength 2-/5, denies numbness/tingling throughout    Cervical / Trunk Assessment Cervical / Trunk Assessment: Kyphotic  Communication   Communication: Other (comment) (unable to determine if pt losing train of thoughout or difficulty articulating thoughts)  Cognition Arousal/Alertness: Awake/alert Behavior During Therapy: WFL for tasks assessed/performed Overall Cognitive Status: No family/caregiver present to determine baseline cognitive functioning    General Comments: Pt oriented to self, situation, year only, and in hospital (unable to guess name but says in [place], [place]). Pt appears to lose train of thought possibly, or difficulty articulating thoughts.      General Comments General comments (skin integrity, edema, etc.): NT in room assessing orthostatics during eval    Exercises     Assessment/Plan    PT Assessment Patient needs continued PT services  PT Problem List Decreased strength;Decreased range of motion;Decreased activity tolerance;Decreased balance;Decreased mobility;Decreased coordination;Decreased cognition;Decreased knowledge of use of DME;Decreased safety awareness       PT Treatment Interventions DME instruction;Gait training;Functional mobility training;Therapeutic activities;Therapeutic exercise;Balance training;Neuromuscular re-education;Cognitive remediation;Patient/family education    PT  Goals (Current goals can be found in the Care Plan section)  Acute Rehab PT Goals Patient Stated Goal: "I don't want to fall" PT Goal Formulation: With patient Time For Goal Achievement: 01/04/20 Potential to Achieve Goals: Fair    Frequency Min 2X/week   Barriers to discharge        Co-evaluation               AM-PAC PT "6 Clicks" Mobility  Outcome Measure Help needed turning from your back to your side while in a flat bed without using bedrails?: A Lot Help needed moving from lying on your back to sitting on the side of a flat bed without using bedrails?: A Lot Help needed moving to and from a bed to a chair (including a wheelchair)?: Total Help needed standing up from a chair using your arms (e.g., wheelchair or bedside chair)?: Total Help needed to walk in hospital room?: Total Help needed climbing 3-5 steps with a railing? : Total 6 Click Score: 8    End of Session Equipment Utilized During Treatment: Gait belt Activity Tolerance: Patient limited by fatigue;Other (comment) (limited by fear of falling) Patient left: in bed;with call bell/phone within reach;with bed alarm set Nurse Communication: Mobility status;Other (comment) (NT in room assisting with transfers, RN in room once transferred back to supine due to fear of falling) PT Visit Diagnosis: Unsteadiness on feet (R26.81);Other abnormalities of gait and mobility (R26.89);Repeated falls (R29.6);Muscle weakness (generalized) (M62.81);History of falling (Z91.81)    Time: 1030-1102 PT Time Calculation (min) (ACUTE ONLY): 32 min   Charges:  PT Evaluation $PT Eval Moderate Complexity: 1 Mod PT Treatments $Therapeutic Activity: 8-22 mins         Tori Edelin Fryer PT, DPT 12/21/19, 1:11 PM

## 2019-12-21 NOTE — Progress Notes (Signed)
2nd attempt for report.Carolyn Bishop

## 2019-12-21 NOTE — Progress Notes (Signed)
Echocardiogram 2D Echocardiogram has been performed.  Carolyn Bishop 12/21/2019, 11:45 AM

## 2019-12-21 NOTE — Progress Notes (Signed)
Initial Nutrition Assessment  DOCUMENTATION CODES:   Non-severe (moderate) malnutrition in context of acute illness/injury  INTERVENTION:  -Recommend liberalizing diet to regular -Ensure Max po BID, each supplement provides 150 kcal and 30 grams of protein -MVI daily  NUTRITION DIAGNOSIS:   Moderate Malnutrition related to acute illness (acute encephalopathy, UTI) as evidenced by mild fat depletion, mild muscle depletion.    GOAL:   Patient will meet greater than or equal to 90% of their needs    MONITOR:   PO intake, Supplement acceptance, Labs, Weight trends, I & O's  REASON FOR ASSESSMENT:   Malnutrition Screening Tool    ASSESSMENT:   Pt admitted for frequent falls. PMH includes HLD, HTN, type 2 DM, recent admission for acute encephalopathy 2/2 UTI.   Pt's niece reports that pt has been falling every other day and has been refusing EMS until last night.    Pt states that her appetite is good and that PTA she ate 3x/day. For breakfast, she has a hard-boiled egg, toast with peanut butter, and fruit. Lunch is often more of a snack, and dinner consists of a protein and a vegetable.   Limited wt hx available for review; however, no significant wt changes noted. Pt denies wt loss PTA.   No PO intake documented.  Labs: K+ 3.4 (L), CBGs 78-178 Medications: Novolog, Klor-con  NUTRITION - FOCUSED PHYSICAL EXAM:    Most Recent Value  Orbital Region No depletion  Upper Arm Region Mild depletion  Thoracic and Lumbar Region Mild depletion  Buccal Region Mild depletion  Temple Region Moderate depletion  Clavicle Bone Region Mild depletion  Clavicle and Acromion Bone Region Mild depletion  Scapular Bone Region Mild depletion  Dorsal Hand Mild depletion  Patellar Region Mild depletion  Anterior Thigh Region Mild depletion  Posterior Calf Region Mild depletion  Edema (RD Assessment) None  Hair Reviewed  Eyes Reviewed  Mouth Reviewed  Skin Reviewed  Nails Reviewed        Diet Order:   Diet Order            Diet heart healthy/carb modified Room service appropriate? Yes; Fluid consistency: Thin  Diet effective now                 EDUCATION NEEDS:   No education needs have been identified at this time  Skin:  Skin Assessment: Reviewed RN Assessment  Last BM:  PTA  Height:   Ht Readings from Last 1 Encounters:  12/20/19 5\' 3"  (1.6 m)    Weight:   Wt Readings from Last 10 Encounters:  12/20/19 62.6 kg  10/31/19 63.1 kg  11/07/17 72.6 kg  10/27/17 54.4 kg    BMI:  Body mass index is 24.45 kg/m.  Estimated Nutritional Needs:   Kcal:  1450-1650  Protein:  75-85 grams  Fluid:  >/= 1.45L/d    10/29/17, MS, RD, LDN RD pager number and weekend/on-call pager number located in Amion.

## 2019-12-21 NOTE — Progress Notes (Addendum)
PROGRESS NOTE    Carolyn Bishop  UXN:[id number]  DOB: November 25, 1932  PCP: Patient, No Pcp Per Admit date:12/20/2019 Chief compliant: Recurrent falls 86 year old female with history of hyperlipidemia, hypertension, DM type II, frequent falls who was recently hospitalized from 6/20- 6/24 due to acute encephalopathy secondary to Bactrim resistant E .coli UTI and weakness -> was discharged to SNF-->Went home 3 weeks back-> back in hospital with recurrent falls, lives alone, niece next door and convinced her to come to ED.  ED Course: Afebrile, Slightly hypertensive.  Most of the lab work was unremarkable with slightly elevated CK level at 365.  Potassium 3.7. Due to her having some bruises on the face, she underwent extensive imaging studies including CT maxillofacial, CT cervical spine, CT head, chest x-ray, left knee x-ray, right shoulder x-ray and right wrist x-ray and all of them were unremarkable.  UA showed positive leukoesterase but no nitrites.  She was presumed to have UTI. Hospital course: Patient admitted to [facility] for further evaluation and management   Subjective:  Patient resting comfortably and undergoing echocardiogram when seen in rounds. She denies feeling lightheaded or dizzy before falls.  She feels it is her balance that is limiting safe ambulation.  She denies any dysuria, headache or chest pain.  Objective: Vitals:   12/21/19 0316 12/21/19 0519 12/21/19 1045 12/21/19 1048  BP: (!) 130/48 (!) 132/49 (!) 132/56 (!) 164/69  Pulse: 77 74 82 84  Resp:  15 20   Temp:  (!) 97.4 F (36.3 C) 98.7 F (37.1 C)   TempSrc:  Oral Oral   SpO2:  99% 100% 100%  Weight:      Height:        Intake/Output Summary (Last 24 hours) at 12/21/2019 1452 Last data filed at 12/21/2019 2542 Gross per 24 hour  Intake 1246.67 ml  Output --  Net 1246.67 ml   Filed Weights   12/20/19 1641  Weight: 62.6 kg    Physical Examination: General: Early female who appears awake alert, no acute distress  noted Head ENT: Atraumatic normocephalic, PERRLA, neck supple Heart: S1-S2 heard, regular rate and rhythm, no murmurs.  No leg edema noted Lungs: Equal air entry bilaterally, no rhonchi or rales on exam, no accessory muscle use Abdomen: Bowel sounds heard, soft, nontender, nondistended. No organomegaly.  No CVA tenderness Extremities: No pedal edema.  No cyanosis or clubbing. Neurological: Awake alert oriented x3, no focal weakness or numbness, strength and sensations to crude touch intact Skin: Mild ecchymosis/bruise noted on the right side of the face which appears to be resolving  Data Reviewed: I have personally reviewed following labs and imaging studies  CBC: Recent Labs  Lab 12/20/19 1637 12/21/19 0031 12/21/19 0545  WBC 5.5 5.4 5.9  NEUTROABS 4.2  --   --   HGB 11.6* 11.1* 11.3*  HCT 35.8* 33.9* 35.2*  MCV 92.3 92.6 94.4  PLT 207 201 201   Basic Metabolic Panel: Recent Labs  Lab 12/20/19 1637 12/21/19 0031 12/21/19 0545  NA 144  --  144  K 3.7  --  3.4*  CL 109  --  108  CO2 25  --  23  GLUCOSE 148*  --  177*  BUN 19  --  16  CREATININE 0.67 0.69 0.63  CALCIUM 9.3  --  8.8*  MG  --  1.7  --    GFR: Estimated Creatinine Clearance: 42.5 mL/min (by C-G formula based on SCr of 0.63 mg/dL). Liver Function Tests: Recent Labs  Lab  12/20/19 1637  AST 28  ALT 15  ALKPHOS 60  BILITOT 1.0  PROT 6.7  ALBUMIN 3.4*   No results for input(s): LIPASE, AMYLASE in the last 168 hours. No results for input(s): AMMONIA in the last 168 hours. Coagulation Profile: No results for input(s): INR, PROTIME in the last 168 hours. Cardiac Enzymes: Recent Labs  Lab 12/20/19 1637  CKTOTAL 365*   BNP (last 3 results) No results for input(s): PROBNP in the last 8760 hours. HbA1C: Recent Labs    12/21/19 0030  HGBA1C 6.3*   CBG: Recent Labs  Lab 12/20/19 2121 12/21/19 0746 12/21/19 1229  GLUCAP 78 178* 176*   Lipid Profile: No results for input(s): CHOL, HDL,  LDLCALC, TRIG, CHOLHDL, LDLDIRECT in the last 72 hours. Thyroid Function Tests: Recent Labs    12/21/19 0030  TSH 1.061   Anemia Panel: No results for input(s): VITAMINB12, FOLATE, FERRITIN, TIBC, IRON, RETICCTPCT in the last 72 hours. Sepsis Labs: Recent Labs  Lab 12/20/19 1637  LATICACIDVEN 1.1    Recent Results (from the past 240 hour(s))  SARS Coronavirus 2 by RT PCR (hospital order, performed in [facility] hospital lab) Nasopharyngeal Nasopharyngeal Swab     Status: None   Collection Time: 12/20/19  4:39 PM   Specimen: Nasopharyngeal Swab  Result Value Ref Range Status   SARS Coronavirus 2 NEGATIVE NEGATIVE Final    Comment: (NOTE) SARS-CoV-2 target nucleic acids are NOT DETECTED.  The SARS-CoV-2 RNA is generally detectable in upper and lower respiratory specimens during the acute phase of infection. The lowest concentration of SARS-CoV-2 viral copies this assay can detect is 250 copies / mL. A negative result does not preclude SARS-CoV-2 infection and should not be used as the sole basis for treatment or other patient management decisions.  A negative result may occur with improper specimen collection / handling, submission of specimen other than nasopharyngeal swab, presence of viral mutation(s) within the areas targeted by this assay, and inadequate number of viral copies (<250 copies / mL). A negative result must be combined with clinical observations, patient history, and epidemiological information.  Fact Sheet for Patients:   BoilerBrush.com.cy  Fact Sheet for Healthcare Providers: https://pope.com/  This test is not yet approved or  cleared by the [place] FDA and has been authorized for detection and/or diagnosis of SARS-CoV-2 by FDA under an Emergency Use Authorization (EUA).  This EUA will remain in effect (meaning this test can be used) for the duration of the COVID-19 declaration under Section  564(b)(1) of the Act, 21 U.S.C. section 360bbb-3(b)(1), unless the authorization is terminated or revoked sooner.  Performed at [facility], [address] [place], [place] [address]       Radiology Studies: DG Chest 2 View  Result Date: 12/20/2019 CLINICAL DATA:  Fall EXAM: CHEST - 2 VIEW COMPARISON:  10/28/2019 FINDINGS: Cardiomegaly.  Lungs clear.  No effusions or acute bony abnormality. IMPRESSION: Cardiomegaly.  No active disease. Electronically Signed   By: Charlett Nose M.D.   On: 12/20/2019 18:09   DG Shoulder Right  Result Date: 12/20/2019 CLINICAL DATA:  Fall, shoulder pain EXAM: RIGHT SHOULDER - 2+ VIEW COMPARISON:  None. FINDINGS: Advanced arthritic changes in the right wrist. Complete loss of the subacromial space. No fracture or dislocation. IMPRESSION: No acute bony abnormality.  Advanced arthritic changes. Electronically Signed   By: Charlett Nose M.D.   On: 12/20/2019 18:08   DG Wrist Complete Right  Result Date: 12/20/2019 CLINICAL DATA:  Fall, wrist pain EXAM: RIGHT WRIST - COMPLETE 3+ VIEW COMPARISON:  None. FINDINGS: Advanced degenerative changes at the radiocarpal joint. No acute bony abnormality. Specifically, no fracture, subluxation, or dislocation. IMPRESSION: No acute bony abnormality. Electronically Signed   By: Charlett Nose M.D.   On: 12/20/2019 18:08   DG Knee 2 Views Left  Result Date: 12/20/2019 CLINICAL DATA:  Fall, left knee pain EXAM: LEFT KNEE - 1-2 VIEW COMPARISON:  None. FINDINGS: Moderate tricompartment degenerative changes with joint space narrowing and spurring. No acute bony abnormality. Specifically, no fracture, subluxation, or dislocation. No joint effusion. IMPRESSION: Moderate degenerative changes.  No acute bony abnormality. Electronically Signed   By: Charlett Nose M.D.   On: 12/20/2019 18:09   CT Head Wo Contrast  Result Date: 12/20/2019 CLINICAL DATA:  Fall last night, found down. EXAM: CT HEAD WITHOUT CONTRAST CT  MAXILLOFACIAL WITHOUT CONTRAST CT CERVICAL SPINE WITHOUT CONTRAST TECHNIQUE: Multidetector CT imaging of the head, cervical spine, and maxillofacial structures were performed using the standard protocol without intravenous contrast. Multiplanar CT image reconstructions of the cervical spine and maxillofacial structures were also generated. COMPARISON:  Head CT dated 07/10/2004. FINDINGS: CT HEAD FINDINGS Brain: There is generalized age related parenchymal volume loss with commensurate dilatation of the ventricles and sulci. Chronic small vessel ischemic changes are noted within the bilateral periventricular and subcortical white matter regions. No mass, hemorrhage, edema or other evidence of acute parenchymal abnormality. No extra-axial hemorrhage. Vascular: Chronic calcified atherosclerotic changes of the large vessels at the skull base. No unexpected hyperdense vessel. Skull: Normal. Negative for fracture or focal lesion. Other: None. CT MAXILLOFACIAL FINDINGS Osseous: Lower frontal bones are intact. No displaced nasal bone fracture. Osseous structures about the orbits appear intact and normally aligned bilaterally. Walls of the maxillary sinuses appear intact and normally aligned bilaterally. Bilateral zygomatic arches and pterygoid plates are intact. No mandible fracture or displacement is seen, although slight motion artifact limits characterization. Orbits: Negative. No traumatic or inflammatory finding. Sinuses: Mild mucosal thickening within the sphenoid sinus. No evidence of acute sinusitis. Soft tissues: Unremarkable.  No superficial soft tissue hematoma. CT CERVICAL SPINE FINDINGS Alignment: Levoscoliosis and straightening of the normal cervical spine lordosis, likely related to patient positioning and/or underlying degenerative change. No evidence of acute vertebral body subluxation. Skull base and vertebrae: No fracture line or displaced fracture fragment. Soft tissues and spinal canal: No prevertebral  fluid or swelling. No visible canal hematoma. Disc levels: Degenerative spondylosis, mild to moderate in degree. Associated disc-osteophytic bulges at the C3-4 through C6-7 levels causing mild to moderate central canal stenoses. Upper chest: Atherosclerosis of the thoracic aortic arch, incompletely imaged. No acute findings. Other: Carotid atherosclerosis. IMPRESSION: 1. No acute intracranial abnormality. No intracranial mass, hemorrhage or edema. No skull fracture. Chronic small vessel ischemic changes within the white matter. 2. No facial bone fracture or displacement. 3. No fracture or acute subluxation within the cervical spine. Degenerative spondylosis of the cervical spine, mild to moderate in degree, as detailed above. Aortic Atherosclerosis (ICD10-I70.0). Electronically Signed   By: Bary Richard M.D.   On: 12/20/2019 18:19   CT Cervical Spine Wo Contrast  Result Date: 12/20/2019 CLINICAL DATA:  Fall last night, found down. EXAM: CT HEAD WITHOUT CONTRAST CT MAXILLOFACIAL WITHOUT CONTRAST CT CERVICAL SPINE WITHOUT CONTRAST TECHNIQUE: Multidetector CT imaging of the head, cervical spine, and maxillofacial structures were performed using the standard protocol without intravenous contrast. Multiplanar CT image reconstructions of the cervical spine and maxillofacial structures were also generated. COMPARISON:  Head CT dated 07/10/2004. FINDINGS: CT HEAD FINDINGS Brain: There is generalized age related parenchymal volume loss with commensurate dilatation of the ventricles and sulci. Chronic small vessel ischemic changes are noted within the bilateral periventricular and subcortical white matter regions. No mass, hemorrhage, edema or other evidence of acute parenchymal abnormality. No extra-axial hemorrhage. Vascular: Chronic calcified atherosclerotic changes of the large vessels at the skull base. No unexpected hyperdense vessel. Skull: Normal. Negative for fracture or focal lesion. Other: None. CT  MAXILLOFACIAL FINDINGS Osseous: Lower frontal bones are intact. No displaced nasal bone fracture. Osseous structures about the orbits appear intact and normally aligned bilaterally. Walls of the maxillary sinuses appear intact and normally aligned bilaterally. Bilateral zygomatic arches and pterygoid plates are intact. No mandible fracture or displacement is seen, although slight motion artifact limits characterization. Orbits: Negative. No traumatic or inflammatory finding. Sinuses: Mild mucosal thickening within the sphenoid sinus. No evidence of acute sinusitis. Soft tissues: Unremarkable.  No superficial soft tissue hematoma. CT CERVICAL SPINE FINDINGS Alignment: Levoscoliosis and straightening of the normal cervical spine lordosis, likely related to patient positioning and/or underlying degenerative change. No evidence of acute vertebral body subluxation. Skull base and vertebrae: No fracture line or displaced fracture fragment. Soft tissues and spinal canal: No prevertebral fluid or swelling. No visible canal hematoma. Disc levels: Degenerative spondylosis, mild to moderate in degree. Associated disc-osteophytic bulges at the C3-4 through C6-7 levels causing mild to moderate central canal stenoses. Upper chest: Atherosclerosis of the thoracic aortic arch, incompletely imaged. No acute findings. Other: Carotid atherosclerosis. IMPRESSION: 1. No acute intracranial abnormality. No intracranial mass, hemorrhage or edema. No skull fracture. Chronic small vessel ischemic changes within the white matter. 2. No facial bone fracture or displacement. 3. No fracture or acute subluxation within the cervical spine. Degenerative spondylosis of the cervical spine, mild to moderate in degree, as detailed above. Aortic Atherosclerosis (ICD10-I70.0). Electronically Signed   By: Bary Richard M.D.   On: 12/20/2019 18:19   ECHOCARDIOGRAM COMPLETE  Result Date: 12/21/2019    ECHOCARDIOGRAM REPORT   Patient Name:   Carolyn Bishop  Date of Exam: 12/21/2019 Medical Rec #:  [id number]    Height:       63.0 in Accession #:    [id number]   Weight:       138.0 lb Date of Birth:  May 19, 1932    BSA:          1.652 m Patient Age:    87 years     BP:           164/69 mmHg Patient Gender: F            HR:           80 bpm. Exam Location:  Inpatient Procedure: 2D Echo, 3D Echo, Color Doppler and Cardiac Doppler Indications:    R55 Syncope  History:        Patient has prior history of Echocardiogram examinations, most                 recent 10/28/2017. Risk Factors:Hypertension, Diabetes and                 Dyslipidemia.  Sonographer:    Irving Burton Senior RDCS Referring Phys: [id number] RAVI PAHWANI IMPRESSIONS  1. Left ventricular ejection fraction, by estimation, is 60 to 65%. The left ventricle has normal function. The left ventricle has no regional wall motion abnormalities. There is mild left ventricular hypertrophy of the basal-septal segment. Left ventricular diastolic parameters are  consistent with Grade I diastolic dysfunction (impaired relaxation).  2. Right ventricular systolic function is normal. The right ventricular size is normal. There is normal pulmonary artery systolic pressure.  3. Left atrial size was mildly dilated.  4. The mitral valve is normal in structure. No evidence of mitral valve regurgitation. No evidence of mitral stenosis.  5. The aortic valve is normal in structure. Aortic valve regurgitation is not visualized. No aortic stenosis is present.  6. The inferior vena cava is normal in size with <50% respiratory variability, suggesting right atrial pressure of 8 mmHg. FINDINGS  Left Ventricle: Left ventricular ejection fraction, by estimation, is 60 to 65%. The left ventricle has normal function. The left ventricle has no regional wall motion abnormalities. The left ventricular internal cavity size was normal in size. There is  mild left ventricular hypertrophy of the basal-septal segment. Left ventricular diastolic parameters are  consistent with Grade I diastolic dysfunction (impaired relaxation). Indeterminate filling pressures. Right Ventricle: The right ventricular size is normal. No increase in right ventricular wall thickness. Right ventricular systolic function is normal. There is normal pulmonary artery systolic pressure. The tricuspid regurgitant velocity is 2.03 m/s, and  with an assumed right atrial pressure of 8 mmHg, the estimated right ventricular systolic pressure is 24.5 mmHg. Left Atrium: Left atrial size was mildly dilated. Right Atrium: Right atrial size was normal in size. Pericardium: There is no evidence of pericardial effusion. Mitral Valve: The mitral valve is normal in structure. Normal mobility of the mitral valve leaflets. No evidence of mitral valve regurgitation. No evidence of mitral valve stenosis. Tricuspid Valve: The tricuspid valve is normal in structure. Tricuspid valve regurgitation is not demonstrated. No evidence of tricuspid stenosis. Aortic Valve: The aortic valve is normal in structure. Aortic valve regurgitation is not visualized. No aortic stenosis is present. Pulmonic Valve: The pulmonic valve was normal in structure. Pulmonic valve regurgitation is not visualized. No evidence of pulmonic stenosis. Aorta: The aortic root is normal in size and structure. Venous: The inferior vena cava is normal in size with less than 50% respiratory variability, suggesting right atrial pressure of 8 mmHg. IAS/Shunts: No atrial level shunt detected by color flow Doppler.  LEFT VENTRICLE PLAX 2D LVIDd:         3.80 cm  Diastology LVIDs:         2.50 cm  LV e' lateral:   5.98 cm/s LV PW:         0.80 cm  LV E/e' lateral: 12.5 LV IVS:        1.40 cm  LV e' medial:    6.74 cm/s LVOT diam:     1.90 cm  LV E/e' medial:  11.1 LV SV:         54 LV SV Index:   33 LVOT Area:     2.84 cm                          3D Volume EF:                         3D EF:        62 %                         LV EDV:       98 ml  LV ESV:       37 ml                         LV SV:        61 ml RIGHT VENTRICLE RV S prime:     11.90 cm/s TAPSE (M-mode): 1.9 cm LEFT ATRIUM             Index       RIGHT ATRIUM           Index LA diam:        3.60 cm 2.18 cm/m  RA Area:     10.50 cm LA Vol (A2C):   50.5 ml 30.58 ml/m RA Volume:   23.60 ml  14.29 ml/m LA Vol (A4C):   51.3 ml 31.06 ml/m LA Biplane Vol: 54.0 ml 32.70 ml/m  AORTIC VALVE LVOT Vmax:   92.00 cm/s LVOT Vmean:  67.600 cm/s LVOT VTI:    0.191 m  AORTA Ao Root diam: 3.20 cm MITRAL VALVE                TRICUSPID VALVE MV Area (PHT): 3.03 cm     TR Peak grad:   16.5 mmHg MV Decel Time: 250 msec     TR Vmax:        203.00 cm/s MV E velocity: 75.00 cm/s MV A velocity: 125.00 cm/s  SHUNTS MV E/A ratio:  0.60         Systemic VTI:  0.19 m                             Systemic Diam: 1.90 cm Rachelle Hora Croitoru MD Electronically signed by Thurmon Fair MD Signature Date/Time: 12/21/2019/2:09:17 PM    Final    CT Maxillofacial WO CM  Result Date: 12/20/2019 CLINICAL DATA:  Fall last night, found down. EXAM: CT HEAD WITHOUT CONTRAST CT MAXILLOFACIAL WITHOUT CONTRAST CT CERVICAL SPINE WITHOUT CONTRAST TECHNIQUE: Multidetector CT imaging of the head, cervical spine, and maxillofacial structures were performed using the standard protocol without intravenous contrast. Multiplanar CT image reconstructions of the cervical spine and maxillofacial structures were also generated. COMPARISON:  Head CT dated 07/10/2004. FINDINGS: CT HEAD FINDINGS Brain: There is generalized age related parenchymal volume loss with commensurate dilatation of the ventricles and sulci. Chronic small vessel ischemic changes are noted within the bilateral periventricular and subcortical white matter regions. No mass, hemorrhage, edema or other evidence of acute parenchymal abnormality. No extra-axial hemorrhage. Vascular: Chronic calcified atherosclerotic changes of the large vessels at the skull base. No unexpected  hyperdense vessel. Skull: Normal. Negative for fracture or focal lesion. Other: None. CT MAXILLOFACIAL FINDINGS Osseous: Lower frontal bones are intact. No displaced nasal bone fracture. Osseous structures about the orbits appear intact and normally aligned bilaterally. Walls of the maxillary sinuses appear intact and normally aligned bilaterally. Bilateral zygomatic arches and pterygoid plates are intact. No mandible fracture or displacement is seen, although slight motion artifact limits characterization. Orbits: Negative. No traumatic or inflammatory finding. Sinuses: Mild mucosal thickening within the sphenoid sinus. No evidence of acute sinusitis. Soft tissues: Unremarkable.  No superficial soft tissue hematoma. CT CERVICAL SPINE FINDINGS Alignment: Levoscoliosis and straightening of the normal cervical spine lordosis, likely related to patient positioning and/or underlying degenerative change. No evidence of acute vertebral body subluxation. Skull base and vertebrae: No fracture line or displaced fracture fragment. Soft tissues and spinal canal: No prevertebral fluid  or swelling. No visible canal hematoma. Disc levels: Degenerative spondylosis, mild to moderate in degree. Associated disc-osteophytic bulges at the C3-4 through C6-7 levels causing mild to moderate central canal stenoses. Upper chest: Atherosclerosis of the thoracic aortic arch, incompletely imaged. No acute findings. Other: Carotid atherosclerosis. IMPRESSION: 1. No acute intracranial abnormality. No intracranial mass, hemorrhage or edema. No skull fracture. Chronic small vessel ischemic changes within the white matter. 2. No facial bone fracture or displacement. 3. No fracture or acute subluxation within the cervical spine. Degenerative spondylosis of the cervical spine, mild to moderate in degree, as detailed above. Aortic Atherosclerosis (ICD10-I70.0). Electronically Signed   By: Bary RichardStan  Maynard M.D.   On: 12/20/2019 18:19      Scheduled  Meds: . amLODipine  5 mg Oral Daily  . enoxaparin (LOVENOX) injection  40 mg Subcutaneous QHS  . insulin aspart  0-15 Units Subcutaneous TID WC  . insulin aspart  0-5 Units Subcutaneous QHS  . mouth rinse  15 mL Mouth Rinse BID  . [START ON 12/22/2019] pneumococcal 23 valent vaccine  0.5 mL Intramuscular Tomorrow-1000  . sodium chloride flush  3 mL Intravenous Q12H   Continuous Infusions: . sodium chloride 100 mL/hr at 12/21/19 0033  . cefTRIAXone (ROCEPHIN)  IV        Assessment/Plan:  1.  Recurrent falls: Patient feels chronic ataxia/balance issues leading to falls.  Abnormal UA noted but no fever, or white count or complaints of dysuria.  Will DC antibiotics and await urine cultures.  LVH but otherwise normal.  No report of syncopal events.  She denies any dizzy spells but will check orthostatics as she is on Norvasc.  Seen by PT/OT and recommending SNF.  2.  Mild CK elevation: In the setting of problem #1.  Receiving IV fluids, will repeat level. DC fluids if downtrending.  Encourage oral fluid intake.  Carries diagnosis of hyperlipidemia but not on statins.  3. Hypertension: Resumed on home medications and normotensive today.  4.  Diabetes mellitus type 2: Diet controlled.  Recent hemoglobin A1c at 6.3.  Continue sliding scale insulin.  Diabetic diet.  5.  Abnormal UA: Follow-up urine cultures.  Not very convincing for UTI.  Will DC antibiotics and monitor.  Afebrile.  No report of confusion so far.  Encephalopathy ruled out.  DVT prophylaxis: Lovenox Code Status: Full code Family / Patient Communication: Discussed with patient, attempted calling family member-Melody at listed number-no answer.  Disposition Plan:   Status is: Inpatient  Remains inpatient appropriate because:Unsafe d/c plan   Dispo: The patient is from: Home              Anticipated d/c is to: SNF              Anticipated d/c date is: 2 days              Patient currently is medically stable to  d/c.          Time spent: 25 minutes     >50% time spent in discussions with care team and coordination of care.    Alessandra BevelsNeelima Othelia Riederer, MD [facility] Pager in [employer]  If 7PM-7AM, please contact night-coverage [email or web address] 12/21/2019, 2:52 PM

## 2019-12-21 NOTE — Progress Notes (Signed)
Attempted to call report. ED busy at this time. Will try again. Priscille Kluver

## 2019-12-22 DIAGNOSIS — I10 Essential (primary) hypertension: Secondary | ICD-10-CM

## 2019-12-22 DIAGNOSIS — E440 Moderate protein-calorie malnutrition: Secondary | ICD-10-CM | POA: Insufficient documentation

## 2019-12-22 DIAGNOSIS — R410 Disorientation, unspecified: Secondary | ICD-10-CM

## 2019-12-22 LAB — GLUCOSE, CAPILLARY
Glucose-Capillary: 101 mg/dL — ABNORMAL HIGH (ref 70–99)
Glucose-Capillary: 121 mg/dL — ABNORMAL HIGH (ref 70–99)
Glucose-Capillary: 156 mg/dL — ABNORMAL HIGH (ref 70–99)
Glucose-Capillary: 205 mg/dL — ABNORMAL HIGH (ref 70–99)
Glucose-Capillary: 284 mg/dL — ABNORMAL HIGH (ref 70–99)
Glucose-Capillary: 84 mg/dL (ref 70–99)

## 2019-12-22 NOTE — Progress Notes (Signed)
[facility]                                                                              Patient Demographics  Carolyn Bishop, is a 86 y.o. female, DOB - 04-18-1933, ELF:[id number]  Admit date - 12/20/2019   Admitting Physician Hughie Closs, MD  Outpatient Primary MD for the patient is Patient, No Pcp Per  Outpatient specialists:   LOS - 2  days   Medical records reviewed and are as summarized below:    No chief complaint on file.      Brief summary    86 year old female with history of hyperlipidemia, hypertension, DM type II, frequent falls who was recently hospitalized from 6/20- 6/24 due to acute encephalopathy secondary to Bactrim resistant E .coli UTI and weakness -> was discharged to SNF-->Went home 3 weeks back-> back in hospital with recurrent falls, lives alone, niece next door and convinced her to come to ED.  ED Course: Afebrile,Slightly hypertensive. Most of the lab work was unremarkable with slightly elevated CK level at 365.  Potassium 3.7.Due to her having some bruises on the face, she underwent extensive imaging studies including CT maxillofacial, CT cervical spine, CT head, chest x-ray, left knee x-ray, right shoulder x-ray and right wrist x-ray and all of them were unremarkable. UA showed positive leukoesterase but no nitrites. She was presumed to have UTI.  Assessment & Plan    Recurrent falls:  -Patient feels chronic ataxia and balance issues leading to falls.   -Urine culture showed less than 10,000 colonies, antibiotics discontinued -PT OT recommended SNF.  Social work consult placed -will check orthostatic vitals -CT head showed no acute intracranial abnormality, no mass hemorrhage or edema, chronic small vessel ischemic changes  Mild CK elevation -CK 365 -In the setting of recurrent falls, patient was placed on IV fluid hydration -CK now improved  Hypertension -Continue amlodipine  Diabetes mellitus type 2, diet  controlled Recent A1c, 6.3, continue sliding scale insulin  Moderate manutrition Estimated body mass index is 24.45 kg/m as calculated from the following:   Height as of this encounter: 5\' 3"  (1.6 m).   Weight as of this encounter: 62.6 kg.  Code Status: Full code DVT Prophylaxis:  Lovenox  Family Communication: Discussed all imaging results, lab results, explained to the patient    Disposition Plan:     Status is: Inpatient  Remains inpatient appropriate because:Hemodynamically unstable and Inpatient level of care appropriate due to severity of illness   Dispo:  Patient From: Home  Planned Disposition: Skilled Nursing Facility  Expected discharge date: 12/24/19  Medically stable for discharge: Yes   Time Spent in minutes 25 minutes  Procedures:  None Consultants:   None  Antimicrobials:   Anti-infectives (From admission, onward)   Start     Dose/Rate Route Frequency Ordered Stop   12/21/19 2200  cefTRIAXone (ROCEPHIN) 1 g in sodium chloride 0.9 % 100 mL IVPB  Status:  Discontinued        1 g 200 mL/hr over 30 Minutes Intravenous Every 24 hours 12/20/19 2328 12/21/19 1508   12/20/19 2315  cefTRIAXone (ROCEPHIN) 1 g in sodium chloride  0.9 % 100 mL IVPB        1 g 200 mL/hr over 30 Minutes Intravenous  Once 12/20/19 2310 12/21/19 0108          Medications  Scheduled Meds: . amLODipine  5 mg Oral Daily  . enoxaparin (LOVENOX) injection  40 mg Subcutaneous QHS  . insulin aspart  0-15 Units Subcutaneous TID WC  . insulin aspart  0-5 Units Subcutaneous QHS  . mouth rinse  15 mL Mouth Rinse BID  . multivitamin with minerals  1 tablet Oral Daily  . Ensure Max Protein  11 oz Oral BID  . sodium chloride flush  3 mL Intravenous Q12H   Continuous Infusions: . sodium chloride 100 mL/hr at 12/22/19 0340   PRN Meds:.acetaminophen **OR** acetaminophen, hydrALAZINE, ondansetron **OR** ondansetron (ZOFRAN) IV      Subjective:   Carolyn Bishop was seen and examined  today.  No acute complaints.  Still feels weak, no nausea vomiting or diarrhea.  Patient denies dizziness, chest pain, shortness of breath, abdominal pain, , new weakness, numbess, tingling. No acute events overnight.  Afebrile.  Objective:   Vitals:   12/21/19 1045 12/21/19 1048 12/21/19 1400 12/22/19 0517  BP: (!) 132/56 (!) 164/69 (!) 142/68 (!) 146/58  Pulse: 82 84 86 63  Resp: Temp: 98.7 F (37.1 C)  98.6 F (37 C) 98.4 F (36.9 C)  TempSrc: Oral  Oral Oral  SpO2: 100% 100% 98% 97%  Weight:      Height:        Intake/Output Summary (Last 24 hours) at 12/22/2019 1346 Last data filed at 12/22/2019 1100 Gross per 24 hour  Intake 240 ml  Output 700 ml  Net -460 ml     Wt Readings from Last 3 Encounters:  12/20/19 62.6 kg  10/31/19 63.1 kg  11/07/17 72.6 kg     Exam  General: Alert and oriented x 3, NAD  Cardiovascular: S1 S2 auscultated, no murmurs, RRR  Respiratory: Clear to auscultation bilaterally, no wheezing, rales or rhonchi  Gastrointestinal: Soft, nontender, nondistended, + bowel sounds  Ext: no pedal edema bilaterally  Neuro: no new deficits  Musculoskeletal: No digital cyanosis, clubbing  Skin: Mild ecchymosis on the right side of face  Psych: Normal affect and demeanor, alert and oriented x3    Data Reviewed:  I have personally reviewed following labs and imaging studies  Micro Results Recent Results (from the past 240 hour(s))  SARS Coronavirus 2 by RT PCR (hospital order, performed in [facility] hospital lab) Nasopharyngeal Nasopharyngeal Swab     Status: None   Collection Time: 12/20/19  4:39 PM   Specimen: Nasopharyngeal Swab  Result Value Ref Range Status   SARS Coronavirus 2 NEGATIVE NEGATIVE Final    Comment: (NOTE) SARS-CoV-2 target nucleic acids are NOT DETECTED.  The SARS-CoV-2 RNA is generally detectable in upper and lower respiratory specimens during the acute phase of infection. The lowest concentration of  SARS-CoV-2 viral copies this assay can detect is 250 copies / mL. A negative result does not preclude SARS-CoV-2 infection and should not be used as the sole basis for treatment or other patient management decisions.  A negative result may occur with improper specimen collection / handling, submission of specimen other than nasopharyngeal swab, presence of viral mutation(s) within the areas targeted by this assay, and inadequate number of viral copies (<250 copies / mL). A negative result must be combined with clinical observations, patient history, and epidemiological information.  Fact Sheet for Patients:   BoilerBrush.com.cy  Fact Sheet for Healthcare Providers: https://pope.com/  This test is not yet approved or  cleared by the [place] FDA and has been authorized for detection and/or diagnosis of SARS-CoV-2 by FDA under an Emergency Use Authorization (EUA).  This EUA will remain in effect (meaning this test can be used) for the duration of the COVID-19 declaration under Section 564(b)(1) of the Act, 21 U.S.C. section 360bbb-3(b)(1), unless the authorization is terminated or revoked sooner.  Performed at [facility], [address] [place], [place] [address]   Urine culture     Status: Abnormal   Collection Time: 12/20/19  9:20 PM   Specimen: Urine, Random  Result Value Ref Range Status   Specimen Description   Final    URINE, RANDOM Performed at [facility], [address] [place], [place] [address]    Special Requests   Final    NONE Performed at [facility], [address], [place], [place] [address]    Culture (A)  Final    <10,000 COLONIES/mL INSIGNIFICANT GROWTH Performed at [facility], [address] [place], [place] [address]    Report Status 12/21/2019 FINAL  Final    Radiology Reports DG Chest 2 View  Result Date: 12/20/2019 CLINICAL  DATA:  Fall EXAM: CHEST - 2 VIEW COMPARISON:  10/28/2019 FINDINGS: Cardiomegaly.  Lungs clear.  No effusions or acute bony abnormality. IMPRESSION: Cardiomegaly.  No active disease. Electronically Signed   By: Charlett Nose M.D.   On: 12/20/2019 18:09   DG Shoulder Right  Result Date: 12/20/2019 CLINICAL DATA:  Fall, shoulder pain EXAM: RIGHT SHOULDER - 2+ VIEW COMPARISON:  None. FINDINGS: Advanced arthritic changes in the right wrist. Complete loss of the subacromial space. No fracture or dislocation. IMPRESSION: No acute bony abnormality.  Advanced arthritic changes. Electronically Signed   By: Charlett Nose M.D.   On: 12/20/2019 18:08   DG Wrist Complete Right  Result Date: 12/20/2019 CLINICAL DATA:  Fall, wrist pain EXAM: RIGHT WRIST - COMPLETE 3+ VIEW COMPARISON:  None. FINDINGS: Advanced degenerative changes at the radiocarpal joint. No acute bony abnormality. Specifically, no fracture, subluxation, or dislocation. IMPRESSION: No acute bony abnormality. Electronically Signed   By: Charlett Nose M.D.   On: 12/20/2019 18:08   DG Knee 2 Views Left  Result Date: 12/20/2019 CLINICAL DATA:  Fall, left knee pain EXAM: LEFT KNEE - 1-2 VIEW COMPARISON:  None. FINDINGS: Moderate tricompartment degenerative changes with joint space narrowing and spurring. No acute bony abnormality. Specifically, no fracture, subluxation, or dislocation. No joint effusion. IMPRESSION: Moderate degenerative changes.  No acute bony abnormality. Electronically Signed   By: Charlett Nose M.D.   On: 12/20/2019 18:09   CT Head Wo Contrast  Result Date: 12/20/2019 CLINICAL DATA:  Fall last night, found down. EXAM: CT HEAD WITHOUT CONTRAST CT MAXILLOFACIAL WITHOUT CONTRAST CT CERVICAL SPINE WITHOUT CONTRAST TECHNIQUE: Multidetector CT imaging of the head, cervical spine, and maxillofacial structures were performed using the standard protocol without intravenous contrast. Multiplanar CT image reconstructions of the cervical spine and  maxillofacial structures were also generated. COMPARISON:  Head CT dated 07/10/2004. FINDINGS: CT HEAD FINDINGS Brain: There is generalized age related parenchymal volume loss with commensurate dilatation of the ventricles and sulci. Chronic small vessel ischemic changes are noted within the bilateral periventricular and subcortical white matter regions. No mass, hemorrhage, edema or other evidence of acute parenchymal abnormality. No extra-axial hemorrhage. Vascular:  Chronic calcified atherosclerotic changes of the large vessels at the skull base. No unexpected hyperdense vessel. Skull: Normal. Negative for fracture or focal lesion. Other: None. CT MAXILLOFACIAL FINDINGS Osseous: Lower frontal bones are intact. No displaced nasal bone fracture. Osseous structures about the orbits appear intact and normally aligned bilaterally. Walls of the maxillary sinuses appear intact and normally aligned bilaterally. Bilateral zygomatic arches and pterygoid plates are intact. No mandible fracture or displacement is seen, although slight motion artifact limits characterization. Orbits: Negative. No traumatic or inflammatory finding. Sinuses: Mild mucosal thickening within the sphenoid sinus. No evidence of acute sinusitis. Soft tissues: Unremarkable.  No superficial soft tissue hematoma. CT CERVICAL SPINE FINDINGS Alignment: Levoscoliosis and straightening of the normal cervical spine lordosis, likely related to patient positioning and/or underlying degenerative change. No evidence of acute vertebral body subluxation. Skull base and vertebrae: No fracture line or displaced fracture fragment. Soft tissues and spinal canal: No prevertebral fluid or swelling. No visible canal hematoma. Disc levels: Degenerative spondylosis, mild to moderate in degree. Associated disc-osteophytic bulges at the C3-4 through C6-7 levels causing mild to moderate central canal stenoses. Upper chest: Atherosclerosis of the thoracic aortic arch,  incompletely imaged. No acute findings. Other: Carotid atherosclerosis. IMPRESSION: 1. No acute intracranial abnormality. No intracranial mass, hemorrhage or edema. No skull fracture. Chronic small vessel ischemic changes within the white matter. 2. No facial bone fracture or displacement. 3. No fracture or acute subluxation within the cervical spine. Degenerative spondylosis of the cervical spine, mild to moderate in degree, as detailed above. Aortic Atherosclerosis (ICD10-I70.0). Electronically Signed   By: Bary Richard M.D.   On: 12/20/2019 18:19   CT Cervical Spine Wo Contrast  Result Date: 12/20/2019 CLINICAL DATA:  Fall last night, found down. EXAM: CT HEAD WITHOUT CONTRAST CT MAXILLOFACIAL WITHOUT CONTRAST CT CERVICAL SPINE WITHOUT CONTRAST TECHNIQUE: Multidetector CT imaging of the head, cervical spine, and maxillofacial structures were performed using the standard protocol without intravenous contrast. Multiplanar CT image reconstructions of the cervical spine and maxillofacial structures were also generated. COMPARISON:  Head CT dated 07/10/2004. FINDINGS: CT HEAD FINDINGS Brain: There is generalized age related parenchymal volume loss with commensurate dilatation of the ventricles and sulci. Chronic small vessel ischemic changes are noted within the bilateral periventricular and subcortical white matter regions. No mass, hemorrhage, edema or other evidence of acute parenchymal abnormality. No extra-axial hemorrhage. Vascular: Chronic calcified atherosclerotic changes of the large vessels at the skull base. No unexpected hyperdense vessel. Skull: Normal. Negative for fracture or focal lesion. Other: None. CT MAXILLOFACIAL FINDINGS Osseous: Lower frontal bones are intact. No displaced nasal bone fracture. Osseous structures about the orbits appear intact and normally aligned bilaterally. Walls of the maxillary sinuses appear intact and normally aligned bilaterally. Bilateral zygomatic arches and  pterygoid plates are intact. No mandible fracture or displacement is seen, although slight motion artifact limits characterization. Orbits: Negative. No traumatic or inflammatory finding. Sinuses: Mild mucosal thickening within the sphenoid sinus. No evidence of acute sinusitis. Soft tissues: Unremarkable.  No superficial soft tissue hematoma. CT CERVICAL SPINE FINDINGS Alignment: Levoscoliosis and straightening of the normal cervical spine lordosis, likely related to patient positioning and/or underlying degenerative change. No evidence of acute vertebral body subluxation. Skull base and vertebrae: No fracture line or displaced fracture fragment. Soft tissues and spinal canal: No prevertebral fluid or swelling. No visible canal hematoma. Disc levels: Degenerative spondylosis, mild to moderate in degree. Associated disc-osteophytic bulges at the C3-4 through C6-7 levels causing mild to moderate central canal stenoses.  Upper chest: Atherosclerosis of the thoracic aortic arch, incompletely imaged. No acute findings. Other: Carotid atherosclerosis. IMPRESSION: 1. No acute intracranial abnormality. No intracranial mass, hemorrhage or edema. No skull fracture. Chronic small vessel ischemic changes within the white matter. 2. No facial bone fracture or displacement. 3. No fracture or acute subluxation within the cervical spine. Degenerative spondylosis of the cervical spine, mild to moderate in degree, as detailed above. Aortic Atherosclerosis (ICD10-I70.0). Electronically Signed   By: Bary RichardStan  Maynard M.D.   On: 12/20/2019 18:19   ECHOCARDIOGRAM COMPLETE  Result Date: 12/21/2019    ECHOCARDIOGRAM REPORT   Patient Name:   Carolyn FilbertRAMONA Esselman Date of Exam: 12/21/2019 Medical Rec #:  [id number]    Height:       63.0 in Accession #:    [id number]   Weight:       138.0 lb Date of Birth:  [birth year]    BSA:          1.652 m Patient Age:    87 years     BP:           164/69 mmHg Patient Gender: F            HR:           80 bpm. Exam  Location:  Inpatient Procedure: 2D Echo, 3D Echo, Color Doppler and Cardiac Doppler Indications:    R55 Syncope  History:        Patient has prior history of Echocardiogram examinations, most                 recent 10/28/2017. Risk Factors:Hypertension, Diabetes and                 Dyslipidemia.  Sonographer:    Irving BurtonEmily Senior RDCS Referring Phys: [id number] RAVI PAHWANI IMPRESSIONS  1. Left ventricular ejection fraction, by estimation, is 60 to 65%. The left ventricle has normal function. The left ventricle has no regional wall motion abnormalities. There is mild left ventricular hypertrophy of the basal-septal segment. Left ventricular diastolic parameters are consistent with Grade I diastolic dysfunction (impaired relaxation).  2. Right ventricular systolic function is normal. The right ventricular size is normal. There is normal pulmonary artery systolic pressure.  3. Left atrial size was mildly dilated.  4. The mitral valve is normal in structure. No evidence of mitral valve regurgitation. No evidence of mitral stenosis.  5. The aortic valve is normal in structure. Aortic valve regurgitation is not visualized. No aortic stenosis is present.  6. The inferior vena cava is normal in size with <50% respiratory variability, suggesting right atrial pressure of 8 mmHg. FINDINGS  Left Ventricle: Left ventricular ejection fraction, by estimation, is 60 to 65%. The left ventricle has normal function. The left ventricle has no regional wall motion abnormalities. The left ventricular internal cavity size was normal in size. There is  mild left ventricular hypertrophy of the basal-septal segment. Left ventricular diastolic parameters are consistent with Grade I diastolic dysfunction (impaired relaxation). Indeterminate filling pressures. Right Ventricle: The right ventricular size is normal. No increase in right ventricular wall thickness. Right ventricular systolic function is normal. There is normal pulmonary artery systolic  pressure. The tricuspid regurgitant velocity is 2.03 m/s, and  with an assumed right atrial pressure of 8 mmHg, the estimated right ventricular systolic pressure is 24.5 mmHg. Left Atrium: Left atrial size was mildly dilated. Right Atrium: Right atrial size was normal in size. Pericardium: There is no evidence of pericardial effusion. Mitral Valve: The mitral  valve is normal in structure. Normal mobility of the mitral valve leaflets. No evidence of mitral valve regurgitation. No evidence of mitral valve stenosis. Tricuspid Valve: The tricuspid valve is normal in structure. Tricuspid valve regurgitation is not demonstrated. No evidence of tricuspid stenosis. Aortic Valve: The aortic valve is normal in structure. Aortic valve regurgitation is not visualized. No aortic stenosis is present. Pulmonic Valve: The pulmonic valve was normal in structure. Pulmonic valve regurgitation is not visualized. No evidence of pulmonic stenosis. Aorta: The aortic root is normal in size and structure. Venous: The inferior vena cava is normal in size with less than 50% respiratory variability, suggesting right atrial pressure of 8 mmHg. IAS/Shunts: No atrial level shunt detected by color flow Doppler.  LEFT VENTRICLE PLAX 2D LVIDd:         3.80 cm  Diastology LVIDs:         2.50 cm  LV e' lateral:   5.98 cm/s LV PW:         0.80 cm  LV E/e' lateral: 12.5 LV IVS:        1.40 cm  LV e' medial:    6.74 cm/s LVOT diam:     1.90 cm  LV E/e' medial:  11.1 LV SV:         54 LV SV Index:   33 LVOT Area:     2.84 cm                          3D Volume EF:                         3D EF:        62 %                         LV EDV:       98 ml                         LV ESV:       37 ml                         LV SV:        61 ml RIGHT VENTRICLE RV S prime:     11.90 cm/s TAPSE (M-mode): 1.9 cm LEFT ATRIUM             Index       RIGHT ATRIUM           Index LA diam:        3.60 cm 2.18 cm/m  RA Area:     10.50 cm LA Vol (A2C):   50.5 ml 30.58  ml/m RA Volume:   23.60 ml  14.29 ml/m LA Vol (A4C):   51.3 ml 31.06 ml/m LA Biplane Vol: 54.0 ml 32.70 ml/m  AORTIC VALVE LVOT Vmax:   92.00 cm/s LVOT Vmean:  67.600 cm/s LVOT VTI:    0.191 m  AORTA Ao Root diam: 3.20 cm MITRAL VALVE                TRICUSPID VALVE MV Area (PHT): 3.03 cm     TR Peak grad:   16.5 mmHg MV Decel Time: 250 msec     TR Vmax:        203.00 cm/s MV E velocity: 75.00 cm/s MV  A velocity: 125.00 cm/s  SHUNTS MV E/A ratio:  0.60         Systemic VTI:  0.19 m                             Systemic Diam: 1.90 cm Thurmon Fair MD Electronically signed by Thurmon Fair MD Signature Date/Time: 12/21/2019/2:09:17 PM    Final    CT Maxillofacial WO CM  Result Date: 12/20/2019 CLINICAL DATA:  Fall last night, found down. EXAM: CT HEAD WITHOUT CONTRAST CT MAXILLOFACIAL WITHOUT CONTRAST CT CERVICAL SPINE WITHOUT CONTRAST TECHNIQUE: Multidetector CT imaging of the head, cervical spine, and maxillofacial structures were performed using the standard protocol without intravenous contrast. Multiplanar CT image reconstructions of the cervical spine and maxillofacial structures were also generated. COMPARISON:  Head CT dated 07/10/2004. FINDINGS: CT HEAD FINDINGS Brain: There is generalized age related parenchymal volume loss with commensurate dilatation of the ventricles and sulci. Chronic small vessel ischemic changes are noted within the bilateral periventricular and subcortical white matter regions. No mass, hemorrhage, edema or other evidence of acute parenchymal abnormality. No extra-axial hemorrhage. Vascular: Chronic calcified atherosclerotic changes of the large vessels at the skull base. No unexpected hyperdense vessel. Skull: Normal. Negative for fracture or focal lesion. Other: None. CT MAXILLOFACIAL FINDINGS Osseous: Lower frontal bones are intact. No displaced nasal bone fracture. Osseous structures about the orbits appear intact and normally aligned bilaterally. Walls of the maxillary  sinuses appear intact and normally aligned bilaterally. Bilateral zygomatic arches and pterygoid plates are intact. No mandible fracture or displacement is seen, although slight motion artifact limits characterization. Orbits: Negative. No traumatic or inflammatory finding. Sinuses: Mild mucosal thickening within the sphenoid sinus. No evidence of acute sinusitis. Soft tissues: Unremarkable.  No superficial soft tissue hematoma. CT CERVICAL SPINE FINDINGS Alignment: Levoscoliosis and straightening of the normal cervical spine lordosis, likely related to patient positioning and/or underlying degenerative change. No evidence of acute vertebral body subluxation. Skull base and vertebrae: No fracture line or displaced fracture fragment. Soft tissues and spinal canal: No prevertebral fluid or swelling. No visible canal hematoma. Disc levels: Degenerative spondylosis, mild to moderate in degree. Associated disc-osteophytic bulges at the C3-4 through C6-7 levels causing mild to moderate central canal stenoses. Upper chest: Atherosclerosis of the thoracic aortic arch, incompletely imaged. No acute findings. Other: Carotid atherosclerosis. IMPRESSION: 1. No acute intracranial abnormality. No intracranial mass, hemorrhage or edema. No skull fracture. Chronic small vessel ischemic changes within the white matter. 2. No facial bone fracture or displacement. 3. No fracture or acute subluxation within the cervical spine. Degenerative spondylosis of the cervical spine, mild to moderate in degree, as detailed above. Aortic Atherosclerosis (ICD10-I70.0). Electronically Signed   By: Bary Richard M.D.   On: 12/20/2019 18:19    Lab Data:  CBC: Recent Labs  Lab 12/20/19 1637 12/21/19 0031 12/21/19 0545  WBC 5.5 5.4 5.9  NEUTROABS 4.2  --   --   HGB 11.6* 11.1* 11.3*  HCT 35.8* 33.9* 35.2*  MCV 92.3 92.6 94.4  PLT 207 201 201   Basic Metabolic Panel: Recent Labs  Lab 12/20/19 1637 12/21/19 0031 12/21/19 0545  NA  144  --  144  K 3.7  --  3.4*  CL 109  --  108  CO2 25  --  23  GLUCOSE 148*  --  177*  BUN 19  --  16  CREATININE 0.67 0.69 0.63  CALCIUM 9.3  --  8.8*  MG  --  1.7  --    GFR: Estimated Creatinine Clearance: 42.5 mL/min (by C-G formula based on SCr of 0.63 mg/dL). Liver Function Tests: Recent Labs  Lab 12/20/19 1637  AST 28  ALT 15  ALKPHOS 60  BILITOT 1.0  PROT 6.7  ALBUMIN 3.4*   No results for input(s): LIPASE, AMYLASE in the last 168 hours. No results for input(s): AMMONIA in the last 168 hours. Coagulation Profile: No results for input(s): INR, PROTIME in the last 168 hours. Cardiac Enzymes: Recent Labs  Lab 12/20/19 1637 12/21/19 1653  CKTOTAL 365* 163   BNP (last 3 results) No results for input(s): PROBNP in the last 8760 hours. HbA1C: Recent Labs    12/21/19 0030  HGBA1C 6.3*   CBG: Recent Labs  Lab 12/21/19 2115 12/22/19 0002 12/22/19 0434 12/22/19 0721 12/22/19 1123  GLUCAP 111* 121* 101* 84 205*   Lipid Profile: No results for input(s): CHOL, HDL, LDLCALC, TRIG, CHOLHDL, LDLDIRECT in the last 72 hours. Thyroid Function Tests: Recent Labs    12/21/19 0030  TSH 1.061   Anemia Panel: No results for input(s): VITAMINB12, FOLATE, FERRITIN, TIBC, IRON, RETICCTPCT in the last 72 hours. Urine analysis:    Component Value Date/Time   COLORURINE YELLOW 12/20/2019 2120   APPEARANCEUR CLEAR 12/20/2019 2120   LABSPEC 1.018 12/20/2019 2120   PHURINE 5.0 12/20/2019 2120   GLUCOSEU NEGATIVE 12/20/2019 2120   HGBUR SMALL (A) 12/20/2019 2120   BILIRUBINUR NEGATIVE 12/20/2019 2120   KETONESUR 20 (A) 12/20/2019 2120   PROTEINUR 100 (A) 12/20/2019 2120   UROBILINOGEN 0.2 02/21/2015 2355   NITRITE NEGATIVE 12/20/2019 2120   LEUKOCYTESUR SMALL (A) 12/20/2019 2120     Yolinda Duerr M.D. [facility] 12/22/2019, 1:46 PM   Call night coverage person covering after 7pm

## 2019-12-23 ENCOUNTER — Inpatient Hospital Stay (HOSPITAL_COMMUNITY): Payer: Medicare HMO

## 2019-12-23 LAB — BASIC METABOLIC PANEL
Anion gap: 7 (ref 5–15)
BUN: 20 mg/dL (ref 8–23)
CO2: 25 mmol/L (ref 22–32)
Calcium: 9.1 mg/dL (ref 8.9–10.3)
Chloride: 109 mmol/L (ref 98–111)
Creatinine, Ser: 0.7 mg/dL (ref 0.44–1.00)
GFR calc Af Amer: >60 mL/min (ref >60)
GFR calc non Af Amer: >60 mL/min (ref >60)
Glucose, Bld: 123 mg/dL — ABNORMAL HIGH (ref 70–99)
Potassium: 3.8 mmol/L (ref 3.5–5.1)
Sodium: 141 mmol/L (ref 135–145)

## 2019-12-23 LAB — CBC
HCT: 34.3 % — ABNORMAL LOW (ref 36.0–46.0)
Hemoglobin: 11.2 g/dL — ABNORMAL LOW (ref 12.0–15.0)
MCH: 30.3 pg (ref 26.0–34.0)
MCHC: 32.7 g/dL (ref 30.0–36.0)
MCV: 92.7 fL (ref 80.0–100.0)
Platelets: 204 10*3/uL (ref 150–400)
RBC: 3.7 MIL/uL — ABNORMAL LOW (ref 3.87–5.11)
RDW: 13 % (ref 11.5–15.5)
WBC: 4.8 10*3/uL (ref 4.0–10.5)
nRBC: 0 % (ref 0.0–0.2)

## 2019-12-23 LAB — GLUCOSE, CAPILLARY
Glucose-Capillary: 115 mg/dL — ABNORMAL HIGH (ref 70–99)
Glucose-Capillary: 126 mg/dL — ABNORMAL HIGH (ref 70–99)
Glucose-Capillary: 129 mg/dL — ABNORMAL HIGH (ref 70–99)
Glucose-Capillary: 131 mg/dL — ABNORMAL HIGH (ref 70–99)
Glucose-Capillary: 156 mg/dL — ABNORMAL HIGH (ref 70–99)
Glucose-Capillary: 165 mg/dL — ABNORMAL HIGH (ref 70–99)

## 2019-12-23 MED ORDER — LORAZEPAM 2 MG/ML IJ SOLN
0.5000 mg | Freq: Once | INTRAMUSCULAR | Status: AC
  Administered 2019-12-23: 0.5 mg via INTRAVENOUS
  Filled 2019-12-23: qty 1

## 2019-12-23 NOTE — Progress Notes (Signed)
[facility]                                                                              Patient Demographics  Carolyn Bishop, is a 86 y.o. female, DOB - 1933/05/09, OEV:[id number]  Admit date - 12/20/2019   Admitting Physician Carolyn Closs, MD  Outpatient Primary MD for the patient is Carolyn Bishop  Outpatient specialists:   LOS - 3  days   Medical records reviewed and are as summarized below:    No chief complaint on file.      Brief summary    86 year old female with history of hyperlipidemia, hypertension, DM type II, frequent falls who was recently hospitalized from 6/20- 6/24 due to acute encephalopathy secondary to Bactrim resistant E .coli UTI and weakness -> was discharged to SNF-->Went home 3 weeks back-> back in hospital with recurrent falls, lives alone, niece next door and convinced her to come to ED.  ED Course: Afebrile,Slightly hypertensive. Most of the lab work was unremarkable with slightly elevated CK level at 365.  Potassium 3.7.Due to her having some bruises on the face, she underwent extensive imaging studies including CT maxillofacial, CT cervical spine, CT head, chest x-ray, left knee x-ray, right shoulder x-ray and right wrist x-ray and all of them were unremarkable. UA showed positive leukoesterase but no nitrites. She was presumed to have UTI.  Assessment & Plan    Recurrent falls:  -Patient feels chronic ataxia and balance issues leading to falls.   -Urine culture showed less than 10,000 colonies, antibiotics discontinued -PT OT recommended SNF.  Social work consult placed -CT head showed no acute intracranial abnormality, no mass hemorrhage or edema, chronic small vessel ischemic changes -Complaining of left hip pain today, will get left hip x-ray -Sitting up in the chair today eating breakfast, no dizziness or vertigo.  Has off-and-on cognitive deficits, with chronic visual issues.  Confirmed with patient's niece.  No acute  neurological deficits on exam.  Mild CK elevation -CK 365 -In the setting of recurrent falls, patient was placed on IV fluid hydration -CK now improved  Hypertension -Continue amlodipine, will increase to 10 mg daily if BP readings consistently elevated.  Diabetes mellitus type 2, diet controlled Recent A1c, 6.3, continue sliding scale insulin  Moderate manutrition Estimated body mass index is 24.45 kg/m as calculated from the following:   Height as of this encounter: 5\' 3"  (1.6 m).   Weight as of this encounter: 62.6 kg.  Code Status: Full code DVT Prophylaxis:  Lovenox  Family Communication: Discussed all imaging results, lab results, explained to the patient and patient's niece on the phone   Disposition Plan:     Status is: Inpatient  Remains inpatient appropriate because:Hemodynamically unstable and Inpatient level of care appropriate due to severity of illness   Dispo:  Patient From: Home  Planned Disposition: Skilled Nursing Facility  Expected discharge date: 12/24/19  Medically stable for discharge: Yes, hopefully DC to SNF in a.m. if bed available   Time Spent in minutes 25 minutes  Procedures:  None Consultants:   None  Antimicrobials:   Anti-infectives (From admission, onward)  Start     Dose/Rate Route Frequency Ordered Stop   12/21/19 2200  cefTRIAXone (ROCEPHIN) 1 g in sodium chloride 0.9 % 100 mL IVPB  Status:  Discontinued        1 g 200 mL/hr over 30 Minutes Intravenous Every 24 hours 12/20/19 2328 12/21/19 1508   12/20/19 2315  cefTRIAXone (ROCEPHIN) 1 g in sodium chloride 0.9 % 100 mL IVPB        1 g 200 mL/hr over 30 Minutes Intravenous  Once 12/20/19 2310 12/21/19 0108         Medications  Scheduled Meds: . amLODipine  5 mg Oral Daily  . enoxaparin (LOVENOX) injection  40 mg Subcutaneous QHS  . insulin aspart  0-15 Units Subcutaneous TID WC  . insulin aspart  0-5 Units Subcutaneous QHS  . mouth rinse  15 mL Mouth Rinse BID  .  multivitamin with minerals  1 tablet Oral Daily  . Ensure Max Protein  11 oz Oral BID  . sodium chloride flush  3 mL Intravenous Q12H   Continuous Infusions: . sodium chloride 100 mL/hr at 12/22/19 0340   PRN Meds:.acetaminophen **OR** acetaminophen, hydrALAZINE, ondansetron **OR** ondansetron (ZOFRAN) IV      Subjective:   Carolyn Bishop was seen and examined today.  Sitting up in the chair, eating breakfast.  Initially mildly confused, asking for her sister and where she was.  However, was still oriented to self,  time and person.  Subsequently stated that she was in [facility].  Bishop niece, patient has been having off-and-on cognitive deficits, chronic. No nausea vomiting or diarrhea.  No dizziness, chest pain or shortness of breath.    Objective:   Vitals:   12/22/19 1422 12/22/19 1530 12/22/19 2031 12/23/19 0437  BP: 138/62 (!) 166/70 (!) 128/54 (!) 160/62  Pulse: 76 90 79 79  Resp: Temp: 98.5 F (36.9 C)  98.5 F (36.9 C) 97.7 F (36.5 C)  TempSrc:   Oral Oral  SpO2: 95%  95% 94%  Weight:      Height:        Intake/Output Summary (Last 24 hours) at 12/23/2019 1135 Last data filed at 12/23/2019 1038 Gross Bishop 24 hour  Intake 603 ml  Output 700 ml  Net -97 ml     Wt Readings from Last 3 Encounters:  12/20/19 62.6 kg  10/31/19 63.1 kg  11/07/17 72.6 kg   Physical Exam  General: Alert and oriented x 3, NAD (mild cognitive deficit with disorientation to place at the beginning of exam however subsequently noted that she was in the hospital, stated [facility])  Cardiovascular: S1 S2 clear, RRR. No pedal edema b/l  Respiratory: CTAB, no wheezing, rales or rhonchi  Gastrointestinal: Soft, nontender, nondistended, NBS  Ext: no pedal edema bilaterally  Neuro: no new deficits  Musculoskeletal: No cyanosis, clubbing  Skin: No rashes  Psych: fairly alert and oriented, has some mild cognitive deficits   Data Reviewed:  I have personally reviewed  following labs and imaging studies  Micro Results Recent Results (from the past 240 hour(s))  SARS Coronavirus 2 by RT PCR (hospital order, performed in [facility] hospital lab) Nasopharyngeal Nasopharyngeal Swab     Status: None   Collection Time: 12/20/19  4:39 PM   Specimen: Nasopharyngeal Swab  Result Value Ref Range Status   SARS Coronavirus 2 NEGATIVE NEGATIVE Final    Comment: (NOTE) SARS-CoV-2 target nucleic acids are NOT DETECTED.  The SARS-CoV-2 RNA is  generally detectable in upper and lower respiratory specimens during the acute phase of infection. The lowest concentration of SARS-CoV-2 viral copies this assay can detect is 250 copies / mL. A negative result does not preclude SARS-CoV-2 infection and should not be used as the sole basis for treatment or other patient management decisions.  A negative result may occur with improper specimen collection / handling, submission of specimen other than nasopharyngeal swab, presence of viral mutation(s) within the areas targeted by this assay, and inadequate number of viral copies (<250 copies / mL). A negative result must be combined with clinical observations, patient history, and epidemiological information.  Fact Sheet for Patients:   BoilerBrush.com.cy  Fact Sheet for Healthcare Providers: https://pope.com/  This test is not yet approved or  cleared by the [place] FDA and has been authorized for detection and/or diagnosis of SARS-CoV-2 by FDA under an Emergency Use Authorization (EUA).  This EUA will remain in effect (meaning this test can be used) for the duration of the COVID-19 declaration under Section 564(b)(1) of the Act, 21 U.S.C. section 360bbb-3(b)(1), unless the authorization is terminated or revoked sooner.  Performed at [facility], [address] [place], [place] [address]   Urine culture     Status: Abnormal   Collection Time:  12/20/19  9:20 PM   Specimen: Urine, Random  Result Value Ref Range Status   Specimen Description   Final    URINE, RANDOM Performed at [facility], [address] [place], [place] [address]    Special Requests   Final    NONE Performed at [facility], [address], [place], [place] [address]    Culture (A)  Final    <10,000 COLONIES/mL INSIGNIFICANT GROWTH Performed at [facility], [address] [place], [place] [address]    Report Status 12/21/2019 FINAL  Final    Radiology Reports DG Chest 2 View  Result Date: 12/20/2019 CLINICAL DATA:  Fall EXAM: CHEST - 2 VIEW COMPARISON:  10/28/2019 FINDINGS: Cardiomegaly.  Lungs clear.  No effusions or acute bony abnormality. IMPRESSION: Cardiomegaly.  No active disease. Electronically Signed   By: Charlett Nose M.D.   On: 12/20/2019 18:09   DG Shoulder Right  Result Date: 12/20/2019 CLINICAL DATA:  Fall, shoulder pain EXAM: RIGHT SHOULDER - 2+ VIEW COMPARISON:  None. FINDINGS: Advanced arthritic changes in the right wrist. Complete loss of the subacromial space. No fracture or dislocation. IMPRESSION: No acute bony abnormality.  Advanced arthritic changes. Electronically Signed   By: Charlett Nose M.D.   On: 12/20/2019 18:08   DG Wrist Complete Right  Result Date: 12/20/2019 CLINICAL DATA:  Fall, wrist pain EXAM: RIGHT WRIST - COMPLETE 3+ VIEW COMPARISON:  None. FINDINGS: Advanced degenerative changes at the radiocarpal joint. No acute bony abnormality. Specifically, no fracture, subluxation, or dislocation. IMPRESSION: No acute bony abnormality. Electronically Signed   By: Charlett Nose M.D.   On: 12/20/2019 18:08   DG Knee 2 Views Left  Result Date: 12/20/2019 CLINICAL DATA:  Fall, left knee pain EXAM: LEFT KNEE - 1-2 VIEW COMPARISON:  None. FINDINGS: Moderate tricompartment degenerative changes with joint space narrowing and spurring. No acute bony abnormality. Specifically, no fracture,  subluxation, or dislocation. No joint effusion. IMPRESSION: Moderate degenerative changes.  No acute bony abnormality. Electronically Signed   By: Charlett Nose M.D.   On: 12/20/2019 18:09   CT Head Wo Contrast  Result Date: 12/20/2019 CLINICAL DATA:  Fall last night,  found down. EXAM: CT HEAD WITHOUT CONTRAST CT MAXILLOFACIAL WITHOUT CONTRAST CT CERVICAL SPINE WITHOUT CONTRAST TECHNIQUE: Multidetector CT imaging of the head, cervical spine, and maxillofacial structures were performed using the standard protocol without intravenous contrast. Multiplanar CT image reconstructions of the cervical spine and maxillofacial structures were also generated. COMPARISON:  Head CT dated 07/10/2004. FINDINGS: CT HEAD FINDINGS Brain: There is generalized age related parenchymal volume loss with commensurate dilatation of the ventricles and sulci. Chronic small vessel ischemic changes are noted within the bilateral periventricular and subcortical white matter regions. No mass, hemorrhage, edema or other evidence of acute parenchymal abnormality. No extra-axial hemorrhage. Vascular: Chronic calcified atherosclerotic changes of the large vessels at the skull base. No unexpected hyperdense vessel. Skull: Normal. Negative for fracture or focal lesion. Other: None. CT MAXILLOFACIAL FINDINGS Osseous: Lower frontal bones are intact. No displaced nasal bone fracture. Osseous structures about the orbits appear intact and normally aligned bilaterally. Walls of the maxillary sinuses appear intact and normally aligned bilaterally. Bilateral zygomatic arches and pterygoid plates are intact. No mandible fracture or displacement is seen, although slight motion artifact limits characterization. Orbits: Negative. No traumatic or inflammatory finding. Sinuses: Mild mucosal thickening within the sphenoid sinus. No evidence of acute sinusitis. Soft tissues: Unremarkable.  No superficial soft tissue hematoma. CT CERVICAL SPINE FINDINGS Alignment:  Levoscoliosis and straightening of the normal cervical spine lordosis, likely related to patient positioning and/or underlying degenerative change. No evidence of acute vertebral body subluxation. Skull base and vertebrae: No fracture line or displaced fracture fragment. Soft tissues and spinal canal: No prevertebral fluid or swelling. No visible canal hematoma. Disc levels: Degenerative spondylosis, mild to moderate in degree. Associated disc-osteophytic bulges at the C3-4 through C6-7 levels causing mild to moderate central canal stenoses. Upper chest: Atherosclerosis of the thoracic aortic arch, incompletely imaged. No acute findings. Other: Carotid atherosclerosis. IMPRESSION: 1. No acute intracranial abnormality. No intracranial mass, hemorrhage or edema. No skull fracture. Chronic small vessel ischemic changes within the white matter. 2. No facial bone fracture or displacement. 3. No fracture or acute subluxation within the cervical spine. Degenerative spondylosis of the cervical spine, mild to moderate in degree, as detailed above. Aortic Atherosclerosis (ICD10-I70.0). Electronically Signed   By: Bary RichardStan  Maynard M.D.   On: 12/20/2019 18:19   CT Cervical Spine Wo Contrast  Result Date: 12/20/2019 CLINICAL DATA:  Fall last night, found down. EXAM: CT HEAD WITHOUT CONTRAST CT MAXILLOFACIAL WITHOUT CONTRAST CT CERVICAL SPINE WITHOUT CONTRAST TECHNIQUE: Multidetector CT imaging of the head, cervical spine, and maxillofacial structures were performed using the standard protocol without intravenous contrast. Multiplanar CT image reconstructions of the cervical spine and maxillofacial structures were also generated. COMPARISON:  Head CT dated 07/10/2004. FINDINGS: CT HEAD FINDINGS Brain: There is generalized age related parenchymal volume loss with commensurate dilatation of the ventricles and sulci. Chronic small vessel ischemic changes are noted within the bilateral periventricular and subcortical white matter  regions. No mass, hemorrhage, edema or other evidence of acute parenchymal abnormality. No extra-axial hemorrhage. Vascular: Chronic calcified atherosclerotic changes of the large vessels at the skull base. No unexpected hyperdense vessel. Skull: Normal. Negative for fracture or focal lesion. Other: None. CT MAXILLOFACIAL FINDINGS Osseous: Lower frontal bones are intact. No displaced nasal bone fracture. Osseous structures about the orbits appear intact and normally aligned bilaterally. Walls of the maxillary sinuses appear intact and normally aligned bilaterally. Bilateral zygomatic arches and pterygoid plates are intact. No mandible fracture or displacement is seen, although slight motion artifact limits characterization.  Orbits: Negative. No traumatic or inflammatory finding. Sinuses: Mild mucosal thickening within the sphenoid sinus. No evidence of acute sinusitis. Soft tissues: Unremarkable.  No superficial soft tissue hematoma. CT CERVICAL SPINE FINDINGS Alignment: Levoscoliosis and straightening of the normal cervical spine lordosis, likely related to patient positioning and/or underlying degenerative change. No evidence of acute vertebral body subluxation. Skull base and vertebrae: No fracture line or displaced fracture fragment. Soft tissues and spinal canal: No prevertebral fluid or swelling. No visible canal hematoma. Disc levels: Degenerative spondylosis, mild to moderate in degree. Associated disc-osteophytic bulges at the C3-4 through C6-7 levels causing mild to moderate central canal stenoses. Upper chest: Atherosclerosis of the thoracic aortic arch, incompletely imaged. No acute findings. Other: Carotid atherosclerosis. IMPRESSION: 1. No acute intracranial abnormality. No intracranial mass, hemorrhage or edema. No skull fracture. Chronic small vessel ischemic changes within the white matter. 2. No facial bone fracture or displacement. 3. No fracture or acute subluxation within the cervical spine.  Degenerative spondylosis of the cervical spine, mild to moderate in degree, as detailed above. Aortic Atherosclerosis (ICD10-I70.0). Electronically Signed   By: Bary Richard M.D.   On: 12/20/2019 18:19   ECHOCARDIOGRAM COMPLETE  Result Date: 12/21/2019    ECHOCARDIOGRAM REPORT   Patient Name:   JOCHEBED BILLS Date of Exam: 12/21/2019 Medical Rec #:  [id number]    Height:       63.0 in Accession #:    [id number]   Weight:       138.0 lb Date of Birth:  Jul 23, 1932    BSA:          1.652 m Patient Age:    87 years     BP:           164/69 mmHg Patient Gender: F            HR:           80 bpm. Exam Location:  Inpatient Procedure: 2D Echo, 3D Echo, Color Doppler and Cardiac Doppler Indications:    R55 Syncope  History:        Patient has prior history of Echocardiogram examinations, most                 recent 10/28/2017. Risk Factors:Hypertension, Diabetes and                 Dyslipidemia.  Sonographer:    Irving Burton Senior RDCS Referring Phys: [id number] RAVI PAHWANI IMPRESSIONS  1. Left ventricular ejection fraction, by estimation, is 60 to 65%. The left ventricle has normal function. The left ventricle has no regional wall motion abnormalities. There is mild left ventricular hypertrophy of the basal-septal segment. Left ventricular diastolic parameters are consistent with Grade I diastolic dysfunction (impaired relaxation).  2. Right ventricular systolic function is normal. The right ventricular size is normal. There is normal pulmonary artery systolic pressure.  3. Left atrial size was mildly dilated.  4. The mitral valve is normal in structure. No evidence of mitral valve regurgitation. No evidence of mitral stenosis.  5. The aortic valve is normal in structure. Aortic valve regurgitation is not visualized. No aortic stenosis is present.  6. The inferior vena cava is normal in size with <50% respiratory variability, suggesting right atrial pressure of 8 mmHg. FINDINGS  Left Ventricle: Left ventricular ejection fraction,  by estimation, is 60 to 65%. The left ventricle has normal function. The left ventricle has no regional wall motion abnormalities. The left ventricular internal cavity size was normal in size. There is  mild left ventricular hypertrophy of the basal-septal segment. Left ventricular diastolic parameters are consistent with Grade I diastolic dysfunction (impaired relaxation). Indeterminate filling pressures. Right Ventricle: The right ventricular size is normal. No increase in right ventricular wall thickness. Right ventricular systolic function is normal. There is normal pulmonary artery systolic pressure. The tricuspid regurgitant velocity is 2.03 m/s, and  with an assumed right atrial pressure of 8 mmHg, the estimated right ventricular systolic pressure is 24.5 mmHg. Left Atrium: Left atrial size was mildly dilated. Right Atrium: Right atrial size was normal in size. Pericardium: There is no evidence of pericardial effusion. Mitral Valve: The mitral valve is normal in structure. Normal mobility of the mitral valve leaflets. No evidence of mitral valve regurgitation. No evidence of mitral valve stenosis. Tricuspid Valve: The tricuspid valve is normal in structure. Tricuspid valve regurgitation is not demonstrated. No evidence of tricuspid stenosis. Aortic Valve: The aortic valve is normal in structure. Aortic valve regurgitation is not visualized. No aortic stenosis is present. Pulmonic Valve: The pulmonic valve was normal in structure. Pulmonic valve regurgitation is not visualized. No evidence of pulmonic stenosis. Aorta: The aortic root is normal in size and structure. Venous: The inferior vena cava is normal in size with less than 50% respiratory variability, suggesting right atrial pressure of 8 mmHg. IAS/Shunts: No atrial level shunt detected by color flow Doppler.  LEFT VENTRICLE PLAX 2D LVIDd:         3.80 cm  Diastology LVIDs:         2.50 cm  LV e' lateral:   5.98 cm/s LV PW:         0.80 cm  LV E/e'  lateral: 12.5 LV IVS:        1.40 cm  LV e' medial:    6.74 cm/s LVOT diam:     1.90 cm  LV E/e' medial:  11.1 LV SV:         54 LV SV Index:   33 LVOT Area:     2.84 cm                          3D Volume EF:                         3D EF:        62 %                         LV EDV:       98 ml                         LV ESV:       37 ml                         LV SV:        61 ml RIGHT VENTRICLE RV S prime:     11.90 cm/s TAPSE (M-mode): 1.9 cm LEFT ATRIUM             Index       RIGHT ATRIUM           Index LA diam:        3.60 cm 2.18 cm/m  RA Area:     10.50 cm LA Vol (A2C):   50.5 ml 30.58 ml/m RA Volume:   23.60 ml  14.29 ml/m LA Vol (A4C):   51.3 ml 31.06 ml/m LA Biplane Vol: 54.0 ml 32.70 ml/m  AORTIC VALVE LVOT Vmax:   92.00 cm/s LVOT Vmean:  67.600 cm/s LVOT VTI:    0.191 m  AORTA Ao Root diam: 3.20 cm MITRAL VALVE                TRICUSPID VALVE MV Area (PHT): 3.03 cm     TR Peak grad:   16.5 mmHg MV Decel Time: 250 msec     TR Vmax:        203.00 cm/s MV E velocity: 75.00 cm/s MV A velocity: 125.00 cm/s  SHUNTS MV E/A ratio:  0.60         Systemic VTI:  0.19 m                             Systemic Diam: 1.90 cm Thurmon Fair MD Electronically signed by Thurmon Fair MD Signature Date/Time: 12/21/2019/2:09:17 PM    Final    CT Maxillofacial WO CM  Result Date: 12/20/2019 CLINICAL DATA:  Fall last night, found down. EXAM: CT HEAD WITHOUT CONTRAST CT MAXILLOFACIAL WITHOUT CONTRAST CT CERVICAL SPINE WITHOUT CONTRAST TECHNIQUE: Multidetector CT imaging of the head, cervical spine, and maxillofacial structures were performed using the standard protocol without intravenous contrast. Multiplanar CT image reconstructions of the cervical spine and maxillofacial structures were also generated. COMPARISON:  Head CT dated 07/10/2004. FINDINGS: CT HEAD FINDINGS Brain: There is generalized age related parenchymal volume loss with commensurate dilatation of the ventricles and sulci. Chronic small vessel  ischemic changes are noted within the bilateral periventricular and subcortical white matter regions. No mass, hemorrhage, edema or other evidence of acute parenchymal abnormality. No extra-axial hemorrhage. Vascular: Chronic calcified atherosclerotic changes of the large vessels at the skull base. No unexpected hyperdense vessel. Skull: Normal. Negative for fracture or focal lesion. Other: None. CT MAXILLOFACIAL FINDINGS Osseous: Lower frontal bones are intact. No displaced nasal bone fracture. Osseous structures about the orbits appear intact and normally aligned bilaterally. Walls of the maxillary sinuses appear intact and normally aligned bilaterally. Bilateral zygomatic arches and pterygoid plates are intact. No mandible fracture or displacement is seen, although slight motion artifact limits characterization. Orbits: Negative. No traumatic or inflammatory finding. Sinuses: Mild mucosal thickening within the sphenoid sinus. No evidence of acute sinusitis. Soft tissues: Unremarkable.  No superficial soft tissue hematoma. CT CERVICAL SPINE FINDINGS Alignment: Levoscoliosis and straightening of the normal cervical spine lordosis, likely related to patient positioning and/or underlying degenerative change. No evidence of acute vertebral body subluxation. Skull base and vertebrae: No fracture line or displaced fracture fragment. Soft tissues and spinal canal: No prevertebral fluid or swelling. No visible canal hematoma. Disc levels: Degenerative spondylosis, mild to moderate in degree. Associated disc-osteophytic bulges at the C3-4 through C6-7 levels causing mild to moderate central canal stenoses. Upper chest: Atherosclerosis of the thoracic aortic arch, incompletely imaged. No acute findings. Other: Carotid atherosclerosis. IMPRESSION: 1. No acute intracranial abnormality. No intracranial mass, hemorrhage or edema. No skull fracture. Chronic small vessel ischemic changes within the white matter. 2. No facial bone  fracture or displacement. 3. No fracture or acute subluxation within the cervical spine. Degenerative spondylosis of the cervical spine, mild to moderate in degree, as detailed above. Aortic Atherosclerosis (ICD10-I70.0). Electronically Signed   By: Bary Richard M.D.   On: 12/20/2019 18:19    Lab Data:  CBC: Recent Labs  Lab 12/20/19  1637 12/21/19 0031 12/21/19 0545 12/23/19 0558  WBC 5.5 5.4 5.9 4.8  NEUTROABS 4.2  --   --   --   HGB 11.6* 11.1* 11.3* 11.2*  HCT 35.8* 33.9* 35.2* 34.3*  MCV 92.3 92.6 94.4 92.7  PLT 207 201 201 204   Basic Metabolic Panel: Recent Labs  Lab 12/20/19 1637 12/21/19 0031 12/21/19 0545 12/23/19 0558  NA 144  --  144 141  K 3.7  --  3.4* 3.8  CL 109  --  108 109  CO2 25  --  23 25  GLUCOSE 148*  --  177* 123*  BUN 19  --  16 20  CREATININE 0.67 0.69 0.63 0.70  CALCIUM 9.3  --  8.8* 9.1  MG  --  1.7  --   --    GFR: Estimated Creatinine Clearance: 42.5 mL/min (by C-G formula based on SCr of 0.7 mg/dL). Liver Function Tests: Recent Labs  Lab 12/20/19 1637  AST 28  ALT 15  ALKPHOS 60  BILITOT 1.0  PROT 6.7  ALBUMIN 3.4*   No results for input(s): LIPASE, AMYLASE in the last 168 hours. No results for input(s): AMMONIA in the last 168 hours. Coagulation Profile: No results for input(s): INR, PROTIME in the last 168 hours. Cardiac Enzymes: Recent Labs  Lab 12/20/19 1637 12/21/19 1653  CKTOTAL 365* 163   BNP (last 3 results) No results for input(s): PROBNP in the last 8760 hours. HbA1C: Recent Labs    12/21/19 0030  HGBA1C 6.3*   CBG: Recent Labs  Lab 12/22/19 2038 12/23/19 0033 12/23/19 0439 12/23/19 0716 12/23/19 1130  GLUCAP 284* 129* 126* 131* 156*   Lipid Profile: No results for input(s): CHOL, HDL, LDLCALC, TRIG, CHOLHDL, LDLDIRECT in the last 72 hours. Thyroid Function Tests: Recent Labs    12/21/19 0030  TSH 1.061   Anemia Panel: No results for input(s): VITAMINB12, FOLATE, FERRITIN, TIBC, IRON,  RETICCTPCT in the last 72 hours. Urine analysis:    Component Value Date/Time   COLORURINE YELLOW 12/20/2019 2120   APPEARANCEUR CLEAR 12/20/2019 2120   LABSPEC 1.018 12/20/2019 2120   PHURINE 5.0 12/20/2019 2120   GLUCOSEU NEGATIVE 12/20/2019 2120   HGBUR SMALL (A) 12/20/2019 2120   BILIRUBINUR NEGATIVE 12/20/2019 2120   KETONESUR 20 (A) 12/20/2019 2120   PROTEINUR 100 (A) 12/20/2019 2120   UROBILINOGEN 0.2 02/21/2015 2355   NITRITE NEGATIVE 12/20/2019 2120   LEUKOCYTESUR SMALL (A) 12/20/2019 2120     Nimah Uphoff M.D. [facility] 12/23/2019, 11:35 AM   Call night coverage person covering after 7pm

## 2019-12-24 LAB — BASIC METABOLIC PANEL
Anion gap: 8 (ref 5–15)
BUN: 17 mg/dL (ref 8–23)
CO2: 26 mmol/L (ref 22–32)
Calcium: 9 mg/dL (ref 8.9–10.3)
Chloride: 106 mmol/L (ref 98–111)
Creatinine, Ser: 0.65 mg/dL (ref 0.44–1.00)
GFR calc Af Amer: >60 mL/min (ref >60)
GFR calc non Af Amer: >60 mL/min (ref >60)
Glucose, Bld: 84 mg/dL (ref 70–99)
Potassium: 4.1 mmol/L (ref 3.5–5.1)
Sodium: 140 mmol/L (ref 135–145)

## 2019-12-24 LAB — CBC
HCT: 30.3 % — ABNORMAL LOW (ref 36.0–46.0)
Hemoglobin: 9.7 g/dL — ABNORMAL LOW (ref 12.0–15.0)
MCH: 30.6 pg (ref 26.0–34.0)
MCHC: 32 g/dL (ref 30.0–36.0)
MCV: 95.6 fL (ref 80.0–100.0)
Platelets: 185 10*3/uL (ref 150–400)
RBC: 3.17 MIL/uL — ABNORMAL LOW (ref 3.87–5.11)
RDW: 13 % (ref 11.5–15.5)
WBC: 4.5 10*3/uL (ref 4.0–10.5)
nRBC: 0 % (ref 0.0–0.2)

## 2019-12-24 LAB — GLUCOSE, CAPILLARY
Glucose-Capillary: 106 mg/dL — ABNORMAL HIGH (ref 70–99)
Glucose-Capillary: 107 mg/dL — ABNORMAL HIGH (ref 70–99)
Glucose-Capillary: 146 mg/dL — ABNORMAL HIGH (ref 70–99)
Glucose-Capillary: 152 mg/dL — ABNORMAL HIGH (ref 70–99)
Glucose-Capillary: 73 mg/dL (ref 70–99)
Glucose-Capillary: 76 mg/dL (ref 70–99)

## 2019-12-24 LAB — SARS CORONAVIRUS 2 BY RT PCR (HOSPITAL ORDER, PERFORMED IN ~~LOC~~ HOSPITAL LAB): SARS Coronavirus 2: NEGATIVE

## 2019-12-24 NOTE — [place] FL2 (Signed)
[place] MEDICAID FL2 LEVEL OF CARE SCREENING TOOL     IDENTIFICATION  Patient Name: Verlee Pope Birthdate: 06-19-1932 Sex: female Admission Date (Current Location): 12/20/2019  [facility] and [place] Number:  Producer, television/film/video and Address:  [facility],  [address] [place] [address]      Provider Number: [id number]  Attending Physician Name and Address:  Cathren Harsh, MD  Relative Name and Phone Number:       Current Level of Care: Hospital Recommended Level of Care: Skilled Nursing Facility Prior Approval Number:    Date Approved/Denied:   PASRR Number:    Discharge Plan: SNF    Current Diagnoses: Patient Active Problem List   Diagnosis Date Noted  . Malnutrition of moderate degree 12/22/2019  . Fall at home 12/20/2019  . Syncope and collapse 12/20/2019  . Acute metabolic encephalopathy 12/20/2019  . Acute encephalopathy 10/28/2019  . Acute cystitis 10/28/2019  . Grade I diastolic dysfunction 10/28/2019  . HLD (hyperlipidemia) 10/28/2017  . Acute urinary retention   . AKI (acute kidney injury) (HCC) 10/27/2017  . Hypotension 10/27/2017  . Diabetes mellitus without complication (HCC) 10/27/2017  . Essential hypertension 10/27/2017  . Hypoglycemia 10/27/2017  . Normocytic anemia 10/27/2017  . GIB (gastrointestinal bleeding) 10/27/2017  . Lung mass 10/27/2017  . Lactic acid acidosis 10/27/2017  . Left arm weakness 10/27/2017    Orientation RESPIRATION BLADDER Height & Weight     Self  Normal Incontinent Weight: 62.6 kg Height:  5\' 3"  (160 cm)  BEHAVIORAL SYMPTOMS/MOOD NEUROLOGICAL BOWEL NUTRITION STATUS      Incontinent Diet (Heart healthy carb mod)  AMBULATORY STATUS COMMUNICATION OF NEEDS Skin   Extensive Assist Verbally Bruising                       Personal Care Assistance Level of Assistance  Bathing, Dressing Bathing Assistance: Maximum assistance   Dressing Assistance: Maximum assistance     Functional  Limitations Info  Sight, Hearing Sight Info: Impaired Hearing Info: Impaired      SPECIAL CARE FACTORS FREQUENCY  PT (By licensed PT), OT (By licensed OT)     PT Frequency: 5 x weekly OT Frequency: 5 x weekly            Contractures Contractures Info: Not present    Additional Factors Info  Code Status, Allergies Code Status Info: Full Allergies Info: None Known           Current Medications (12/24/2019):  This is the current hospital active medication list Current Facility-Administered Medications  Medication Dose Route Frequency Provider Last Rate Last Admin  . 0.9 %  sodium chloride infusion   Intravenous Continuous 12/26/2019, MD 100 mL/hr at 12/22/19 0340 New Bag at 12/22/19 0340  . acetaminophen (TYLENOL) tablet 650 mg  650 mg Oral Q6H PRN 12/24/19, MD   650 mg at 12/22/19 2215   Or  . acetaminophen (TYLENOL) suppository 650 mg  650 mg Rectal Q6H PRN 2216, MD      . amLODipine (NORVASC) tablet 5 mg  5 mg Oral Daily Hughie Closs, MD   5 mg at 12/24/19 0935  . enoxaparin (LOVENOX) injection 40 mg  40 mg Subcutaneous QHS 12/26/19, MD   40 mg at 12/23/19 2037  . hydrALAZINE (APRESOLINE) injection 10 mg  10 mg Intravenous Q4H PRN 2038, MD   10 mg at 12/21/19 0250  . insulin aspart (novoLOG) injection 0-15 Units  0-15  Units Subcutaneous TID WC Hughie Closs, MD   3 Units at 12/23/19 1736  . insulin aspart (novoLOG) injection 0-5 Units  0-5 Units Subcutaneous QHS Hughie Closs, MD   3 Units at 12/22/19 2107  . MEDLINE mouth rinse  15 mL Mouth Rinse BID Hughie Closs, MD   15 mL at 12/23/19 2039  . multivitamin with minerals tablet 1 tablet  1 tablet Oral Daily Alessandra Bevels, MD   1 tablet at 12/24/19 0935  . ondansetron (ZOFRAN) tablet 4 mg  4 mg Oral Q6H PRN Hughie Closs, MD       Or  . ondansetron (ZOFRAN) injection 4 mg  4 mg Intravenous Q6H PRN Pahwani, Ravi, MD      . protein supplement (ENSURE MAX) liquid  11 oz Oral BID Alessandra Bevels, MD   11 oz at 12/23/19 2039  . sodium chloride flush (NS) 0.9 % injection 3 mL  3 mL Intravenous Q12H Hughie Closs, MD   3 mL at 12/23/19 2040     Discharge Medications: Please see discharge summary for a list of discharge medications.  Relevant Imaging Results:  Relevant Lab Results:   Additional Information ss#[id number]  Gianina Olinde, Meriam Sprague, RN

## 2019-12-24 NOTE — Care Management Important Message (Signed)
Important Message  Patient Details IM Letter given to the Patient Name: Carolyn Bishop MRN: [id number] Date of Birth: [birth year]   Medicare Important Message Given:  Yes     [name] 12/24/2019, 9:41 AM

## 2019-12-24 NOTE — Progress Notes (Signed)
Physical Therapy Treatment Patient Details Name: Carolyn Bishop MRN: [id number] DOB: 1933-02-12 Today's Date: 12/24/2019    History of Present Illness 86 year old female with a history of hyperlipidemia, diabetes, HTN who presents after fall at home and found by family.    PT Comments    Pt requesting to go to bathroom upon arrival so therapist placed BSC at bedside. Pt able to come to sitting with min A to fully upright trunk, requiring cues for hand placement and sequencing. Attempted STS x3 attempts with total assist, but pt unable to coordinate hip/knee/trunk extension to rise up from EOB. Pt demonstrates fear of falling, maintaining posterior lean in sitting, reaching for BSC armrest, trying to move RW, reaching for therapist, with poor carryover when cued by therapist for hand placement and sequencing with transfers. Therapist assisted pt back to bed with mod A to elevate BLE back into bed. Pt unable to assist with straightening out in bed and requires max A to scoot up in bed once supine. RN notified of pt's need for bedpan and failed attempts at STS and transferring over to [facility]; pt left supine with call bell, rails up and bed alarm on. Patient will benefit from continued physical therapy in hospital and recommendations below to increase strength, balance, endurance for safe ADLs and gait.   Follow Up Recommendations  SNF     Equipment Recommendations  None recommended by PT    Recommendations for Other Services       Precautions / Restrictions Precautions Precautions: Fall Precaution Comments: very fearful of falling Restrictions Weight Bearing Restrictions: No    Mobility  Bed Mobility Overal bed mobility: Needs Assistance Bed Mobility: Supine to Sit;Sit to Supine  Supine to sit: Min assist Sit to supine: Mod assist   General bed mobility comments: slow, labored movement requiring increased time, min A to come to sitting at EOB with use of bedrail and heavy cues on  sequencing; mod A to return to supine for lifting BLE back into bed; max A to reposition in bed with constant cues but pt minimally assists with straightening up in bed  Transfers Overall transfer level: Needs assistance Equipment used: Rolling walker (2 wheeled) Transfers: Sit to/from Stand Sit to Stand: Total assist  General transfer comment: max A for 3 attempts from EOB, unable to clear bottom from bed, therapist blocking BLE from sliding forward; pt appears distracted trying to pull on [facility] handle, reposition RW with poor carryover when cued by therapist on sequencing and hand placement for trasnfer  Ambulation/Gait    General Gait Details: not attempted   Stairs             Wheelchair Mobility    Modified Rankin (Stroke Patients Only)       Balance Overall balance assessment: Needs assistance;History of Falls Sitting-balance support: Feet supported;Bilateral upper extremity supported Sitting balance-Leahy Scale: Poor Sitting balance - Comments: seated EOB, strong posterior lean despite cues for upright posture, min A to maintain upright sitting Postural control: Posterior lean       Cognition Arousal/Alertness: Awake/alert Behavior During Therapy: WFL for tasks assessed/performed Overall Cognitive Status: No family/caregiver present to determine baseline cognitive functioning  General Comments: Pt oriented to self, year and at hospital [facility] in [place].      Exercises      General Comments        Pertinent Vitals/Pain Pain Assessment: No/denies pain    Home Living  Prior Function            PT Goals (current goals can now be found in the care plan section) Acute Rehab PT Goals Patient Stated Goal: "I don't want to fall" PT Goal Formulation: With patient Time For Goal Achievement: 01/04/20 Potential to Achieve Goals: Fair Progress towards PT goals: Progressing toward goals    Frequency    Min  2X/week      PT Plan Current plan remains appropriate    Co-evaluation              AM-PAC PT "6 Clicks" Mobility   Outcome Measure  Help needed turning from your back to your side while in a flat bed without using bedrails?: A Little Help needed moving from lying on your back to sitting on the side of a flat bed without using bedrails?: A Little Help needed moving to and from a bed to a chair (including a wheelchair)?: Total Help needed standing up from a chair using your arms (e.g., wheelchair or bedside chair)?: Total Help needed to walk in hospital room?: Total Help needed climbing 3-5 steps with a railing? : Total 6 Click Score: 10    End of Session Equipment Utilized During Treatment: Gait belt Activity Tolerance: Patient limited by fatigue;Other (comment) (limited by fear of falling) Patient left: in bed;with call bell/phone within reach;with bed alarm set Nurse Communication: Mobility status;Other (comment) (needs bedpan) PT Visit Diagnosis: Unsteadiness on feet (R26.81);Other abnormalities of gait and mobility (R26.89);Repeated falls (R29.6);Muscle weakness (generalized) (M62.81);History of falling (Z91.81)     Time: 6761-9509 PT Time Calculation (min) (ACUTE ONLY): 13 min  Charges:  $Therapeutic Activity: 8-22 mins                      Tori Armanie Martine PT, DPT 12/24/19, 11:57 AM

## 2019-12-24 NOTE — TOC Initial Note (Addendum)
Transition of Care [facility]) - Initial/Assessment Note    Patient Details  Name: Carolyn Bishop MRN: [id number] Date of Birth: [birth year]  Transition of Care [facility]) CM/SW Contact:    Lynnell Catalan, RN Phone Number: 12/24/2019, 3:42 PM  Clinical Narrative:                 This CM met with pt at bedside for dc planning. Pt defers dc planning to her niece Melody. This CM contacted Melody via phone and went over physical therapy recommendations with her. Melody states that pt was at [facility] recently but that she did not want her going back there. Permission was given to fax out FL2. FL2 faxed out.  Pt with one bed offer of [facility] and this bed was accepted by Melody. [facility] alerted of bed acceptance and auth started with [facility]. Covid test ordered for DC to SNF tomorrow. Passr # [phone] [id number]  Expected Discharge Plan: Skilled Nursing Facility Barriers to Discharge: Insurance Authorization   Patient Goals and CMS Choice Patient states their goals for this hospitalization and ongoing recovery are:: To get better      Expected Discharge Plan and Services Expected Discharge Plan: [place]   Discharge Planning Services: CM Consult   Living arrangements for the past 2 months: Single Family Home                  Prior Living Arrangements/Services Living arrangements for the past 2 months: Single Family Home Lives with:: Siblings Patient language and need for interpreter reviewed:: Yes        Need for Family Participation in Patient Care: Yes (Comment) Care giver support system in place?: Yes (comment)   Criminal Activity/Legal Involvement Pertinent to Current Situation/Hospitalization: No - Comment as needed  Activities of Daily Living Home Assistive Devices/Equipment: Bedside commode/3-in-1, Blood pressure cuff, Hospital bed, Walker (specify type), Wheelchair ADL Screening (condition at time of admission) Patient's cognitive ability  adequate to safely complete daily activities?: No (forgetful at times. ) Is the patient deaf or have difficulty hearing?: Yes Does the patient have difficulty seeing, even when wearing glasses/contacts?: Yes Does the patient have difficulty concentrating, remembering, or making decisions?: Yes Patient able to express need for assistance with ADLs?: No (often refuses care that is needed at home per notes.) Does the patient have difficulty dressing or bathing?: Yes Independently performs ADLs?: No Communication: Independent Dressing (OT): Needs assistance Is this a change from baseline?: Pre-admission baseline (has been independent at home,but reports that she needs help) Grooming: Needs assistance Is this a change from baseline?: Pre-admission baseline (has been independent at home,but reports she needs help) Feeding: Independent Bathing: Needs assistance (has been independent, but admitting she needs help. ) Is this a change from baseline?: Pre-admission baseline Toileting: Needs assistance Is this a change from baseline?: Pre-admission baseline (Was independent PTA; but admitting she needs help. ) In/Out Bed: Needs assistance Is this a change from baseline?: Pre-admission baseline (reports independent PTA, but admitting she needs help. ) Walks in Home: Needs assistance Is this a change from baseline?: Pre-admission baseline (uses wheelchair at home; cant stand anymore. Needs help. ) Does the patient have difficulty walking or climbing stairs?: Yes Weakness of Legs: Both Weakness of Arms/Hands: Both  Permission Sought/Granted   Permission granted to share information with : Yes, Verbal Permission Granted              Emotional Assessment Appearance:: Appears stated age  Orientation: : Oriented to Self, Oriented to Place, Oriented to  Time, Oriented to Situation      Admission diagnosis:  Confusion [R41.0] Heart block AV second degree [I44.1] Fall at home [phone],  B40.370] Urinary tract infection without hematuria, site unspecified [N39.0] Syncope, unspecified syncope type [R55] Patient Active Problem List   Diagnosis Date Noted  . Malnutrition of moderate degree 12/22/2019  . Fall at home 12/20/2019  . Syncope and collapse 12/20/2019  . Acute metabolic encephalopathy 96/43/8381  . Acute encephalopathy 10/28/2019  . Acute cystitis 10/28/2019  . Grade I diastolic dysfunction 84/07/7541  . HLD (hyperlipidemia) 10/28/2017  . Acute urinary retention   . AKI (acute kidney injury) ([place]) 10/27/2017  . Hypotension 10/27/2017  . Diabetes mellitus without complication ([place]) 60/67/7034  . Essential hypertension 10/27/2017  . Hypoglycemia 10/27/2017  . Normocytic anemia 10/27/2017  . GIB (gastrointestinal bleeding) 10/27/2017  . Lung mass 10/27/2017  . Lactic acid acidosis 10/27/2017  . Left arm weakness 10/27/2017   PCP:  Patient, No Pcp Per Pharmacy:   [facility] [address] [place] [address] Phone: [phone] Fax: [phone]  [facility] [phone] [place], [place] [address] [place] [address] Phone: [phone] Fax: [phone]     Social Determinants of Health (SDOH) Interventions    Readmission Risk Interventions No flowsheet data found.

## 2019-12-24 NOTE — Progress Notes (Signed)
PT Cancellation Note  Patient Details Name: Carolyn Bishop MRN: [id number] DOB: 11/11/1932   Cancelled Treatment:    Reason Eval/Treat Not Completed: Fatigue/lethargy limiting ability to participate. Upon arrival, pt sleepy and RN assisting with breakfast tray; RN reports pt very tired this morning and may want to check back later this afternoon. Will check back as schedule permits.    Domenick Bookbinder PT, DPT 12/24/19, 9:44 AM

## 2019-12-24 NOTE — Progress Notes (Signed)
[facility]                                                                              Patient Demographics  Carolyn Bishop, is a 86 y.o. female, DOB - 1933/04/22, XNA:[id number]  Admit date - 12/20/2019   Admitting Physician Hughie Closs, MD  Outpatient Primary MD for the patient is Patient, No Pcp Per  Outpatient specialists:   LOS - 4  days   Medical records reviewed and are as summarized below:    No chief complaint on file.      Brief summary    86 year old female with history of hyperlipidemia, hypertension, DM type II, frequent falls who was recently hospitalized from 6/20- 6/24 due to acute encephalopathy secondary to Bactrim resistant E .coli UTI and weakness -> was discharged to SNF-->Went home 3 weeks back-> back in hospital with recurrent falls, lives alone, niece next door and convinced her to come to ED.  ED Course: Afebrile,Slightly hypertensive. Most of the lab work was unremarkable with slightly elevated CK level at 365.  Potassium 3.7.Due to her having some bruises on the face, she underwent extensive imaging studies including CT maxillofacial, CT cervical spine, CT head, chest x-ray, left knee x-ray, right shoulder x-ray and right wrist x-ray and all of them were unremarkable. UA showed positive leukoesterase but no nitrites. She was presumed to have UTI.  Assessment & Plan    Recurrent falls:  -Patient feels chronic ataxia and balance issues leading to falls.   -Urine culture showed less than 10,000 colonies, antibiotics discontinued -CT head showed no acute intracranial abnormality, no mass hemorrhage or edema, chronic small vessel ischemic changes -Left hip x-ray showed no acute osseous injury of the left hip.  On exam, no pain - Has off-and-on cognitive deficits, with chronic visual issues.  Confirmed with patient's niece.  No acute neurological deficits on exam. -PT recommending skilled nursing facility, social work assisting  Mild  CK elevation -CK 365 -In the setting of recurrent falls, patient was placed on IV fluid hydration -CK now improved  Hypertension -Continue amlodipine   Diabetes mellitus type 2, diet controlled Recent A1c, 6.3, continue sliding scale insulin  Moderate manutrition Estimated body mass index is 24.45 kg/m as calculated from the following:   Height as of this encounter: 5\' 3"  (1.6 m).   Weight as of this encounter: 62.6 kg.  Code Status: Full code DVT Prophylaxis:  Lovenox  Family Communication: Discussed all imaging results, lab results, explained to the patient and patient's niece on the phone on 8/15   Disposition Plan:     Status is: Inpatient  Remains inpatient appropriate because:Hemodynamically unstable and Inpatient level of care appropriate due to severity of illness   Dispo:  Patient From: Home  Planned Disposition: Skilled Nursing Facility  Expected discharge date: 12/24/19  Medically stable for discharge: Yes, awaiting skilled nursing facility   Time Spent in minutes 25 minutes  Procedures:  None Consultants:   None  Antimicrobials:   Anti-infectives (From admission, onward)   Start     Dose/Rate Route Frequency Ordered Stop   12/21/19 2200  cefTRIAXone (ROCEPHIN) 1 g in  sodium chloride 0.9 % 100 mL IVPB  Status:  Discontinued        1 g 200 mL/hr over 30 Minutes Intravenous Every 24 hours 12/20/19 2328 12/21/19 1508   12/20/19 2315  cefTRIAXone (ROCEPHIN) 1 g in sodium chloride 0.9 % 100 mL IVPB        1 g 200 mL/hr over 30 Minutes Intravenous  Once 12/20/19 2310 12/21/19 0108         Medications  Scheduled Meds: . amLODipine  5 mg Oral Daily  . enoxaparin (LOVENOX) injection  40 mg Subcutaneous QHS  . insulin aspart  0-15 Units Subcutaneous TID WC  . insulin aspart  0-5 Units Subcutaneous QHS  . mouth rinse  15 mL Mouth Rinse BID  . multivitamin with minerals  1 tablet Oral Daily  . Ensure Max Protein  11 oz Oral BID  . sodium chloride  flush  3 mL Intravenous Q12H   Continuous Infusions: . sodium chloride 100 mL/hr at 12/22/19 0340   PRN Meds:.acetaminophen **OR** acetaminophen, hydrALAZINE, ondansetron **OR** ondansetron (ZOFRAN) IV      Subjective:   Carolyn Bishop was seen and examined today.  No acute complaints.  No acute issues overnight.  No fevers, no active nausea vomiting or diarrhea.  No dizziness.   Objective:   Vitals:   12/22/19 2031 12/23/19 0437 12/23/19 1432 12/23/19 2030  BP: (!) 128/54 (!) 160/62 117/60 135/62  Pulse: 79 79 79 77  Resp: 19 16 14 16   Temp: 98.5 F (36.9 C) 97.7 F (36.5 C) 98.6 F (37 C) 98.2 F (36.8 C)  TempSrc: Oral Oral Oral Oral  SpO2: 95% 94% 97% 96%  Weight:      Height:        Intake/Output Summary (Last 24 hours) at 12/24/2019 1253 Last data filed at 12/24/2019 1011 Gross per 24 hour  Intake 480 ml  Output 1100 ml  Net -620 ml     Wt Readings from Last 3 Encounters:  12/20/19 62.6 kg  10/31/19 63.1 kg  11/07/17 72.6 kg   Physical Exam  General: Alert and oriented x self and time (mild cognitive deficits, chronic, waxing and waning)  Cardiovascular: S1 S2 clear, RRR. No pedal edema b/l  Respiratory: CTAB, no wheezing, rales or rhonchi  Gastrointestinal: Soft, nontender, nondistended, NBS  Ext: no pedal edema bilaterally  Neuro: no new deficits  Musculoskeletal: No cyanosis, clubbing  Skin: No rashes  Psych: mild cognitive deficits    Data Reviewed:  I have personally reviewed following labs and imaging studies  Micro Results Recent Results (from the past 240 hour(s))  SARS Coronavirus 2 by RT PCR (hospital order, performed in [facility] hospital lab) Nasopharyngeal Nasopharyngeal Swab     Status: None   Collection Time: 12/20/19  4:39 PM   Specimen: Nasopharyngeal Swab  Result Value Ref Range Status   SARS Coronavirus 2 NEGATIVE NEGATIVE Final    Comment: (NOTE) SARS-CoV-2 target nucleic acids are NOT DETECTED.  The SARS-CoV-2  RNA is generally detectable in upper and lower respiratory specimens during the acute phase of infection. The lowest concentration of SARS-CoV-2 viral copies this assay can detect is 250 copies / mL. A negative result does not preclude SARS-CoV-2 infection and should not be used as the sole basis for treatment or other patient management decisions.  A negative result may occur with improper specimen collection / handling, submission of specimen other than nasopharyngeal swab, presence of viral mutation(s) within the areas targeted by this assay,  and inadequate number of viral copies (<250 copies / mL). A negative result must be combined with clinical observations, patient history, and epidemiological information.  Fact Sheet for Patients:   BoilerBrush.com.cy  Fact Sheet for Healthcare Providers: https://pope.com/  This test is not yet approved or  cleared by the [place] FDA and has been authorized for detection and/or diagnosis of SARS-CoV-2 by FDA under an Emergency Use Authorization (EUA).  This EUA will remain in effect (meaning this test can be used) for the duration of the COVID-19 declaration under Section 564(b)(1) of the Act, 21 U.S.C. section 360bbb-3(b)(1), unless the authorization is terminated or revoked sooner.  Performed at [facility], [address], [place], [place] [address]   Urine culture     Status: Abnormal   Collection Time: 12/20/19  9:20 PM   Specimen: Urine, Random  Result Value Ref Range Status   Specimen Description   Final    URINE, RANDOM Performed at [facility], [address] [place], [place] [address]    Special Requests   Final    NONE Performed at [facility], [address], [place], [place] [address]    Culture (A)  Final    <10,000 COLONIES/mL INSIGNIFICANT GROWTH Performed at [facility], [address],  [place], [place] [address]    Report Status 12/21/2019 FINAL  Final    Radiology Reports DG Chest 2 View  Result Date: 12/20/2019 CLINICAL DATA:  Fall EXAM: CHEST - 2 VIEW COMPARISON:  10/28/2019 FINDINGS: Cardiomegaly.  Lungs clear.  No effusions or acute bony abnormality. IMPRESSION: Cardiomegaly.  No active disease. Electronically Signed   By: Charlett Nose M.D.   On: 12/20/2019 18:09   DG Shoulder Right  Result Date: 12/20/2019 CLINICAL DATA:  Fall, shoulder pain EXAM: RIGHT SHOULDER - 2+ VIEW COMPARISON:  None. FINDINGS: Advanced arthritic changes in the right wrist. Complete loss of the subacromial space. No fracture or dislocation. IMPRESSION: No acute bony abnormality.  Advanced arthritic changes. Electronically Signed   By: Charlett Nose M.D.   On: 12/20/2019 18:08   DG Wrist Complete Right  Result Date: 12/20/2019 CLINICAL DATA:  Fall, wrist pain EXAM: RIGHT WRIST - COMPLETE 3+ VIEW COMPARISON:  None. FINDINGS: Advanced degenerative changes at the radiocarpal joint. No acute bony abnormality. Specifically, no fracture, subluxation, or dislocation. IMPRESSION: No acute bony abnormality. Electronically Signed   By: Charlett Nose M.D.   On: 12/20/2019 18:08   DG Knee 2 Views Left  Result Date: 12/20/2019 CLINICAL DATA:  Fall, left knee pain EXAM: LEFT KNEE - 1-2 VIEW COMPARISON:  None. FINDINGS: Moderate tricompartment degenerative changes with joint space narrowing and spurring. No acute bony abnormality. Specifically, no fracture, subluxation, or dislocation. No joint effusion. IMPRESSION: Moderate degenerative changes.  No acute bony abnormality. Electronically Signed   By: Charlett Nose M.D.   On: 12/20/2019 18:09   CT Head Wo Contrast  Result Date: 12/20/2019 CLINICAL DATA:  Fall last night, found down. EXAM: CT HEAD WITHOUT CONTRAST CT MAXILLOFACIAL WITHOUT CONTRAST CT CERVICAL SPINE WITHOUT CONTRAST TECHNIQUE: Multidetector CT imaging of the head, cervical spine, and maxillofacial  structures were performed using the standard protocol without intravenous contrast. Multiplanar CT image reconstructions of the cervical spine and maxillofacial structures were also generated. COMPARISON:  Head CT dated 07/10/2004. FINDINGS: CT HEAD FINDINGS Brain: There is generalized age related parenchymal volume loss with commensurate dilatation of the ventricles and sulci. Chronic small vessel ischemic changes are  noted within the bilateral periventricular and subcortical white matter regions. No mass, hemorrhage, edema or other evidence of acute parenchymal abnormality. No extra-axial hemorrhage. Vascular: Chronic calcified atherosclerotic changes of the large vessels at the skull base. No unexpected hyperdense vessel. Skull: Normal. Negative for fracture or focal lesion. Other: None. CT MAXILLOFACIAL FINDINGS Osseous: Lower frontal bones are intact. No displaced nasal bone fracture. Osseous structures about the orbits appear intact and normally aligned bilaterally. Walls of the maxillary sinuses appear intact and normally aligned bilaterally. Bilateral zygomatic arches and pterygoid plates are intact. No mandible fracture or displacement is seen, although slight motion artifact limits characterization. Orbits: Negative. No traumatic or inflammatory finding. Sinuses: Mild mucosal thickening within the sphenoid sinus. No evidence of acute sinusitis. Soft tissues: Unremarkable.  No superficial soft tissue hematoma. CT CERVICAL SPINE FINDINGS Alignment: Levoscoliosis and straightening of the normal cervical spine lordosis, likely related to patient positioning and/or underlying degenerative change. No evidence of acute vertebral body subluxation. Skull base and vertebrae: No fracture line or displaced fracture fragment. Soft tissues and spinal canal: No prevertebral fluid or swelling. No visible canal hematoma. Disc levels: Degenerative spondylosis, mild to moderate in degree. Associated disc-osteophytic bulges  at the C3-4 through C6-7 levels causing mild to moderate central canal stenoses. Upper chest: Atherosclerosis of the thoracic aortic arch, incompletely imaged. No acute findings. Other: Carotid atherosclerosis. IMPRESSION: 1. No acute intracranial abnormality. No intracranial mass, hemorrhage or edema. No skull fracture. Chronic small vessel ischemic changes within the white matter. 2. No facial bone fracture or displacement. 3. No fracture or acute subluxation within the cervical spine. Degenerative spondylosis of the cervical spine, mild to moderate in degree, as detailed above. Aortic Atherosclerosis (ICD10-I70.0). Electronically Signed   By: Bary Richard M.D.   On: 12/20/2019 18:19   CT Cervical Spine Wo Contrast  Result Date: 12/20/2019 CLINICAL DATA:  Fall last night, found down. EXAM: CT HEAD WITHOUT CONTRAST CT MAXILLOFACIAL WITHOUT CONTRAST CT CERVICAL SPINE WITHOUT CONTRAST TECHNIQUE: Multidetector CT imaging of the head, cervical spine, and maxillofacial structures were performed using the standard protocol without intravenous contrast. Multiplanar CT image reconstructions of the cervical spine and maxillofacial structures were also generated. COMPARISON:  Head CT dated 07/10/2004. FINDINGS: CT HEAD FINDINGS Brain: There is generalized age related parenchymal volume loss with commensurate dilatation of the ventricles and sulci. Chronic small vessel ischemic changes are noted within the bilateral periventricular and subcortical white matter regions. No mass, hemorrhage, edema or other evidence of acute parenchymal abnormality. No extra-axial hemorrhage. Vascular: Chronic calcified atherosclerotic changes of the large vessels at the skull base. No unexpected hyperdense vessel. Skull: Normal. Negative for fracture or focal lesion. Other: None. CT MAXILLOFACIAL FINDINGS Osseous: Lower frontal bones are intact. No displaced nasal bone fracture. Osseous structures about the orbits appear intact and  normally aligned bilaterally. Walls of the maxillary sinuses appear intact and normally aligned bilaterally. Bilateral zygomatic arches and pterygoid plates are intact. No mandible fracture or displacement is seen, although slight motion artifact limits characterization. Orbits: Negative. No traumatic or inflammatory finding. Sinuses: Mild mucosal thickening within the sphenoid sinus. No evidence of acute sinusitis. Soft tissues: Unremarkable.  No superficial soft tissue hematoma. CT CERVICAL SPINE FINDINGS Alignment: Levoscoliosis and straightening of the normal cervical spine lordosis, likely related to patient positioning and/or underlying degenerative change. No evidence of acute vertebral body subluxation. Skull base and vertebrae: No fracture line or displaced fracture fragment. Soft tissues and spinal canal: No prevertebral fluid or swelling. No visible canal hematoma.  Disc levels: Degenerative spondylosis, mild to moderate in degree. Associated disc-osteophytic bulges at the C3-4 through C6-7 levels causing mild to moderate central canal stenoses. Upper chest: Atherosclerosis of the thoracic aortic arch, incompletely imaged. No acute findings. Other: Carotid atherosclerosis. IMPRESSION: 1. No acute intracranial abnormality. No intracranial mass, hemorrhage or edema. No skull fracture. Chronic small vessel ischemic changes within the white matter. 2. No facial bone fracture or displacement. 3. No fracture or acute subluxation within the cervical spine. Degenerative spondylosis of the cervical spine, mild to moderate in degree, as detailed above. Aortic Atherosclerosis (ICD10-I70.0). Electronically Signed   By: Bary Richard M.D.   On: 12/20/2019 18:19   ECHOCARDIOGRAM COMPLETE  Result Date: 12/21/2019    ECHOCARDIOGRAM REPORT   Patient Name:   TY OSHIMA Date of Exam: 12/21/2019 Medical Rec #:  [id number]    Height:       63.0 in Accession #:    [id number]   Weight:       138.0 lb Date of Birth:   07/30/1932    BSA:          1.652 m Patient Age:    87 years     BP:           164/69 mmHg Patient Gender: F            HR:           80 bpm. Exam Location:  Inpatient Procedure: 2D Echo, 3D Echo, Color Doppler and Cardiac Doppler Indications:    R55 Syncope  History:        Patient has prior history of Echocardiogram examinations, most                 recent 10/28/2017. Risk Factors:Hypertension, Diabetes and                 Dyslipidemia.  Sonographer:    Irving Burton Senior RDCS Referring Phys: [id number] RAVI PAHWANI IMPRESSIONS  1. Left ventricular ejection fraction, by estimation, is 60 to 65%. The left ventricle has normal function. The left ventricle has no regional wall motion abnormalities. There is mild left ventricular hypertrophy of the basal-septal segment. Left ventricular diastolic parameters are consistent with Grade I diastolic dysfunction (impaired relaxation).  2. Right ventricular systolic function is normal. The right ventricular size is normal. There is normal pulmonary artery systolic pressure.  3. Left atrial size was mildly dilated.  4. The mitral valve is normal in structure. No evidence of mitral valve regurgitation. No evidence of mitral stenosis.  5. The aortic valve is normal in structure. Aortic valve regurgitation is not visualized. No aortic stenosis is present.  6. The inferior vena cava is normal in size with <50% respiratory variability, suggesting right atrial pressure of 8 mmHg. FINDINGS  Left Ventricle: Left ventricular ejection fraction, by estimation, is 60 to 65%. The left ventricle has normal function. The left ventricle has no regional wall motion abnormalities. The left ventricular internal cavity size was normal in size. There is  mild left ventricular hypertrophy of the basal-septal segment. Left ventricular diastolic parameters are consistent with Grade I diastolic dysfunction (impaired relaxation). Indeterminate filling pressures. Right Ventricle: The right ventricular size is  normal. No increase in right ventricular wall thickness. Right ventricular systolic function is normal. There is normal pulmonary artery systolic pressure. The tricuspid regurgitant velocity is 2.03 m/s, and  with an assumed right atrial pressure of 8 mmHg, the estimated right ventricular systolic pressure is 24.5 mmHg. Left Atrium: Left atrial  size was mildly dilated. Right Atrium: Right atrial size was normal in size. Pericardium: There is no evidence of pericardial effusion. Mitral Valve: The mitral valve is normal in structure. Normal mobility of the mitral valve leaflets. No evidence of mitral valve regurgitation. No evidence of mitral valve stenosis. Tricuspid Valve: The tricuspid valve is normal in structure. Tricuspid valve regurgitation is not demonstrated. No evidence of tricuspid stenosis. Aortic Valve: The aortic valve is normal in structure. Aortic valve regurgitation is not visualized. No aortic stenosis is present. Pulmonic Valve: The pulmonic valve was normal in structure. Pulmonic valve regurgitation is not visualized. No evidence of pulmonic stenosis. Aorta: The aortic root is normal in size and structure. Venous: The inferior vena cava is normal in size with less than 50% respiratory variability, suggesting right atrial pressure of 8 mmHg. IAS/Shunts: No atrial level shunt detected by color flow Doppler.  LEFT VENTRICLE PLAX 2D LVIDd:         3.80 cm  Diastology LVIDs:         2.50 cm  LV e' lateral:   5.98 cm/s LV PW:         0.80 cm  LV E/e' lateral: 12.5 LV IVS:        1.40 cm  LV e' medial:    6.74 cm/s LVOT diam:     1.90 cm  LV E/e' medial:  11.1 LV SV:         54 LV SV Index:   33 LVOT Area:     2.84 cm                          3D Volume EF:                         3D EF:        62 %                         LV EDV:       98 ml                         LV ESV:       37 ml                         LV SV:        61 ml RIGHT VENTRICLE RV S prime:     11.90 cm/s TAPSE (M-mode): 1.9 cm LEFT ATRIUM              Index       RIGHT ATRIUM           Index LA diam:        3.60 cm 2.18 cm/m  RA Area:     10.50 cm LA Vol (A2C):   50.5 ml 30.58 ml/m RA Volume:   23.60 ml  14.29 ml/m LA Vol (A4C):   51.3 ml 31.06 ml/m LA Biplane Vol: 54.0 ml 32.70 ml/m  AORTIC VALVE LVOT Vmax:   92.00 cm/s LVOT Vmean:  67.600 cm/s LVOT VTI:    0.191 m  AORTA Ao Root diam: 3.20 cm MITRAL VALVE                TRICUSPID VALVE MV Area (PHT): 3.03 cm     TR Peak grad:   16.5 mmHg MV  Decel Time: 250 msec     TR Vmax:        203.00 cm/s MV E velocity: 75.00 cm/s MV A velocity: 125.00 cm/s  SHUNTS MV E/A ratio:  0.60         Systemic VTI:  0.19 m                             Systemic Diam: 1.90 cm Rachelle Hora Croitoru MD Electronically signed by Thurmon Fair MD Signature Date/Time: 12/21/2019/2:09:17 PM    Final    DG HIP PORT UNILAT WITH PELVIS 1V LEFT  Result Date: 12/23/2019 CLINICAL DATA:  Status post fall 3 days ago. EXAM: DG HIP (WITH OR WITHOUT PELVIS) 1V PORT LEFT COMPARISON:  None. FINDINGS: There is no evidence of hip fracture or dislocation. There is no evidence of arthropathy or other focal bone abnormality. Generalized osteopenia. Peripheral vascular atherosclerosis. IMPRESSION: No acute osseous injury of the left hip. Given the patient's age and osteopenia, if there is persistent clinical concern for an occult hip fracture, a MRI of the hip is recommended for increased sensitivity. Electronically Signed   By: Elige Ko   On: 12/23/2019 12:02   CT Maxillofacial WO CM  Result Date: 12/20/2019 CLINICAL DATA:  Fall last night, found down. EXAM: CT HEAD WITHOUT CONTRAST CT MAXILLOFACIAL WITHOUT CONTRAST CT CERVICAL SPINE WITHOUT CONTRAST TECHNIQUE: Multidetector CT imaging of the head, cervical spine, and maxillofacial structures were performed using the standard protocol without intravenous contrast. Multiplanar CT image reconstructions of the cervical spine and maxillofacial structures were also generated. COMPARISON:   Head CT dated 07/10/2004. FINDINGS: CT HEAD FINDINGS Brain: There is generalized age related parenchymal volume loss with commensurate dilatation of the ventricles and sulci. Chronic small vessel ischemic changes are noted within the bilateral periventricular and subcortical white matter regions. No mass, hemorrhage, edema or other evidence of acute parenchymal abnormality. No extra-axial hemorrhage. Vascular: Chronic calcified atherosclerotic changes of the large vessels at the skull base. No unexpected hyperdense vessel. Skull: Normal. Negative for fracture or focal lesion. Other: None. CT MAXILLOFACIAL FINDINGS Osseous: Lower frontal bones are intact. No displaced nasal bone fracture. Osseous structures about the orbits appear intact and normally aligned bilaterally. Walls of the maxillary sinuses appear intact and normally aligned bilaterally. Bilateral zygomatic arches and pterygoid plates are intact. No mandible fracture or displacement is seen, although slight motion artifact limits characterization. Orbits: Negative. No traumatic or inflammatory finding. Sinuses: Mild mucosal thickening within the sphenoid sinus. No evidence of acute sinusitis. Soft tissues: Unremarkable.  No superficial soft tissue hematoma. CT CERVICAL SPINE FINDINGS Alignment: Levoscoliosis and straightening of the normal cervical spine lordosis, likely related to patient positioning and/or underlying degenerative change. No evidence of acute vertebral body subluxation. Skull base and vertebrae: No fracture line or displaced fracture fragment. Soft tissues and spinal canal: No prevertebral fluid or swelling. No visible canal hematoma. Disc levels: Degenerative spondylosis, mild to moderate in degree. Associated disc-osteophytic bulges at the C3-4 through C6-7 levels causing mild to moderate central canal stenoses. Upper chest: Atherosclerosis of the thoracic aortic arch, incompletely imaged. No acute findings. Other: Carotid  atherosclerosis. IMPRESSION: 1. No acute intracranial abnormality. No intracranial mass, hemorrhage or edema. No skull fracture. Chronic small vessel ischemic changes within the white matter. 2. No facial bone fracture or displacement. 3. No fracture or acute subluxation within the cervical spine. Degenerative spondylosis of the cervical spine, mild to moderate in degree, as detailed above.  Aortic Atherosclerosis (ICD10-I70.0). Electronically Signed   By: Bary RichardStan  Maynard M.D.   On: 12/20/2019 18:19    Lab Data:  CBC: Recent Labs  Lab 12/20/19 1637 12/21/19 0031 12/21/19 0545 12/23/19 0558 12/24/19 0541  WBC 5.5 5.4 5.9 4.8 4.5  NEUTROABS 4.2  --   --   --   --   HGB 11.6* 11.1* 11.3* 11.2* 9.7*  HCT 35.8* 33.9* 35.2* 34.3* 30.3*  MCV 92.3 92.6 94.4 92.7 95.6  PLT 207 201 201 204 185   Basic Metabolic Panel: Recent Labs  Lab 12/20/19 1637 12/21/19 0031 12/21/19 0545 12/23/19 0558 12/24/19 0541  NA 144  --  144 141 140  K 3.7  --  3.4* 3.8 4.1  CL 109  --  108 109 106  CO2 25  --  23 25 26   GLUCOSE 148*  --  177* 123* 84  BUN 19  --  16 20 17   CREATININE 0.67 0.69 0.63 0.70 0.65  CALCIUM 9.3  --  8.8* 9.1 9.0  MG  --  1.7  --   --   --    GFR: Estimated Creatinine Clearance: 42.5 mL/min (by C-G formula based on SCr of 0.65 mg/dL). Liver Function Tests: Recent Labs  Lab 12/20/19 1637  AST 28  ALT 15  ALKPHOS 60  BILITOT 1.0  PROT 6.7  ALBUMIN 3.4*   No results for input(s): LIPASE, AMYLASE in the last 168 hours. No results for input(s): AMMONIA in the last 168 hours. Coagulation Profile: No results for input(s): INR, PROTIME in the last 168 hours. Cardiac Enzymes: Recent Labs  Lab 12/20/19 1637 12/21/19 1653  CKTOTAL 365* 163   BNP (last 3 results) No results for input(s): PROBNP in the last 8760 hours. HbA1C: No results for input(s): HGBA1C in the last 72 hours. CBG: Recent Labs  Lab 12/23/19 2036 12/24/19 0003 12/24/19 0451 12/24/19 0802  12/24/19 1226  GLUCAP 115* 106* 73 76 146*   Lipid Profile: No results for input(s): CHOL, HDL, LDLCALC, TRIG, CHOLHDL, LDLDIRECT in the last 72 hours. Thyroid Function Tests: No results for input(s): TSH, T4TOTAL, FREET4, T3FREE, THYROIDAB in the last 72 hours. Anemia Panel: No results for input(s): VITAMINB12, FOLATE, FERRITIN, TIBC, IRON, RETICCTPCT in the last 72 hours. Urine analysis:    Component Value Date/Time   COLORURINE YELLOW 12/20/2019 2120   APPEARANCEUR CLEAR 12/20/2019 2120   LABSPEC 1.018 12/20/2019 2120   PHURINE 5.0 12/20/2019 2120   GLUCOSEU NEGATIVE 12/20/2019 2120   HGBUR SMALL (A) 12/20/2019 2120   BILIRUBINUR NEGATIVE 12/20/2019 2120   KETONESUR 20 (A) 12/20/2019 2120   PROTEINUR 100 (A) 12/20/2019 2120   UROBILINOGEN 0.2 02/21/2015 2355   NITRITE NEGATIVE 12/20/2019 2120   LEUKOCYTESUR SMALL (A) 12/20/2019 2120     Saivon Prowse M.D. [facility] 12/24/2019, 12:53 PM   Call night coverage person covering after 7pm

## 2019-12-25 LAB — BASIC METABOLIC PANEL
Anion gap: 9 (ref 5–15)
BUN: 19 mg/dL (ref 8–23)
CO2: 25 mmol/L (ref 22–32)
Calcium: 9.1 mg/dL (ref 8.9–10.3)
Chloride: 106 mmol/L (ref 98–111)
Creatinine, Ser: 0.69 mg/dL (ref 0.44–1.00)
GFR calc Af Amer: >60 mL/min (ref >60)
GFR calc non Af Amer: >60 mL/min (ref >60)
Glucose, Bld: 101 mg/dL — ABNORMAL HIGH (ref 70–99)
Potassium: 3.8 mmol/L (ref 3.5–5.1)
Sodium: 140 mmol/L (ref 135–145)

## 2019-12-25 LAB — GLUCOSE, CAPILLARY
Glucose-Capillary: 118 mg/dL — ABNORMAL HIGH (ref 70–99)
Glucose-Capillary: 175 mg/dL — ABNORMAL HIGH (ref 70–99)
Glucose-Capillary: 89 mg/dL (ref 70–99)

## 2019-12-25 LAB — CBC
HCT: 35.2 % — ABNORMAL LOW (ref 36.0–46.0)
Hemoglobin: 11.1 g/dL — ABNORMAL LOW (ref 12.0–15.0)
MCH: 29.4 pg (ref 26.0–34.0)
MCHC: 31.5 g/dL (ref 30.0–36.0)
MCV: 93.4 fL (ref 80.0–100.0)
Platelets: 208 10*3/uL (ref 150–400)
RBC: 3.77 MIL/uL — ABNORMAL LOW (ref 3.87–5.11)
RDW: 12.9 % (ref 11.5–15.5)
WBC: 5.1 10*3/uL (ref 4.0–10.5)
nRBC: 0 % (ref 0.0–0.2)

## 2019-12-25 MED ORDER — MELATONIN 3 MG PO TABS
6.0000 mg | ORAL_TABLET | Freq: Every evening | ORAL | Status: DC | PRN
  Administered 2019-12-25: 6 mg via ORAL
  Filled 2019-12-25: qty 2

## 2019-12-25 NOTE — Progress Notes (Signed)
This RN attempted to call report x2 to [facility] and was left on hold and was unable to speak with a nurse. [facility] is here to transport the patient to room 101B at [facility]. Will attempt to call report to the facility again.

## 2019-12-25 NOTE — Discharge Summary (Signed)
Physician Discharge Summary  Carolyn Bishop ZOX:[id number] DOB: Jan 28, 1933 DOA: 12/20/2019  PCP: Patient, No Pcp Per  Admit date: 12/20/2019 Discharge date: 12/25/2019  Disposition:  [facility] Discharging physician: Lewie Chamber, MD   Patient discharged to [facility] in Discharge Condition: fair CODE STATUS: Full Diet recommendation:  Diet Orders (From admission, onward)    Start     Ordered   12/25/19 0000  Diet - low sodium heart healthy        12/25/19 1149   12/21/19 1509  Diet heart healthy/carb modified Room service appropriate? Yes; Fluid consistency: Thin  Diet effective now       Question Answer Comment  Diet-HS Snack? Nothing   Room service appropriate? Yes   Fluid consistency: Thin      12/21/19 1508          Hospital Course:  86 year old female with history of hyperlipidemia, hypertension, DM type II, frequent falls who wasrecently hospitalized from 6/20- 6/24 due to acute encephalopathy secondary to Bactrim resistant E .coli UTI and weakness -> was discharged to SNF-->Went home 3 weeks back-> back in hospital with recurrent falls, lives alone, niece next door and convinced her to come to ED. ED Course:Afebrile,Slightly hypertensive. Most of the lab work was unremarkable with slightly elevated CK levelat 365.Potassium 3.7.Due to her having some bruises on the face, she underwent extensive imaging studies including CT maxillofacial, CT cervical spine, CT head, chest x-ray, left knee x-ray, right shoulder x-ray and right wrist x-ray and all of them were unremarkable. UA showed positive leukoesterase but no nitrites. She was presumed to have UTI.  Recurrent falls -Patient feels chronic ataxia and balance issues leading to falls.   -Urine culture showed less than 10,000 colonies, antibiotics discontinued -CT head showed no acute intracranial abnormality, no mass hemorrhage or edema, chronic small vessel ischemic changes -Left hip x-ray showed  no acute osseous injury of the left hip.  On exam, no pain - Has off-and-on cognitive deficits, with chronic visual issues.  Confirmed with patient's niece.  No acute neurological deficits on exam. -PT recommending skilled nursing facility, social work assisting  Intentional tremor - does not appear to cause much interference with ADLs - if more problematic and HR can tolerate, can consider low dose BB trial  Mild CK elevation -CK 365 -In the setting of recurrent falls, patient was placed on IV fluid hydration -CK now improved  Acute metabolic encephalopathy - initially presumed multifactorial; after further monitoring and treatment/workup she has likely a component of underlying dementia from chronic small vessel changes seen on CT head.   Hypertension -Continue amlodipine   Prediabetes Recent A1c, 6.3, continue sliding scale insulin  Moderate manutrition Estimated body mass index is 24.45 kg/m as calculated from the following:   Height as of this encounter:  (1.6 m).   Weight as of this encounter: 62.6 kg.   Discharge Diagnoses:   Principal Diagnosis: Fall at home  Active [facility] Problems   Diagnosis Date Noted  . Malnutrition of moderate degree 12/22/2019  . Fall at home 12/20/2019  . Prediabetes 10/27/2017  . Essential hypertension 10/27/2017    Resolved Hospital Problems   Diagnosis Date Noted Date Resolved  . Syncope and collapse 12/20/2019 12/25/2019  . Acute metabolic encephalopathy 12/20/2019 12/25/2019  . Acute cystitis 10/28/2019 12/25/2019    Discharge Instructions    Diet - low sodium heart healthy   Complete by: As directed    Increase activity slowly   Complete by: As directed  Allergies as of 12/25/2019   No Known Allergies     Medication List    TAKE these medications   amLODipine 5 MG tablet Commonly known as: NORVASC Take 1 tablet (5 mg total) by mouth daily.       No Known Allergies  Discharge Exam: BP (!) 164/61  (BP Location: Left Arm)   Pulse 74   Temp 98.3 F (36.8 C) (Oral)   Resp 17   Ht 5\' 3"  (1.6 m)   Wt 62.6 kg   SpO2 100%   BMI 24.45 kg/m  General appearance: alert, cooperative and no distress Head: Normocephalic, without obvious abnormality Eyes: EOMI Lungs: clear to auscultation bilaterally Heart: regular rate and rhythm and S1, S2 normal Abdomen: normal findings: bowel sounds normal and soft, non-tender Extremities: intentional tremor apprecaited in UE's, no LE edema Skin: mobility and turgor normal Neurologic: intentional tremor in upper extremities, slight resting tremor in upper extremities, strength symmetric   The results of significant diagnostics from this hospitalization (including imaging, microbiology, ancillary and laboratory) are listed below for reference.   Microbiology: Recent Results (from the past 240 hour(s))  SARS Coronavirus 2 by RT PCR (hospital order, performed in [facility] hospital lab) Nasopharyngeal Nasopharyngeal Swab     Status: None   Collection Time: 12/20/19  4:39 PM   Specimen: Nasopharyngeal Swab  Result Value Ref Range Status   SARS Coronavirus 2 NEGATIVE NEGATIVE Final    Comment: (NOTE) SARS-CoV-2 target nucleic acids are NOT DETECTED.  The SARS-CoV-2 RNA is generally detectable in upper and lower respiratory specimens during the acute phase of infection. The lowest concentration of SARS-CoV-2 viral copies this assay can detect is 250 copies / mL. A negative result does not preclude SARS-CoV-2 infection and should not be used as the sole basis for treatment or other patient management decisions.  A negative result may occur with improper specimen collection / handling, submission of specimen other than nasopharyngeal swab, presence of viral mutation(s) within the areas targeted by this assay, and inadequate number of viral copies (<250 copies / mL). A negative result must be combined with clinical observations, patient history, and  epidemiological information.  Fact Sheet for Patients:   BoilerBrush.com.cy  Fact Sheet for Healthcare Providers: https://pope.com/  This test is not yet approved or  cleared by the [place] FDA and has been authorized for detection and/or diagnosis of SARS-CoV-2 by FDA under an Emergency Use Authorization (EUA).  This EUA will remain in effect (meaning this test can be used) for the duration of the COVID-19 declaration under Section 564(b)(1) of the Act, 21 U.S.C. section 360bbb-3(b)(1), unless the authorization is terminated or revoked sooner.  Performed at [facility], [address] [place], [place] [address]   Urine culture     Status: Abnormal   Collection Time: 12/20/19  9:20 PM   Specimen: Urine, Random  Result Value Ref Range Status   Specimen Description   Final    URINE, RANDOM Performed at [facility], [address] [place], [place] [address]    Special Requests   Final    NONE Performed at [facility], [address], [place], [place] [address]    Culture (A)  Final    <10,000 COLONIES/mL INSIGNIFICANT GROWTH Performed at [facility], [address], [place], [place] [address]    Report Status 12/21/2019 FINAL  Final  SARS Coronavirus 2 by RT PCR (hospital order, performed in [facility] hospital  lab) Nasopharyngeal Nasopharyngeal Swab     Status: None   Collection Time: 12/24/19  2:09 PM   Specimen: Nasopharyngeal Swab  Result Value Ref Range Status   SARS Coronavirus 2 NEGATIVE NEGATIVE Final    Comment: (NOTE) SARS-CoV-2 target nucleic acids are NOT DETECTED.  The SARS-CoV-2 RNA is generally detectable in upper and lower respiratory specimens during the acute phase of infection. The lowest concentration of SARS-CoV-2 viral copies this assay can detect is 250 copies / mL. A negative result does not preclude SARS-CoV-2 infection and  should not be used as the sole basis for treatment or other patient management decisions.  A negative result may occur with improper specimen collection / handling, submission of specimen other than nasopharyngeal swab, presence of viral mutation(s) within the areas targeted by this assay, and inadequate number of viral copies (<250 copies / mL). A negative result must be combined with clinical observations, patient history, and epidemiological information.  Fact Sheet for Patients:   BoilerBrush.com.cy  Fact Sheet for Healthcare Providers: https://pope.com/  This test is not yet approved or  cleared by the [place] FDA and has been authorized for detection and/or diagnosis of SARS-CoV-2 by FDA under an Emergency Use Authorization (EUA).  This EUA will remain in effect (meaning this test can be used) for the duration of the COVID-19 declaration under Section 564(b)(1) of the Act, 21 U.S.C. section 360bbb-3(b)(1), unless the authorization is terminated or revoked sooner.  Performed at [facility], [address] [place], [place] [address]      Labs: BNP (last 3 results) No results for input(s): BNP in the last 8760 hours. Basic Metabolic Panel: Recent Labs  Lab 12/20/19 1637 12/20/19 1637 12/21/19 0031 12/21/19 0545 12/23/19 0558 12/24/19 0541 12/25/19 0613  NA 144  --   --  144 141 140 140  K 3.7  --   --  3.4* 3.8 4.1 3.8  CL 109  --   --  108 109 106 106  CO2 25  --   --  GLUCOSE 148*  --   --  177* 123* 84 101*  BUN 19  --   --  CREATININE 0.67   < > 0.69 0.63 0.70 0.65 0.69  CALCIUM 9.3  --   --  8.8* 9.1 9.0 9.1  MG  --   --  1.7  --   --   --   --    < > = values in this interval not displayed.   Liver Function Tests: Recent Labs  Lab 12/20/19 1637  AST 28  ALT 15  ALKPHOS 60  BILITOT 1.0  PROT 6.7  ALBUMIN 3.4*   No results for input(s): LIPASE,  AMYLASE in the last 168 hours. No results for input(s): AMMONIA in the last 168 hours. CBC: Recent Labs  Lab 12/20/19 1637 12/20/19 1637 12/21/19 0031 12/21/19 0545 12/23/19 0558 12/24/19 0541 12/25/19 0813  WBC 5.5   < > 5.4 5.9 4.8 4.5 5.1  NEUTROABS 4.2  --   --   --   --   --   --   HGB 11.6*   < > 11.1* 11.3* 11.2* 9.7* 11.1*  HCT 35.8*   < > 33.9* 35.2* 34.3* 30.3* 35.2*  MCV 92.3   < > 92.6 94.4 92.7 95.6 93.4  PLT 207   < > 201 201 204 185 208   < > = values in this  interval not displayed.   Cardiac Enzymes: Recent Labs  Lab 12/20/19 1637 12/21/19 1653  CKTOTAL 365* 163   BNP: Invalid input(s): POCBNP CBG: Recent Labs  Lab 12/24/19 1226 12/24/19 1648 12/24/19 2115 12/25/19 0005 12/25/19 0727  GLUCAP 146* 152* 107* 118* 89   D-Dimer No results for input(s): DDIMER in the last 72 hours. Hgb A1c No results for input(s): HGBA1C in the last 72 hours. Lipid Profile No results for input(s): CHOL, HDL, LDLCALC, TRIG, CHOLHDL, LDLDIRECT in the last 72 hours. Thyroid function studies No results for input(s): TSH, T4TOTAL, T3FREE, THYROIDAB in the last 72 hours.  Invalid input(s): FREET3 Anemia work up No results for input(s): VITAMINB12, FOLATE, FERRITIN, TIBC, IRON, RETICCTPCT in the last 72 hours. Urinalysis    Component Value Date/Time   COLORURINE YELLOW 12/20/2019 2120   APPEARANCEUR CLEAR 12/20/2019 2120   LABSPEC 1.018 12/20/2019 2120   PHURINE 5.0 12/20/2019 2120   GLUCOSEU NEGATIVE 12/20/2019 2120   HGBUR SMALL (A) 12/20/2019 2120   BILIRUBINUR NEGATIVE 12/20/2019 2120   KETONESUR 20 (A) 12/20/2019 2120   PROTEINUR 100 (A) 12/20/2019 2120   UROBILINOGEN 0.2 02/21/2015 2355   NITRITE NEGATIVE 12/20/2019 2120   LEUKOCYTESUR SMALL (A) 12/20/2019 2120   Sepsis Labs Invalid input(s): PROCALCITONIN,  WBC,  LACTICIDVEN Microbiology Recent Results (from the past 240 hour(s))  SARS Coronavirus 2 by RT PCR (hospital order, performed in [facility]  hospital lab) Nasopharyngeal Nasopharyngeal Swab     Status: None   Collection Time: 12/20/19  4:39 PM   Specimen: Nasopharyngeal Swab  Result Value Ref Range Status   SARS Coronavirus 2 NEGATIVE NEGATIVE Final    Comment: (NOTE) SARS-CoV-2 target nucleic acids are NOT DETECTED.  The SARS-CoV-2 RNA is generally detectable in upper and lower respiratory specimens during the acute phase of infection. The lowest concentration of SARS-CoV-2 viral copies this assay can detect is 250 copies / mL. A negative result does not preclude SARS-CoV-2 infection and should not be used as the sole basis for treatment or other patient management decisions.  A negative result may occur with improper specimen collection / handling, submission of specimen other than nasopharyngeal swab, presence of viral mutation(s) within the areas targeted by this assay, and inadequate number of viral copies (<250 copies / mL). A negative result must be combined with clinical observations, patient history, and epidemiological information.  Fact Sheet for Patients:   BoilerBrush.com.cy  Fact Sheet for Healthcare Providers: https://pope.com/  This test is not yet approved or  cleared by the [place] FDA and has been authorized for detection and/or diagnosis of SARS-CoV-2 by FDA under an Emergency Use Authorization (EUA).  This EUA will remain in effect (meaning this test can be used) for the duration of the COVID-19 declaration under Section 564(b)(1) of the Act, 21 U.S.C. section 360bbb-3(b)(1), unless the authorization is terminated or revoked sooner.  Performed at [facility], [address], [place], [place] [address]   Urine culture     Status: Abnormal   Collection Time: 12/20/19  9:20 PM   Specimen: Urine, Random  Result Value Ref Range Status   Specimen Description   Final    URINE, RANDOM Performed at [facility], [address] [place], [place] [address]    Special Requests   Final    NONE Performed at [facility], [address] [place], [place] [address]    Culture (A)  Final    <10,000 COLONIES/mL INSIGNIFICANT GROWTH  Performed at [facility], [address] [place], [place] [address]    Report Status 12/21/2019 FINAL  Final  SARS Coronavirus 2 by RT PCR (hospital order, performed in [facility] hospital lab) Nasopharyngeal Nasopharyngeal Swab     Status: None   Collection Time: 12/24/19  2:09 PM   Specimen: Nasopharyngeal Swab  Result Value Ref Range Status   SARS Coronavirus 2 NEGATIVE NEGATIVE Final    Comment: (NOTE) SARS-CoV-2 target nucleic acids are NOT DETECTED.  The SARS-CoV-2 RNA is generally detectable in upper and lower respiratory specimens during the acute phase of infection. The lowest concentration of SARS-CoV-2 viral copies this assay can detect is 250 copies / mL. A negative result does not preclude SARS-CoV-2 infection and should not be used as the sole basis for treatment or other patient management decisions.  A negative result may occur with improper specimen collection / handling, submission of specimen other than nasopharyngeal swab, presence of viral mutation(s) within the areas targeted by this assay, and inadequate number of viral copies (<250 copies / mL). A negative result must be combined with clinical observations, patient history, and epidemiological information.  Fact Sheet for Patients:   BoilerBrush.com.cyhttps://www.fda.gov/media/136312/download  Fact Sheet for Healthcare Providers: https://pope.com/https://www.fda.gov/media/136313/download  This test is not yet approved or  cleared by the [place] FDA and has been authorized for detection and/or diagnosis of SARS-CoV-2 by FDA under an Emergency Use Authorization (EUA).  This EUA will remain in effect (meaning this test can be used) for the duration of the COVID-19 declaration under  Section 564(b)(1) of the Act, 21 U.S.C. section 360bbb-3(b)(1), unless the authorization is terminated or revoked sooner.  Performed at [facility], [address] [place], [place] [address]     Procedures/Studies: DG Chest 2 View  Result Date: 12/20/2019 CLINICAL DATA:  Fall EXAM: CHEST - 2 VIEW COMPARISON:  10/28/2019 FINDINGS: Cardiomegaly.  Lungs clear.  No effusions or acute bony abnormality. IMPRESSION: Cardiomegaly.  No active disease. Electronically Signed   By: Charlett NoseKevin  Dover M.D.   On: 12/20/2019 18:09   DG Shoulder Right  Result Date: 12/20/2019 CLINICAL DATA:  Fall, shoulder pain EXAM: RIGHT SHOULDER - 2+ VIEW COMPARISON:  None. FINDINGS: Advanced arthritic changes in the right wrist. Complete loss of the subacromial space. No fracture or dislocation. IMPRESSION: No acute bony abnormality.  Advanced arthritic changes. Electronically Signed   By: Charlett NoseKevin  Dover M.D.   On: 12/20/2019 18:08   DG Wrist Complete Right  Result Date: 12/20/2019 CLINICAL DATA:  Fall, wrist pain EXAM: RIGHT WRIST - COMPLETE 3+ VIEW COMPARISON:  None. FINDINGS: Advanced degenerative changes at the radiocarpal joint. No acute bony abnormality. Specifically, no fracture, subluxation, or dislocation. IMPRESSION: No acute bony abnormality. Electronically Signed   By: Charlett NoseKevin  Dover M.D.   On: 12/20/2019 18:08   DG Knee 2 Views Left  Result Date: 12/20/2019 CLINICAL DATA:  Fall, left knee pain EXAM: LEFT KNEE - 1-2 VIEW COMPARISON:  None. FINDINGS: Moderate tricompartment degenerative changes with joint space narrowing and spurring. No acute bony abnormality. Specifically, no fracture, subluxation, or dislocation. No joint effusion. IMPRESSION: Moderate degenerative changes.  No acute bony abnormality. Electronically Signed   By: Charlett NoseKevin  Dover M.D.   On: 12/20/2019 18:09   CT Head Wo Contrast  Result Date: 12/20/2019 CLINICAL DATA:  Fall last night, found down. EXAM: CT HEAD WITHOUT CONTRAST CT  MAXILLOFACIAL WITHOUT CONTRAST CT CERVICAL SPINE WITHOUT CONTRAST TECHNIQUE: Multidetector CT imaging of the head, cervical spine,  and maxillofacial structures were performed using the standard protocol without intravenous contrast. Multiplanar CT image reconstructions of the cervical spine and maxillofacial structures were also generated. COMPARISON:  Head CT dated 07/10/2004. FINDINGS: CT HEAD FINDINGS Brain: There is generalized age related parenchymal volume loss with commensurate dilatation of the ventricles and sulci. Chronic small vessel ischemic changes are noted within the bilateral periventricular and subcortical white matter regions. No mass, hemorrhage, edema or other evidence of acute parenchymal abnormality. No extra-axial hemorrhage. Vascular: Chronic calcified atherosclerotic changes of the large vessels at the skull base. No unexpected hyperdense vessel. Skull: Normal. Negative for fracture or focal lesion. Other: None. CT MAXILLOFACIAL FINDINGS Osseous: Lower frontal bones are intact. No displaced nasal bone fracture. Osseous structures about the orbits appear intact and normally aligned bilaterally. Walls of the maxillary sinuses appear intact and normally aligned bilaterally. Bilateral zygomatic arches and pterygoid plates are intact. No mandible fracture or displacement is seen, although slight motion artifact limits characterization. Orbits: Negative. No traumatic or inflammatory finding. Sinuses: Mild mucosal thickening within the sphenoid sinus. No evidence of acute sinusitis. Soft tissues: Unremarkable.  No superficial soft tissue hematoma. CT CERVICAL SPINE FINDINGS Alignment: Levoscoliosis and straightening of the normal cervical spine lordosis, likely related to patient positioning and/or underlying degenerative change. No evidence of acute vertebral body subluxation. Skull base and vertebrae: No fracture line or displaced fracture fragment. Soft tissues and spinal canal: No prevertebral  fluid or swelling. No visible canal hematoma. Disc levels: Degenerative spondylosis, mild to moderate in degree. Associated disc-osteophytic bulges at the C3-4 through C6-7 levels causing mild to moderate central canal stenoses. Upper chest: Atherosclerosis of the thoracic aortic arch, incompletely imaged. No acute findings. Other: Carotid atherosclerosis. IMPRESSION: 1. No acute intracranial abnormality. No intracranial mass, hemorrhage or edema. No skull fracture. Chronic small vessel ischemic changes within the white matter. 2. No facial bone fracture or displacement. 3. No fracture or acute subluxation within the cervical spine. Degenerative spondylosis of the cervical spine, mild to moderate in degree, as detailed above. Aortic Atherosclerosis (ICD10-I70.0). Electronically Signed   By: Bary Richard M.D.   On: 12/20/2019 18:19   CT Cervical Spine Wo Contrast  Result Date: 12/20/2019 CLINICAL DATA:  Fall last night, found down. EXAM: CT HEAD WITHOUT CONTRAST CT MAXILLOFACIAL WITHOUT CONTRAST CT CERVICAL SPINE WITHOUT CONTRAST TECHNIQUE: Multidetector CT imaging of the head, cervical spine, and maxillofacial structures were performed using the standard protocol without intravenous contrast. Multiplanar CT image reconstructions of the cervical spine and maxillofacial structures were also generated. COMPARISON:  Head CT dated 07/10/2004. FINDINGS: CT HEAD FINDINGS Brain: There is generalized age related parenchymal volume loss with commensurate dilatation of the ventricles and sulci. Chronic small vessel ischemic changes are noted within the bilateral periventricular and subcortical white matter regions. No mass, hemorrhage, edema or other evidence of acute parenchymal abnormality. No extra-axial hemorrhage. Vascular: Chronic calcified atherosclerotic changes of the large vessels at the skull base. No unexpected hyperdense vessel. Skull: Normal. Negative for fracture or focal lesion. Other: None. CT  MAXILLOFACIAL FINDINGS Osseous: Lower frontal bones are intact. No displaced nasal bone fracture. Osseous structures about the orbits appear intact and normally aligned bilaterally. Walls of the maxillary sinuses appear intact and normally aligned bilaterally. Bilateral zygomatic arches and pterygoid plates are intact. No mandible fracture or displacement is seen, although slight motion artifact limits characterization. Orbits: Negative. No traumatic or inflammatory finding. Sinuses: Mild mucosal thickening within the sphenoid sinus. No evidence of acute sinusitis. Soft tissues: Unremarkable.  No  superficial soft tissue hematoma. CT CERVICAL SPINE FINDINGS Alignment: Levoscoliosis and straightening of the normal cervical spine lordosis, likely related to patient positioning and/or underlying degenerative change. No evidence of acute vertebral body subluxation. Skull base and vertebrae: No fracture line or displaced fracture fragment. Soft tissues and spinal canal: No prevertebral fluid or swelling. No visible canal hematoma. Disc levels: Degenerative spondylosis, mild to moderate in degree. Associated disc-osteophytic bulges at the C3-4 through C6-7 levels causing mild to moderate central canal stenoses. Upper chest: Atherosclerosis of the thoracic aortic arch, incompletely imaged. No acute findings. Other: Carotid atherosclerosis. IMPRESSION: 1. No acute intracranial abnormality. No intracranial mass, hemorrhage or edema. No skull fracture. Chronic small vessel ischemic changes within the white matter. 2. No facial bone fracture or displacement. 3. No fracture or acute subluxation within the cervical spine. Degenerative spondylosis of the cervical spine, mild to moderate in degree, as detailed above. Aortic Atherosclerosis (ICD10-I70.0). Electronically Signed   By: Bary Richard M.D.   On: 12/20/2019 18:19   ECHOCARDIOGRAM COMPLETE  Result Date: 12/21/2019    ECHOCARDIOGRAM REPORT   Patient Name:   CYD HOSTLER  Date of Exam: 12/21/2019 Medical Rec #:  [id number]    Height:       63.0 in Accession #:    [id number]   Weight:       138.0 lb Date of Birth:  [birth year]    BSA:          1.652 m Patient Age:    87 years     BP:           164/69 mmHg Patient Gender: F            HR:           80 bpm. Exam Location:  Inpatient Procedure: 2D Echo, 3D Echo, Color Doppler and Cardiac Doppler Indications:    R55 Syncope  History:        Patient has prior history of Echocardiogram examinations, most                 recent 10/28/2017. Risk Factors:Hypertension, Diabetes and                 Dyslipidemia.  Sonographer:    Irving Burton Senior RDCS Referring Phys: [id number] RAVI PAHWANI IMPRESSIONS  1. Left ventricular ejection fraction, by estimation, is 60 to 65%. The left ventricle has normal function. The left ventricle has no regional wall motion abnormalities. There is mild left ventricular hypertrophy of the basal-septal segment. Left ventricular diastolic parameters are consistent with Grade I diastolic dysfunction (impaired relaxation).  2. Right ventricular systolic function is normal. The right ventricular size is normal. There is normal pulmonary artery systolic pressure.  3. Left atrial size was mildly dilated.  4. The mitral valve is normal in structure. No evidence of mitral valve regurgitation. No evidence of mitral stenosis.  5. The aortic valve is normal in structure. Aortic valve regurgitation is not visualized. No aortic stenosis is present.  6. The inferior vena cava is normal in size with <50% respiratory variability, suggesting right atrial pressure of 8 mmHg. FINDINGS  Left Ventricle: Left ventricular ejection fraction, by estimation, is 60 to 65%. The left ventricle has normal function. The left ventricle has no regional wall motion abnormalities. The left ventricular internal cavity size was normal in size. There is  mild left ventricular hypertrophy of the basal-septal segment. Left ventricular diastolic parameters are  consistent with Grade I diastolic dysfunction (impaired relaxation). Indeterminate filling pressures.  Right Ventricle: The right ventricular size is normal. No increase in right ventricular wall thickness. Right ventricular systolic function is normal. There is normal pulmonary artery systolic pressure. The tricuspid regurgitant velocity is 2.03 m/s, and  with an assumed right atrial pressure of 8 mmHg, the estimated right ventricular systolic pressure is 24.5 mmHg. Left Atrium: Left atrial size was mildly dilated. Right Atrium: Right atrial size was normal in size. Pericardium: There is no evidence of pericardial effusion. Mitral Valve: The mitral valve is normal in structure. Normal mobility of the mitral valve leaflets. No evidence of mitral valve regurgitation. No evidence of mitral valve stenosis. Tricuspid Valve: The tricuspid valve is normal in structure. Tricuspid valve regurgitation is not demonstrated. No evidence of tricuspid stenosis. Aortic Valve: The aortic valve is normal in structure. Aortic valve regurgitation is not visualized. No aortic stenosis is present. Pulmonic Valve: The pulmonic valve was normal in structure. Pulmonic valve regurgitation is not visualized. No evidence of pulmonic stenosis. Aorta: The aortic root is normal in size and structure. Venous: The inferior vena cava is normal in size with less than 50% respiratory variability, suggesting right atrial pressure of 8 mmHg. IAS/Shunts: No atrial level shunt detected by color flow Doppler.  LEFT VENTRICLE PLAX 2D LVIDd:         3.80 cm  Diastology LVIDs:         2.50 cm  LV e' lateral:   5.98 cm/s LV PW:         0.80 cm  LV E/e' lateral: 12.5 LV IVS:        1.40 cm  LV e' medial:    6.74 cm/s LVOT diam:     1.90 cm  LV E/e' medial:  11.1 LV SV:         54 LV SV Index:   33 LVOT Area:     2.84 cm                          3D Volume EF:                         3D EF:        62 %                         LV EDV:       98 ml                          LV ESV:       37 ml                         LV SV:        61 ml RIGHT VENTRICLE RV S prime:     11.90 cm/s TAPSE (M-mode): 1.9 cm LEFT ATRIUM             Index       RIGHT ATRIUM           Index LA diam:        3.60 cm 2.18 cm/m  RA Area:     10.50 cm LA Vol (A2C):   50.5 ml 30.58 ml/m RA Volume:   23.60 ml  14.29 ml/m LA Vol (A4C):   51.3 ml 31.06 ml/m LA Biplane Vol: 54.0 ml 32.70 ml/m  AORTIC VALVE LVOT Vmax:  92.00 cm/s LVOT Vmean:  67.600 cm/s LVOT VTI:    0.191 m  AORTA Ao Root diam: 3.20 cm MITRAL VALVE                TRICUSPID VALVE MV Area (PHT): 3.03 cm     TR Peak grad:   16.5 mmHg MV Decel Time: 250 msec     TR Vmax:        203.00 cm/s MV E velocity: 75.00 cm/s MV A velocity: 125.00 cm/s  SHUNTS MV E/A ratio:  0.60         Systemic VTI:  0.19 m                             Systemic Diam: 1.90 cm Rachelle Hora Croitoru MD Electronically signed by Thurmon Fair MD Signature Date/Time: 12/21/2019/2:09:17 PM    Final    DG HIP PORT UNILAT WITH PELVIS 1V LEFT  Result Date: 12/23/2019 CLINICAL DATA:  Status post fall 3 days ago. EXAM: DG HIP (WITH OR WITHOUT PELVIS) 1V PORT LEFT COMPARISON:  None. FINDINGS: There is no evidence of hip fracture or dislocation. There is no evidence of arthropathy or other focal bone abnormality. Generalized osteopenia. Peripheral vascular atherosclerosis. IMPRESSION: No acute osseous injury of the left hip. Given the patient's age and osteopenia, if there is persistent clinical concern for an occult hip fracture, a MRI of the hip is recommended for increased sensitivity. Electronically Signed   By: Elige Ko   On: 12/23/2019 12:02   CT Maxillofacial WO CM  Result Date: 12/20/2019 CLINICAL DATA:  Fall last night, found down. EXAM: CT HEAD WITHOUT CONTRAST CT MAXILLOFACIAL WITHOUT CONTRAST CT CERVICAL SPINE WITHOUT CONTRAST TECHNIQUE: Multidetector CT imaging of the head, cervical spine, and maxillofacial structures were performed using the standard protocol  without intravenous contrast. Multiplanar CT image reconstructions of the cervical spine and maxillofacial structures were also generated. COMPARISON:  Head CT dated 07/10/2004. FINDINGS: CT HEAD FINDINGS Brain: There is generalized age related parenchymal volume loss with commensurate dilatation of the ventricles and sulci. Chronic small vessel ischemic changes are noted within the bilateral periventricular and subcortical white matter regions. No mass, hemorrhage, edema or other evidence of acute parenchymal abnormality. No extra-axial hemorrhage. Vascular: Chronic calcified atherosclerotic changes of the large vessels at the skull base. No unexpected hyperdense vessel. Skull: Normal. Negative for fracture or focal lesion. Other: None. CT MAXILLOFACIAL FINDINGS Osseous: Lower frontal bones are intact. No displaced nasal bone fracture. Osseous structures about the orbits appear intact and normally aligned bilaterally. Walls of the maxillary sinuses appear intact and normally aligned bilaterally. Bilateral zygomatic arches and pterygoid plates are intact. No mandible fracture or displacement is seen, although slight motion artifact limits characterization. Orbits: Negative. No traumatic or inflammatory finding. Sinuses: Mild mucosal thickening within the sphenoid sinus. No evidence of acute sinusitis. Soft tissues: Unremarkable.  No superficial soft tissue hematoma. CT CERVICAL SPINE FINDINGS Alignment: Levoscoliosis and straightening of the normal cervical spine lordosis, likely related to patient positioning and/or underlying degenerative change. No evidence of acute vertebral body subluxation. Skull base and vertebrae: No fracture line or displaced fracture fragment. Soft tissues and spinal canal: No prevertebral fluid or swelling. No visible canal hematoma. Disc levels: Degenerative spondylosis, mild to moderate in degree. Associated disc-osteophytic bulges at the C3-4 through C6-7 levels causing mild to  moderate central canal stenoses. Upper chest: Atherosclerosis of the thoracic aortic arch, incompletely imaged. No acute findings. Other:  Carotid atherosclerosis. IMPRESSION: 1. No acute intracranial abnormality. No intracranial mass, hemorrhage or edema. No skull fracture. Chronic small vessel ischemic changes within the white matter. 2. No facial bone fracture or displacement. 3. No fracture or acute subluxation within the cervical spine. Degenerative spondylosis of the cervical spine, mild to moderate in degree, as detailed above. Aortic Atherosclerosis (ICD10-I70.0). Electronically Signed   By: Bary Richard M.D.   On: 12/20/2019 18:19     Time coordinating discharge: Over 30 minutes    Lewie Chamber, MD  [facility] 12/25/2019, 11:52 AM Pager: Secure chat  If 7PM-7AM, please contact night-coverage [email or web address] Password [email or web address]

## 2019-12-25 NOTE — TOC Transition Note (Signed)
Transition of Care [facility]) - CM/SW Discharge Note   Patient Details  Name: Carolyn Bishop MRN: [id number] Date of Birth: 1932-09-15  Transition of Care [facility]) CM/SW Contact:  Bartholome Bill, RN Phone Number: 12/25/2019, 12:20 PM   Clinical Narrative:    [name] received for SNF from [facility] [phone]. Covid test back negative. Melody contacted to alert of dc today. Pt to go to room 101B at [facility] and RN to call report to [phone]. [facility] to be contacted for transport.   Final next level of care: Skilled Nursing Facility Barriers to Discharge: Insurance Authorization   Patient Goals and CMS Choice Patient states their goals for this hospitalization and ongoing recovery are:: To get better    Discharge Plan and Services   Discharge Planning Services: CM Consult

## 2019-12-26 ENCOUNTER — Ambulatory Visit: Payer: Self-pay | Admitting: Nurse Practitioner

## 2020-01-30 ENCOUNTER — Ambulatory Visit: Payer: Self-pay | Admitting: Nurse Practitioner

## 2020-02-01 ENCOUNTER — Ambulatory Visit: Payer: Self-pay | Admitting: Nurse Practitioner

## 2020-02-04 ENCOUNTER — Ambulatory Visit (INDEPENDENT_AMBULATORY_CARE_PROVIDER_SITE_OTHER): Payer: Medicare HMO | Admitting: Nurse Practitioner

## 2020-02-04 ENCOUNTER — Encounter: Payer: Self-pay | Admitting: Nurse Practitioner

## 2020-02-04 ENCOUNTER — Other Ambulatory Visit: Payer: Self-pay

## 2020-02-04 VITALS — BP 130/80 | HR 84 | Temp 97.7°F | Ht 63.0 in

## 2020-02-04 DIAGNOSIS — M170 Bilateral primary osteoarthritis of knee: Secondary | ICD-10-CM

## 2020-02-04 DIAGNOSIS — I700 Atherosclerosis of aorta: Secondary | ICD-10-CM

## 2020-02-04 DIAGNOSIS — E119 Type 2 diabetes mellitus without complications: Secondary | ICD-10-CM

## 2020-02-04 DIAGNOSIS — R531 Weakness: Secondary | ICD-10-CM

## 2020-02-04 DIAGNOSIS — Y92009 Unspecified place in unspecified non-institutional (private) residence as the place of occurrence of the external cause: Secondary | ICD-10-CM

## 2020-02-04 DIAGNOSIS — E785 Hyperlipidemia, unspecified: Secondary | ICD-10-CM | POA: Diagnosis not present

## 2020-02-04 DIAGNOSIS — W19XXXA Unspecified fall, initial encounter: Secondary | ICD-10-CM

## 2020-02-04 DIAGNOSIS — F0150 Vascular dementia without behavioral disturbance: Secondary | ICD-10-CM

## 2020-02-04 DIAGNOSIS — I10 Essential (primary) hypertension: Secondary | ICD-10-CM

## 2020-02-04 DIAGNOSIS — F419 Anxiety disorder, unspecified: Secondary | ICD-10-CM

## 2020-02-04 MED ORDER — MEMANTINE HCL 28 X 5 MG & 21 X 10 MG PO TABS: ORAL_TABLET | ORAL | 12 refills | Status: DC

## 2020-02-04 NOTE — Patient Instructions (Signed)
Will start namenda titration pack to help preserve memory   To take as directed and follow up in 6 weeks  To schedule AWV (can be via telephone visit 4 weeks)

## 2020-02-04 NOTE — Progress Notes (Signed)
Careteam: Patient Care Team: Sharon Seller, NP as PCP - General (Geriatric Medicine)  PLACE OF SERVICE:  [facility]  Advanced Directive information Does Patient Have a Medical Advance Directive?: No  No Known Allergies  Chief Complaint  Patient presents with  . Establish Care    New patient to establish care. Patient has concerns about shaking and nervousness. Discuss home health. Patient gets scared nad nervous when trying to move her. Patient states she has had COVID-19 vaccines and Flu vaccine. Home health worker will contct family to get exact dates.  . Best Practice Recommendations    COVID vaccine, Tetatnus/Tdap, Flu vaccine  . Quality Metric Gaps    Foot exam, Eye exam, Urine microalbumin     HPI: Patient is a 86 y.o. female to establish care.  Pt lives with her 3 elderly sister. She has a personal caregiver a few days a week. One of her sisters is also wheelchair bound but the other is ambulatory per caregiver. Their niece lives across the yard from from them and is in and out but she works. Her personal care assistant has been with them for 2 weeks and helps her with her bath and getting meals together. Pt no longer drives.  Her niece does her grocery shopping and gets her medication at the pharmacy. Her niece also helps with dinner.  Caregiver is helping to get her to appts.   She walks with rolling walker at times but in wheelchair today. She has had multiple falls and was recently hospitalized after fall. Last month was in SNF after her discharge from the hospital. Continues to have frequent falls per caregiver because she thinks she is able to get up and "help" She has OA in her left knee. Does not use anything for pain. Does not hurt all the time, "sometimes"    Hypertension- on amlodipine 5 mg daily   Hx of obesity but has lost over 130 lbs over a long time. Reports she has cut back on her meals and ate more salads.   Vit D Def- not on supplement  DM-  diet controlled at this time.   Reports she has had flu shot 1 month ago.   Reports she gets nervous when things do not work right. Reports it is not "all the time" reports it is not even weekly but does get upset.  Caregiver reports she is nervous a lot, increase in anxiety.  Reports memory has declined.  She tries to get out of bed by herself but really needs help and then falls in the floor. Tries to do all for herself.  She can pull up if sometime helps but needs standby assist.    Review of Systems:  Review of Systems  Constitutional: Negative for chills, fever and weight loss.  HENT: Negative for tinnitus.   Respiratory: Negative for cough, sputum production and shortness of breath.   Cardiovascular: Negative for chest pain, palpitations and leg swelling.  Gastrointestinal: Negative for abdominal pain, constipation, diarrhea and heartburn.  Genitourinary: Negative for dysuria, frequency and urgency.  Musculoskeletal: Negative for back pain, falls, joint pain and myalgias.  Skin: Negative.   Neurological: Negative for dizziness and headaches.  Psychiatric/Behavioral: Positive for memory loss. Negative for depression. The patient is nervous/anxious. The patient does not have insomnia.     Past Medical History:  Diagnosis Date  . Arthritis   . Diabetes (HCC)   . HLD (hyperlipidemia)   . HTN (hypertension)   . Obesity   .  Palpitations   . Vitamin D deficiency    Past Surgical History:  Procedure Laterality Date  . ABDOMINAL HYSTERECTOMY    . BIOPSY  10/29/2017   Procedure: BIOPSY;  Surgeon: Kerin Salen, MD;  Location: [facility];  Service: Gastroenterology;;  . ESOPHAGOGASTRODUODENOSCOPY (EGD) WITH PROPOFOL N/A 10/29/2017   Procedure: ESOPHAGOGASTRODUODENOSCOPY (EGD) WITH PROPOFOL;  Surgeon: Kerin Salen, MD;  Location: [facility];  Service: Gastroenterology;  Laterality: N/A;   Social History:   reports that she has never smoked. She has never used smokeless tobacco.  She reports that she does not drink alcohol and does not use drugs.  Family History  Problem Relation Age of Onset  . Hypertension Mother   . Lung cancer Father   . Heart Problems Sister   . Kidney failure Sister   . Diabetes Sister   . Congestive Heart Failure Sister        CHF    Medications: Patient's Medications  New Prescriptions   No medications on file  Previous Medications   AMLODIPINE (NORVASC) 5 MG TABLET    Take 1 tablet (5 mg total) by mouth daily.  Modified Medications   No medications on file  Discontinued Medications   No medications on file    Physical Exam:  Vitals:   02/04/20 1315  BP: 130/80  Pulse: 84  Temp: 97.7 F (36.5 C)  TempSrc: Temporal  SpO2: 92%  Height:  (1.6 m)   Body mass index is 24.45 kg/m. Wt Readings from Last 3 Encounters:  12/20/19 138 lb (62.6 kg)  10/31/19 139 lb 1.8 oz (63.1 kg)  11/07/17 160 lb (72.6 kg)    Physical Exam Constitutional:      General: She is not in acute distress.    Appearance: She is well-developed. She is not diaphoretic.  HENT:     Head: Normocephalic and atraumatic.     Mouth/Throat:     Pharynx: No oropharyngeal exudate.  Eyes:     Conjunctiva/sclera: Conjunctivae normal.     Pupils: Pupils are equal, round, and reactive to light.  Cardiovascular:     Rate and Rhythm: Normal rate and regular rhythm.     Heart sounds: Normal heart sounds.  Pulmonary:     Effort: Pulmonary effort is normal.     Breath sounds: Normal breath sounds.  Abdominal:     General: Bowel sounds are normal.     Palpations: Abdomen is soft.  Musculoskeletal:        General: No tenderness.     Cervical back: Normal range of motion and neck supple.  Skin:    General: Skin is warm and dry.  Neurological:     Mental Status: She is alert.     Motor: Weakness present.     Gait: Gait abnormal (uses wheelchair).  Psychiatric:        Mood and Affect: Mood normal.        Behavior: Behavior normal.     Labs  reviewed: Basic Metabolic Panel: Recent Labs    10/28/19 1941 10/29/19 0447 10/30/19 0615 10/30/19 1238 11/01/19 0322 12/20/19 1637 12/21/19 0030 12/21/19 0031 12/21/19 0545 12/23/19 0558 12/24/19 0541 12/25/19 0613  NA   < >  --  142  --    < >  --   --   --    < > 141 140 140  K   < >  --  3.8  --    < >  --   --   --    < >  3.8 4.1 3.8  CL   < >  --  109  --    < >  --   --   --    < > 109 106 106  CO2   < >  --  23  --    < >  --   --   --    < > GLUCOSE   < >  --  76  --    < >  --   --   --    < > 123* 84 101*  BUN   < >  --  25*  --    < >  --   --   --    < > CREATININE   < >  --  0.98  --    < >   < >  --  0.69   < > 0.70 0.65 0.69  CALCIUM   < >  --  9.2  --    < >  --   --   --    < > 9.1 9.0 9.1  MG  --  1.8 1.7  --   --   --   --  1.7  --   --   --   --   PHOS  --  3.5  --   --   --   --   --   --   --   --   --   --   TSH  --   --   --  0.949  --   --  1.061  --   --   --   --   --    < > = values in this interval not displayed.   Liver Function Tests: Recent Labs    10/28/19 1941 12/20/19 1637  AST 27 28  ALT 21 15  ALKPHOS 65 60  BILITOT 0.7 1.0  PROT 7.1 6.7  ALBUMIN 3.6 3.4*   No results for input(s): LIPASE, AMYLASE in the last 8760 hours. No results for input(s): AMMONIA in the last 8760 hours. CBC: Recent Labs    10/28/19 1941 10/29/19 0447 12/20/19 1637 12/21/19 0031 12/23/19 0558 12/24/19 0541 12/25/19 0813  WBC 5.3   < > 5.5   < > 4.8 4.5 5.1  NEUTROABS 3.8  --  4.2  --   --   --   --   HGB 11.1*   < > 11.6*   < > 11.2* 9.7* 11.1*  HCT 34.8*   < > 35.8*   < > 34.3* 30.3* 35.2*  MCV 94.3   < > 92.3   < > 92.7 95.6 93.4  PLT 197   < > 207   < > 204 185 208   < > = values in this interval not displayed.   Lipid Panel: No results for input(s): CHOL, HDL, LDLCALC, TRIG, CHOLHDL, LDLDIRECT in the last 8760 hours. TSH: Recent Labs    10/30/19 1238 12/21/19 0030  TSH 0.949 1.061   A1C: Lab Results  Component  Value Date   HGBA1C 6.3 (H) 12/21/2019   DG Chest 2 View  Result Date: 12/20/2019 CLINICAL DATA:  Fall EXAM: CHEST - 2 VIEW COMPARISON:  10/28/2019 FINDINGS: Cardiomegaly.  Lungs clear.  No effusions or acute bony abnormality. IMPRESSION: Cardiomegaly.  No active disease. Electronically Signed   By: Charlett Nose M.D.   On:  12/20/2019 18:09   DG Shoulder Right  Result Date: 12/20/2019 CLINICAL DATA:  Fall, shoulder pain EXAM: RIGHT SHOULDER - 2+ VIEW COMPARISON:  None. FINDINGS: Advanced arthritic changes in the right wrist. Complete loss of the subacromial space. No fracture or dislocation. IMPRESSION: No acute bony abnormality.  Advanced arthritic changes. Electronically Signed   By: Charlett Nose M.D.   On: 12/20/2019 18:08   DG Wrist Complete Right  Result Date: 12/20/2019 CLINICAL DATA:  Fall, wrist pain EXAM: RIGHT WRIST - COMPLETE 3+ VIEW COMPARISON:  None. FINDINGS: Advanced degenerative changes at the radiocarpal joint. No acute bony abnormality. Specifically, no fracture, subluxation, or dislocation. IMPRESSION: No acute bony abnormality. Electronically Signed   By: Charlett Nose M.D.   On: 12/20/2019 18:08   DG Knee 2 Views Left  Result Date: 12/20/2019 CLINICAL DATA:  Fall, left knee pain EXAM: LEFT KNEE - 1-2 VIEW COMPARISON:  None. FINDINGS: Moderate tricompartment degenerative changes with joint space narrowing and spurring. No acute bony abnormality. Specifically, no fracture, subluxation, or dislocation. No joint effusion. IMPRESSION: Moderate degenerative changes.  No acute bony abnormality. Electronically Signed   By: Charlett Nose M.D.   On: 12/20/2019 18:09   CT Head Wo Contrast  Result Date: 12/20/2019 CLINICAL DATA:  Fall last night, found down. EXAM: CT HEAD WITHOUT CONTRAST CT MAXILLOFACIAL WITHOUT CONTRAST CT CERVICAL SPINE WITHOUT CONTRAST TECHNIQUE: Multidetector CT imaging of the head, cervical spine, and maxillofacial structures were performed using the standard  protocol without intravenous contrast. Multiplanar CT image reconstructions of the cervical spine and maxillofacial structures were also generated. COMPARISON:  Head CT dated 07/10/2004. FINDINGS: CT HEAD FINDINGS Brain: There is generalized age related parenchymal volume loss with commensurate dilatation of the ventricles and sulci. Chronic small vessel ischemic changes are noted within the bilateral periventricular and subcortical white matter regions. No mass, hemorrhage, edema or other evidence of acute parenchymal abnormality. No extra-axial hemorrhage. Vascular: Chronic calcified atherosclerotic changes of the large vessels at the skull base. No unexpected hyperdense vessel. Skull: Normal. Negative for fracture or focal lesion. Other: None. CT MAXILLOFACIAL FINDINGS Osseous: Lower frontal bones are intact. No displaced nasal bone fracture. Osseous structures about the orbits appear intact and normally aligned bilaterally. Walls of the maxillary sinuses appear intact and normally aligned bilaterally. Bilateral zygomatic arches and pterygoid plates are intact. No mandible fracture or displacement is seen, although slight motion artifact limits characterization. Orbits: Negative. No traumatic or inflammatory finding. Sinuses: Mild mucosal thickening within the sphenoid sinus. No evidence of acute sinusitis. Soft tissues: Unremarkable.  No superficial soft tissue hematoma. CT CERVICAL SPINE FINDINGS Alignment: Levoscoliosis and straightening of the normal cervical spine lordosis, likely related to patient positioning and/or underlying degenerative change. No evidence of acute vertebral body subluxation. Skull base and vertebrae: No fracture line or displaced fracture fragment. Soft tissues and spinal canal: No prevertebral fluid or swelling. No visible canal hematoma. Disc levels: Degenerative spondylosis, mild to moderate in degree. Associated disc-osteophytic bulges at the C3-4 through C6-7 levels causing mild  to moderate central canal stenoses. Upper chest: Atherosclerosis of the thoracic aortic arch, incompletely imaged. No acute findings. Other: Carotid atherosclerosis. IMPRESSION: 1. No acute intracranial abnormality. No intracranial mass, hemorrhage or edema. No skull fracture. Chronic small vessel ischemic changes within the white matter. 2. No facial bone fracture or displacement. 3. No fracture or acute subluxation within the cervical spine. Degenerative spondylosis of the cervical spine, mild to moderate in degree, as detailed above. Aortic Atherosclerosis (ICD10-I70.0). Electronically Signed  By: Bary Richard M.D.   On: 12/20/2019 18:19   CT Cervical Spine Wo Contrast  Result Date: 12/20/2019 CLINICAL DATA:  Fall last night, found down. EXAM: CT HEAD WITHOUT CONTRAST CT MAXILLOFACIAL WITHOUT CONTRAST CT CERVICAL SPINE WITHOUT CONTRAST TECHNIQUE: Multidetector CT imaging of the head, cervical spine, and maxillofacial structures were performed using the standard protocol without intravenous contrast. Multiplanar CT image reconstructions of the cervical spine and maxillofacial structures were also generated. COMPARISON:  Head CT dated 07/10/2004. FINDINGS: CT HEAD FINDINGS Brain: There is generalized age related parenchymal volume loss with commensurate dilatation of the ventricles and sulci. Chronic small vessel ischemic changes are noted within the bilateral periventricular and subcortical white matter regions. No mass, hemorrhage, edema or other evidence of acute parenchymal abnormality. No extra-axial hemorrhage. Vascular: Chronic calcified atherosclerotic changes of the large vessels at the skull base. No unexpected hyperdense vessel. Skull: Normal. Negative for fracture or focal lesion. Other: None. CT MAXILLOFACIAL FINDINGS Osseous: Lower frontal bones are intact. No displaced nasal bone fracture. Osseous structures about the orbits appear intact and normally aligned bilaterally. Walls of the maxillary  sinuses appear intact and normally aligned bilaterally. Bilateral zygomatic arches and pterygoid plates are intact. No mandible fracture or displacement is seen, although slight motion artifact limits characterization. Orbits: Negative. No traumatic or inflammatory finding. Sinuses: Mild mucosal thickening within the sphenoid sinus. No evidence of acute sinusitis. Soft tissues: Unremarkable.  No superficial soft tissue hematoma. CT CERVICAL SPINE FINDINGS Alignment: Levoscoliosis and straightening of the normal cervical spine lordosis, likely related to patient positioning and/or underlying degenerative change. No evidence of acute vertebral body subluxation. Skull base and vertebrae: No fracture line or displaced fracture fragment. Soft tissues and spinal canal: No prevertebral fluid or swelling. No visible canal hematoma. Disc levels: Degenerative spondylosis, mild to moderate in degree. Associated disc-osteophytic bulges at the C3-4 through C6-7 levels causing mild to moderate central canal stenoses. Upper chest: Atherosclerosis of the thoracic aortic arch, incompletely imaged. No acute findings. Other: Carotid atherosclerosis. IMPRESSION: 1. No acute intracranial abnormality. No intracranial mass, hemorrhage or edema. No skull fracture. Chronic small vessel ischemic changes within the white matter. 2. No facial bone fracture or displacement. 3. No fracture or acute subluxation within the cervical spine. Degenerative spondylosis of the cervical spine, mild to moderate in degree, as detailed above. Aortic Atherosclerosis (ICD10-I70.0). Electronically Signed   By: Bary Richard M.D.   On: 12/20/2019 18:19   ECHOCARDIOGRAM COMPLETE  Result Date: 12/21/2019    ECHOCARDIOGRAM REPORT   Patient Name:   ALEKA TWITTY Date of Exam: 12/21/2019 Medical Rec #:  [id number]    Height:       63.0 in Accession #:    [id number]   Weight:       138.0 lb Date of Birth:  1932/07/15    BSA:          1.652 m Patient Age:    87 years      BP:           164/69 mmHg Patient Gender: F            HR:           80 bpm. Exam Location:  Inpatient Procedure: 2D Echo, 3D Echo, Color Doppler and Cardiac Doppler Indications:    R55 Syncope  History:        Patient has prior history of Echocardiogram examinations, most  recent 10/28/2017. Risk Factors:Hypertension, Diabetes and                 Dyslipidemia.  Sonographer:    Irving Burton Senior RDCS Referring Phys: [id number] RAVI PAHWANI IMPRESSIONS  1. Left ventricular ejection fraction, by estimation, is 60 to 65%. The left ventricle has normal function. The left ventricle has no regional wall motion abnormalities. There is mild left ventricular hypertrophy of the basal-septal segment. Left ventricular diastolic parameters are consistent with Grade I diastolic dysfunction (impaired relaxation).  2. Right ventricular systolic function is normal. The right ventricular size is normal. There is normal pulmonary artery systolic pressure.  3. Left atrial size was mildly dilated.  4. The mitral valve is normal in structure. No evidence of mitral valve regurgitation. No evidence of mitral stenosis.  5. The aortic valve is normal in structure. Aortic valve regurgitation is not visualized. No aortic stenosis is present.  6. The inferior vena cava is normal in size with <50% respiratory variability, suggesting right atrial pressure of 8 mmHg. FINDINGS  Left Ventricle: Left ventricular ejection fraction, by estimation, is 60 to 65%. The left ventricle has normal function. The left ventricle has no regional wall motion abnormalities. The left ventricular internal cavity size was normal in size. There is  mild left ventricular hypertrophy of the basal-septal segment. Left ventricular diastolic parameters are consistent with Grade I diastolic dysfunction (impaired relaxation). Indeterminate filling pressures. Right Ventricle: The right ventricular size is normal. No increase in right ventricular wall thickness. Right  ventricular systolic function is normal. There is normal pulmonary artery systolic pressure. The tricuspid regurgitant velocity is 2.03 m/s, and  with an assumed right atrial pressure of 8 mmHg, the estimated right ventricular systolic pressure is 24.5 mmHg. Left Atrium: Left atrial size was mildly dilated. Right Atrium: Right atrial size was normal in size. Pericardium: There is no evidence of pericardial effusion. Mitral Valve: The mitral valve is normal in structure. Normal mobility of the mitral valve leaflets. No evidence of mitral valve regurgitation. No evidence of mitral valve stenosis. Tricuspid Valve: The tricuspid valve is normal in structure. Tricuspid valve regurgitation is not demonstrated. No evidence of tricuspid stenosis. Aortic Valve: The aortic valve is normal in structure. Aortic valve regurgitation is not visualized. No aortic stenosis is present. Pulmonic Valve: The pulmonic valve was normal in structure. Pulmonic valve regurgitation is not visualized. No evidence of pulmonic stenosis. Aorta: The aortic root is normal in size and structure. Venous: The inferior vena cava is normal in size with less than 50% respiratory variability, suggesting right atrial pressure of 8 mmHg. IAS/Shunts: No atrial level shunt detected by color flow Doppler.  LEFT VENTRICLE PLAX 2D LVIDd:         3.80 cm  Diastology LVIDs:         2.50 cm  LV e' lateral:   5.98 cm/s LV PW:         0.80 cm  LV E/e' lateral: 12.5 LV IVS:        1.40 cm  LV e' medial:    6.74 cm/s LVOT diam:     1.90 cm  LV E/e' medial:  11.1 LV SV:         54 LV SV Index:   33 LVOT Area:     2.84 cm                          3D Volume EF:  3D EF:        62 %                         LV EDV:       98 ml                         LV ESV:       37 ml                         LV SV:        61 ml RIGHT VENTRICLE RV S prime:     11.90 cm/s TAPSE (M-mode): 1.9 cm LEFT ATRIUM             Index       RIGHT ATRIUM           Index LA diam:         3.60 cm 2.18 cm/m  RA Area:     10.50 cm LA Vol (A2C):   50.5 ml 30.58 ml/m RA Volume:   23.60 ml  14.29 ml/m LA Vol (A4C):   51.3 ml 31.06 ml/m LA Biplane Vol: 54.0 ml 32.70 ml/m  AORTIC VALVE LVOT Vmax:   92.00 cm/s LVOT Vmean:  67.600 cm/s LVOT VTI:    0.191 m  AORTA Ao Root diam: 3.20 cm MITRAL VALVE                TRICUSPID VALVE MV Area (PHT): 3.03 cm     TR Peak grad:   16.5 mmHg MV Decel Time: 250 msec     TR Vmax:        203.00 cm/s MV E velocity: 75.00 cm/s MV A velocity: 125.00 cm/s  SHUNTS MV E/A ratio:  0.60         Systemic VTI:  0.19 m                             Systemic Diam: 1.90 cm Rachelle HoraMihai Croitoru MD Electronically signed by Thurmon FairMihai Croitoru MD Signature Date/Time: 12/21/2019/2:09:17 PM    Final    CT Maxillofacial WO CM  Result Date: 12/20/2019 CLINICAL DATA:  Fall last night, found down. EXAM: CT HEAD WITHOUT CONTRAST CT MAXILLOFACIAL WITHOUT CONTRAST CT CERVICAL SPINE WITHOUT CONTRAST TECHNIQUE: Multidetector CT imaging of the head, cervical spine, and maxillofacial structures were performed using the standard protocol without intravenous contrast. Multiplanar CT image reconstructions of the cervical spine and maxillofacial structures were also generated. COMPARISON:  Head CT dated 07/10/2004. FINDINGS: CT HEAD FINDINGS Brain: There is generalized age related parenchymal volume loss with commensurate dilatation of the ventricles and sulci. Chronic small vessel ischemic changes are noted within the bilateral periventricular and subcortical white matter regions. No mass, hemorrhage, edema or other evidence of acute parenchymal abnormality. No extra-axial hemorrhage. Vascular: Chronic calcified atherosclerotic changes of the large vessels at the skull base. No unexpected hyperdense vessel. Skull: Normal. Negative for fracture or focal lesion. Other: None. CT MAXILLOFACIAL FINDINGS Osseous: Lower frontal bones are intact. No displaced nasal bone fracture. Osseous structures about the  orbits appear intact and normally aligned bilaterally. Walls of the maxillary sinuses appear intact and normally aligned bilaterally. Bilateral zygomatic arches and pterygoid plates are intact. No mandible fracture or displacement is seen, although slight motion artifact limits characterization. Orbits: Negative. No traumatic or inflammatory finding. Sinuses:  Mild mucosal thickening within the sphenoid sinus. No evidence of acute sinusitis. Soft tissues: Unremarkable.  No superficial soft tissue hematoma. CT CERVICAL SPINE FINDINGS Alignment: Levoscoliosis and straightening of the normal cervical spine lordosis, likely related to patient positioning and/or underlying degenerative change. No evidence of acute vertebral body subluxation. Skull base and vertebrae: No fracture line or displaced fracture fragment. Soft tissues and spinal canal: No prevertebral fluid or swelling. No visible canal hematoma. Disc levels: Degenerative spondylosis, mild to moderate in degree. Associated disc-osteophytic bulges at the C3-4 through C6-7 levels causing mild to moderate central canal stenoses. Upper chest: Atherosclerosis of the thoracic aortic arch, incompletely imaged. No acute findings. Other: Carotid atherosclerosis. IMPRESSION: 1. No acute intracranial abnormality. No intracranial mass, hemorrhage or edema. No skull fracture. Chronic small vessel ischemic changes within the white matter. 2. No facial bone fracture or displacement. 3. No fracture or acute subluxation within the cervical spine. Degenerative spondylosis of the cervical spine, mild to moderate in degree, as detailed above. Aortic Atherosclerosis (ICD10-I70.0). Electronically Signed   By: Bary Richard M.D.   On: 12/20/2019 18:19   MMSE - Mini Mental State Exam 02/04/2020  Orientation to time 1  Orientation to Place 1  Registration 1  Attention/ Calculation 5  Recall 1  Language- name 2 objects 0  Language- repeat 1  Language- follow 3 step command 3    Language- read & follow direction 0  Write a sentence 0  Copy design 0  Total score 13   Assessment/Plan 1. Fall in home, initial encounter -ongoing falls at home. Has caregivers that come in to help her but she continues to try to do attempt to get out of bed by herself and get around her house without assistive devices resulting in falls.  - Ambulatory referral to Home Health  2. Hyperlipidemia, unspecified hyperlipidemia type - COMPLETE METABOLIC PANEL WITH GFR - Lipid Panel  3. Primary osteoarthritis of both knees Ongoing, denies any acute pain at this time.  - Ambulatory referral to Home Health  4. Weakness -progressive weakness with ongoing falls, she has walker and assistive device in home to help. Memory loss - Ambulatory referral to Home Health  5. Dementia  MMSE of 13 today, she clearly has significant memory deficits and per CT scan with Chronic small vessel ischemic changes within the white matter. Supervision is clearly needed within the home.  -will start her on namenda titration pack at this time to help preserve memory and function as able. - CBC with Differential/Platelet - COMPLETE METABOLIC PANEL WITH GFR  6. Diabetes mellitus without complication (HCC) -diet controlled.  - Hemoglobin A1c at goal on last labs.  7. Aortic atherosclerosis (HCC) Noted on recent imaging.   8. Anxiety Noted by caregiver and pt, starting namanda at this time. Appears anxiety related to memory loss, also would benefit from SSRI but will wait to see how she is tolerating namenda before starting additional medication.   9. htn Controlled on norvasc  Next appt: awv in 1 month 6 week follow up on memory, namenda, and mood.  Janene Harvey. Biagio Borg  [facility] [phone]

## 2020-02-05 ENCOUNTER — Other Ambulatory Visit: Payer: Self-pay | Admitting: *Deleted

## 2020-02-05 LAB — CBC WITH DIFFERENTIAL/PLATELET
Absolute Monocytes: 543 cells/uL (ref 200–950)
Basophils Absolute: 31 cells/uL (ref 0–200)
Basophils Relative: 0.5 %
Eosinophils Absolute: 18 cells/uL (ref 15–500)
Eosinophils Relative: 0.3 %
HCT: 39.2 % (ref 35.0–45.0)
Hemoglobin: 13 g/dL (ref 11.7–15.5)
Lymphs Abs: 610 cells/uL — ABNORMAL LOW (ref 850–3900)
MCH: 29.9 pg (ref 27.0–33.0)
MCHC: 33.2 g/dL (ref 32.0–36.0)
MCV: 90.1 fL (ref 80.0–100.0)
MPV: 10.8 fL (ref 7.5–12.5)
Monocytes Relative: 8.9 %
Neutro Abs: 4898 cells/uL (ref 1500–7800)
Neutrophils Relative %: 80.3 %
Platelets: 256 10*3/uL (ref 140–400)
RBC: 4.35 10*6/uL (ref 3.80–5.10)
RDW: 11.9 % (ref 11.0–15.0)
Total Lymphocyte: 10 %
WBC: 6.1 10*3/uL (ref 3.8–10.8)

## 2020-02-05 LAB — LIPID PANEL
Cholesterol: 160 mg/dL (ref <200)
HDL: 57 mg/dL (ref >=50)
LDL Cholesterol (Calc): 86 mg/dL (calc)
Non-HDL Cholesterol (Calc): 103 mg/dL (calc) (ref <130)
Total CHOL/HDL Ratio: 2.8 (calc) (ref <5.0)
Triglycerides: 81 mg/dL (ref <150)

## 2020-02-05 LAB — COMPLETE METABOLIC PANEL WITH GFR
AG Ratio: 1.1 (calc) (ref 1.0–2.5)
ALT: 10 U/L (ref 6–29)
AST: 16 U/L (ref 10–35)
Albumin: 3.7 g/dL (ref 3.6–5.1)
Alkaline phosphatase (APISO): 79 U/L (ref 37–153)
BUN/Creatinine Ratio: 33 (calc) — ABNORMAL HIGH (ref 6–22)
BUN: 29 mg/dL — ABNORMAL HIGH (ref 7–25)
CO2: 17 mmol/L — ABNORMAL LOW (ref 20–32)
Calcium: 10 mg/dL (ref 8.6–10.4)
Chloride: 107 mmol/L (ref 98–110)
Creat: 0.87 mg/dL (ref 0.60–0.88)
GFR, Est African American: 70 mL/min/{1.73_m2} (ref >=60)
GFR, Est Non African American: 61 mL/min/{1.73_m2} (ref >=60)
Globulin: 3.5 g/dL (calc) (ref 1.9–3.7)
Glucose, Bld: 281 mg/dL — ABNORMAL HIGH (ref 65–99)
Potassium: 4.5 mmol/L (ref 3.5–5.3)
Sodium: 143 mmol/L (ref 135–146)
Total Bilirubin: 0.3 mg/dL (ref 0.2–1.2)
Total Protein: 7.2 g/dL (ref 6.1–8.1)

## 2020-02-05 LAB — HEMOGLOBIN A1C
Hgb A1c MFr Bld: 6.5 % of total Hgb — ABNORMAL HIGH (ref <5.7)
Mean Plasma Glucose: 140 (calc)
eAG (mmol/L): 7.7 (calc)

## 2020-02-05 MED ORDER — AMLODIPINE BESYLATE 5 MG PO TABS: 5.0000 mg | ORAL_TABLET | Freq: Every day | ORAL | 1 refills | Status: DC

## 2020-02-05 NOTE — Telephone Encounter (Signed)
Received refill request from [employer]

## 2020-02-06 ENCOUNTER — Telehealth: Payer: Self-pay

## 2020-02-06 NOTE — Telephone Encounter (Signed)
Team assistant at [facility] states that patient start of care will be tomorrow 02/07/2020 at 9:30am.

## 2020-02-06 NOTE — Telephone Encounter (Signed)
Noted  

## 2020-02-11 ENCOUNTER — Telehealth: Payer: Self-pay

## 2020-02-11 DIAGNOSIS — F0150 Vascular dementia without behavioral disturbance: Secondary | ICD-10-CM

## 2020-02-11 MED ORDER — MEMANTINE HCL 28 X 5 MG & 21 X 10 MG PO TABS: ORAL_TABLET | ORAL | 12 refills | Status: DC

## 2020-02-11 NOTE — Telephone Encounter (Signed)
Ms. Daphine Deutscher called back and stated that [facility] would not approve medication, [employer] denied prior authorization. She is wanting the Rx faxed to [facility] instead of [facility]. Rx Faxed.

## 2020-02-11 NOTE — Telephone Encounter (Addendum)
Patient's sister, Carolyn Bishop, called demanding to talk to Carolyn Bishop. I told her she was with a patient and she stated she would wait.  I told her the process we usually use to take messages from patients and she told me that Carolyn Bishop had been sent by Carolyn Bishop on her own to the wrong pharmacy and that the pharmacy would not release the medication.  I told her that Carolyn Bishop probably did not personally send the Rx and she was insistent that no one else could have sent the script. I told her that the CMAs could also send medication. She did have an Rx sent to [facility] for Carolyn Bishop and then it looks like the next day an Rx was sent to [facility] for Norvasc 5 mg.    We do not have a DPR to speak with Carolyn Bishop. I called the niece listed on the DPR, Carolyn Bishop, at [phone]. She stated she didn't know the whole story, but that she would pick up her medicine from [facility] so that it was not further delayed. I told her that Carolyn Bishop was very upset and Carolyn Bishop said she would take care of it.  Carolyn Bishop stated they received the Norvasc today from [facility].

## 2020-02-20 ENCOUNTER — Telehealth: Payer: Self-pay | Admitting: *Deleted

## 2020-02-20 NOTE — Telephone Encounter (Signed)
Noted, would make sure to push fluids, tylenol 325 mg 2 tablets every 6 hours as needed for pain or fever, max of 3000 mg of tylenol from all sources. To make sure she is drinking and offer oral intake. If she is not eating or drinking she will need to go to the ED.  We could do a video visit tomorrow if needed

## 2020-02-20 NOTE — Telephone Encounter (Signed)
Carolyn Bishop, Nurse with [facility] called and stated that she is in the home with patient and she is running a fever of 101.5 and altered mental status and Vertigo.   Nurse is going to do a In and Out Cath to collect urine for U/A and Cx. (verbal order given since nurse is in home and no available appointments in office.)  Daughter gave patient 2 Tylenol. Patient is not complaining of anyother symptoms.

## 2020-02-20 NOTE — Telephone Encounter (Signed)
Judeth Cornfield, Nurse notified and agreed.

## 2020-02-22 ENCOUNTER — Telehealth: Payer: Self-pay

## 2020-02-22 NOTE — Telephone Encounter (Signed)
Incoming call received form patients sister Mellanie requesting results of patients urine culture and questioning if a rx will be sent in.  I checked with Synetta Fail May, CMA to see if she had results and I checked Sharon Seller, NP review and sign folder and was unable to locate results.  Mellanie is aware I will call [facility] and request results.   Refer to phone note dated 02/20/2020. I called Judeth Cornfield, nurse with [facility]. Judeth Cornfield was unavailable and voicemail was full.   I called the main line for [facility] [phone] and the call would not go through, gave a busy signal. I tried multiple times to confirm correct number was dialed.   I tried Judeth Cornfield again and she stated she was told that results were faxed. Judeth Cornfield will results refaxed.  Awaiting fax

## 2020-02-25 NOTE — Telephone Encounter (Signed)
Please advise if urine culture results were received

## 2020-02-25 NOTE — Telephone Encounter (Signed)
Not at this time.

## 2020-02-26 ENCOUNTER — Telehealth: Payer: Self-pay

## 2020-02-26 NOTE — Telephone Encounter (Signed)
Carolyn Bishop, niece to patient called to question if the medications prescribed (namenda & norvasc) can cause increased agitation. Patients caregiver has mentioned that patient is more agitated and irritated. Caregiver is not able to control patient. Patients caregiver looked up both medications and it was listed that agitation can be a side effect.    PT evaluated patient and stated that they are not sure how they can assist patient for she is not mobile without assistance. Patient lies in bed majority of the day and when moved she is lifted by family or caregiver.  Due to dementia patient freaks out, thinks the room is moving, and begins yelling out to people who are not present when moved. All PT was able to do was obtain vital.  Per Carolyn Bishop, PT was questioning what Carolyn Seller, Carolyn Bishop expectations are with therapy.  Carolyn Bishop is aware Carolyn Seller, Carolyn Bishop is out of office this PM and we will provide response tomorrow.  Please review and advise

## 2020-02-26 NOTE — Telephone Encounter (Signed)
I called [facility] and the number continues to give a busy signal.   I sent a fax marked URGENT on the coversheet to [facility] @ [phone], requesting results of urine culture.  Awaiting fax

## 2020-02-26 NOTE — Telephone Encounter (Signed)
Spoke with Mellanie, patients niece and she has not heard anything regarding the results of urine culture either.  Mellanie states she asked the PT during their home visit if they knew anything about results and the response was no.

## 2020-02-27 NOTE — Telephone Encounter (Signed)
Spoke with Carolyn Bishop who states the [facility] nurse came out today and told her urine culture was normal.   I verbally told Sharon Seller, NP

## 2020-02-27 NOTE — Telephone Encounter (Signed)
Spoke with patients daughter Mellanie and she verbalized understanding of Carolyn Bishop's response. Mellanie states she spoke with the [facility] Nurse today and she had also mentioned facility placement as an option.   Mellanie plans to contact a Child psychotherapist to look into getting patient approved for medicare to help on the financial end of placement.   Medication list update to reflect d/c'ing Namenda

## 2020-02-27 NOTE — Telephone Encounter (Signed)
Namenda can cause agitation in some, we can stop this medication to see if this improves.  Pt came to office alone for her first appt. She had caregiver with her and stated that she was very unstable on her feet with frequent falls. If she is not able to transfer safely it may be a situation that she needs to look into 24/7 care or SNF.

## 2020-03-04 ENCOUNTER — Encounter: Payer: Medicare HMO | Admitting: Nurse Practitioner

## 2020-03-04 ENCOUNTER — Telehealth: Payer: Self-pay

## 2020-03-04 ENCOUNTER — Other Ambulatory Visit: Payer: Self-pay

## 2020-03-04 NOTE — Telephone Encounter (Signed)
Made 3 unsuccessful attempts to reach patient to start AWV, patient never answered. Phone just kept ringing with no voicemail to leave a message.Patient will need to reschedule this appointment.  1st attempt was made at 11:06 am 2nd attempt was made at 11:23 am 3rd and final attempt was made at 11:34 am

## 2020-03-05 NOTE — Progress Notes (Signed)
This encounter was created in error - please disregard.

## 2020-03-10 ENCOUNTER — Other Ambulatory Visit: Payer: Self-pay

## 2020-03-10 ENCOUNTER — Emergency Department (HOSPITAL_BASED_OUTPATIENT_CLINIC_OR_DEPARTMENT_OTHER)
Admit: 2020-03-10 | Discharge: 2020-03-10 | Disposition: A | Discharge status: Home / Self Care | Payer: Medicare HMO | Attending: Emergency Medicine | Admitting: Emergency Medicine

## 2020-03-10 ENCOUNTER — Inpatient Hospital Stay (HOSPITAL_COMMUNITY)
Admission: EM | Admit: 2020-03-10 | Discharge: 2020-03-17 | DRG: 299 | Disposition: A | Discharge status: Skilled Nursing Facility | Payer: Medicare HMO | Attending: Internal Medicine | Admitting: Internal Medicine

## 2020-03-10 ENCOUNTER — Emergency Department (HOSPITAL_COMMUNITY): Payer: Medicare HMO

## 2020-03-10 DIAGNOSIS — I110 Hypertensive heart disease with heart failure: Secondary | ICD-10-CM | POA: Diagnosis present

## 2020-03-10 DIAGNOSIS — D649 Anemia, unspecified: Secondary | ICD-10-CM | POA: Diagnosis present

## 2020-03-10 DIAGNOSIS — L8990 Pressure ulcer of unspecified site, unspecified stage: Secondary | ICD-10-CM | POA: Diagnosis present

## 2020-03-10 DIAGNOSIS — I82409 Acute embolism and thrombosis of unspecified deep veins of unspecified lower extremity: Secondary | ICD-10-CM | POA: Diagnosis present

## 2020-03-10 DIAGNOSIS — E538 Deficiency of other specified B group vitamins: Secondary | ICD-10-CM | POA: Diagnosis present

## 2020-03-10 DIAGNOSIS — B9620 Unspecified Escherichia coli [E. coli] as the cause of diseases classified elsewhere: Secondary | ICD-10-CM | POA: Diagnosis present

## 2020-03-10 DIAGNOSIS — L89152 Pressure ulcer of sacral region, stage 2: Secondary | ICD-10-CM | POA: Diagnosis present

## 2020-03-10 DIAGNOSIS — R609 Edema, unspecified: Secondary | ICD-10-CM | POA: Diagnosis not present

## 2020-03-10 DIAGNOSIS — I82411 Acute embolism and thrombosis of right femoral vein: Principal | ICD-10-CM | POA: Diagnosis present

## 2020-03-10 DIAGNOSIS — Z79899 Other long term (current) drug therapy: Secondary | ICD-10-CM

## 2020-03-10 DIAGNOSIS — Z20822 Contact with and (suspected) exposure to covid-19: Secondary | ICD-10-CM | POA: Diagnosis present

## 2020-03-10 DIAGNOSIS — E785 Hyperlipidemia, unspecified: Secondary | ICD-10-CM | POA: Diagnosis present

## 2020-03-10 DIAGNOSIS — G9341 Metabolic encephalopathy: Secondary | ICD-10-CM | POA: Diagnosis present

## 2020-03-10 DIAGNOSIS — I82811 Embolism and thrombosis of superficial veins of right lower extremities: Secondary | ICD-10-CM | POA: Diagnosis present

## 2020-03-10 DIAGNOSIS — Z66 Do not resuscitate: Secondary | ICD-10-CM | POA: Diagnosis present

## 2020-03-10 DIAGNOSIS — I10 Essential (primary) hypertension: Secondary | ICD-10-CM | POA: Diagnosis present

## 2020-03-10 DIAGNOSIS — E119 Type 2 diabetes mellitus without complications: Secondary | ICD-10-CM

## 2020-03-10 DIAGNOSIS — Z515 Encounter for palliative care: Secondary | ICD-10-CM

## 2020-03-10 DIAGNOSIS — Z7189 Other specified counseling: Secondary | ICD-10-CM

## 2020-03-10 DIAGNOSIS — E440 Moderate protein-calorie malnutrition: Secondary | ICD-10-CM | POA: Diagnosis present

## 2020-03-10 DIAGNOSIS — R296 Repeated falls: Secondary | ICD-10-CM | POA: Diagnosis present

## 2020-03-10 DIAGNOSIS — N390 Urinary tract infection, site not specified: Principal | ICD-10-CM | POA: Diagnosis present

## 2020-03-10 DIAGNOSIS — R627 Adult failure to thrive: Secondary | ICD-10-CM | POA: Diagnosis present

## 2020-03-10 DIAGNOSIS — I82431 Acute embolism and thrombosis of right popliteal vein: Secondary | ICD-10-CM | POA: Diagnosis present

## 2020-03-10 DIAGNOSIS — R5381 Other malaise: Secondary | ICD-10-CM | POA: Diagnosis present

## 2020-03-10 DIAGNOSIS — I5032 Chronic diastolic (congestive) heart failure: Secondary | ICD-10-CM | POA: Diagnosis present

## 2020-03-10 DIAGNOSIS — G9340 Encephalopathy, unspecified: Secondary | ICD-10-CM | POA: Diagnosis present

## 2020-03-10 DIAGNOSIS — F0390 Unspecified dementia without behavioral disturbance: Secondary | ICD-10-CM | POA: Diagnosis present

## 2020-03-10 DIAGNOSIS — E11649 Type 2 diabetes mellitus with hypoglycemia without coma: Secondary | ICD-10-CM | POA: Diagnosis not present

## 2020-03-10 DIAGNOSIS — Z6823 Body mass index (BMI) 23.0-23.9, adult: Secondary | ICD-10-CM

## 2020-03-10 LAB — CBC WITH DIFFERENTIAL/PLATELET
Abs Immature Granulocytes: 0.03 10*3/uL (ref 0.00–0.07)
Basophils Absolute: 0 10*3/uL (ref 0.0–0.1)
Basophils Relative: 0 %
Eosinophils Absolute: 0.2 10*3/uL (ref 0.0–0.5)
Eosinophils Relative: 3 %
HCT: 33.2 % — ABNORMAL LOW (ref 36.0–46.0)
Hemoglobin: 10.8 g/dL — ABNORMAL LOW (ref 12.0–15.0)
Immature Granulocytes: 0 %
Lymphocytes Relative: 17 %
Lymphs Abs: 1.2 10*3/uL (ref 0.7–4.0)
MCH: 30.3 pg (ref 26.0–34.0)
MCHC: 32.5 g/dL (ref 30.0–36.0)
MCV: 93 fL (ref 80.0–100.0)
Monocytes Absolute: 0.8 10*3/uL (ref 0.1–1.0)
Monocytes Relative: 12 %
Neutro Abs: 4.8 10*3/uL (ref 1.7–7.7)
Neutrophils Relative %: 68 %
Platelets: 242 10*3/uL (ref 150–400)
RBC: 3.57 MIL/uL — ABNORMAL LOW (ref 3.87–5.11)
RDW: 13.1 % (ref 11.5–15.5)
WBC: 7.1 10*3/uL (ref 4.0–10.5)
nRBC: 0 % (ref 0.0–0.2)

## 2020-03-10 LAB — URINALYSIS, ROUTINE W REFLEX MICROSCOPIC
Bilirubin Urine: NEGATIVE
Glucose, UA: NEGATIVE mg/dL
Ketones, ur: NEGATIVE mg/dL
Nitrite: POSITIVE — AB
Protein, ur: 30 mg/dL — AB
Specific Gravity, Urine: 1.033 — ABNORMAL HIGH (ref 1.005–1.030)
WBC, UA: >50 WBC/hpf — ABNORMAL HIGH (ref 0–5)
pH: 5 (ref 5.0–8.0)

## 2020-03-10 LAB — COMPREHENSIVE METABOLIC PANEL
ALT: 12 U/L (ref 0–44)
AST: 17 U/L (ref 15–41)
Albumin: 2.6 g/dL — ABNORMAL LOW (ref 3.5–5.0)
Alkaline Phosphatase: 51 U/L (ref 38–126)
Anion gap: 10 (ref 5–15)
BUN: 25 mg/dL — ABNORMAL HIGH (ref 8–23)
CO2: 22 mmol/L (ref 22–32)
Calcium: 9.1 mg/dL (ref 8.9–10.3)
Chloride: 108 mmol/L (ref 98–111)
Creatinine, Ser: 0.76 mg/dL (ref 0.44–1.00)
GFR, Estimated: >60 mL/min (ref >60)
Glucose, Bld: 135 mg/dL — ABNORMAL HIGH (ref 70–99)
Potassium: 3.8 mmol/L (ref 3.5–5.1)
Sodium: 140 mmol/L (ref 135–145)
Total Bilirubin: 0.5 mg/dL (ref 0.3–1.2)
Total Protein: 6 g/dL — ABNORMAL LOW (ref 6.5–8.1)

## 2020-03-10 LAB — TROPONIN I (HIGH SENSITIVITY): Troponin I (High Sensitivity): 8 ng/L (ref <18)

## 2020-03-10 LAB — RESPIRATORY PANEL BY RT PCR (FLU A&B, COVID)
Influenza A by PCR: NEGATIVE
Influenza B by PCR: NEGATIVE
SARS Coronavirus 2 by RT PCR: NEGATIVE

## 2020-03-10 LAB — LACTIC ACID, PLASMA: Lactic Acid, Venous: 1.4 mmol/L (ref 0.5–1.9)

## 2020-03-10 MED ORDER — APIXABAN 5 MG PO TABS
10.0000 mg | ORAL_TABLET | Freq: Two times a day (BID) | ORAL | Status: AC
  Administered 2020-03-10 – 2020-03-17 (×14): 10 mg via ORAL
  Filled 2020-03-10 (×14): qty 2

## 2020-03-10 MED ORDER — APIXABAN 5 MG PO TABS: 5.0000 mg | ORAL_TABLET | Freq: Two times a day (BID) | ORAL | Status: DC

## 2020-03-10 MED ORDER — LACTATED RINGERS IV BOLUS
1000.0000 mL | Freq: Once | INTRAVENOUS | Status: AC
  Administered 2020-03-10: 1000 mL via INTRAVENOUS

## 2020-03-10 MED ORDER — IOHEXOL 350 MG/ML SOLN
100.0000 mL | Freq: Once | INTRAVENOUS | Status: AC | PRN
  Administered 2020-03-10: 100 mL via INTRAVENOUS

## 2020-03-10 NOTE — ED Triage Notes (Signed)
Patient from home via EMS with C/O bed sores and possible UTI.  Her room mates are her age and cannot adequately take care of her. On exam a 0.4 cm sacral  Stage 2 decubitus ulcer was noted.  Sacral foam dressing applied.

## 2020-03-10 NOTE — ED Notes (Signed)
Patient transported to CT 

## 2020-03-10 NOTE — Progress Notes (Signed)
Right lower extremity venous duplex has been completed. Preliminary results can be found in CV Proc through chart review.  Results were given to Dr. Anitra Lauth.  03/10/20 6:50 PM Olen Cordial RVT

## 2020-03-10 NOTE — ED Provider Notes (Signed)
[facility] EMERGENCY DEPARTMENT Provider Note   CSN: [id number] Arrival date & time: 03/10/20  1702     History Chief Complaint  Patient presents with  . Fever    Carolyn Bishop is a 86 y.o. female.  Patient is an 86 year old female with a history of hypertension, hyperlipidemia, diabetes, dementia, failure to thrive at home with worsening functional status who is presenting today with generalized weakness, increased somnolence and poor oral intake.  Most of history was provided by patient's niece Mellanie.  Has been home from the hospital since August and has had home health coming in to get her out of bed however she is no longer walking she has been mostly bedbound for over a month now and the caretaker came in today and reported she would no longer be coming back to the home because the patient was too high acuity for her to help at home.  Family notes that she has been declining over the last month and had been diagnosed with dementia.  They report that she normally babbles and hallucinates about things that are not there however since Friday she has had increased sleepiness, refusing to eat and has been complaining of pain in her legs.  They noticed that she had swelling and warmth in her right leg which is new since Friday.  She has had no vomiting or diarrhea.  She has had no cough or respiratory symptoms.  Patient denies any abdominal pain or chest pain here.  They felt that she was getting sacral wound because a again are having difficulty taking care of her.  Her sisters live with her but they are also elderly and cannot turn her or lift her.  Her niece works and cannot be present regularly.  She has started medications for dementia and her blood pressure but she has been on those for approximately 1 month.  She has had no recent changes in medication in the last 1 week.  There is been no cough or congestion.  The history is provided by the patient and a caregiver.    Fever      Past Medical History:  Diagnosis Date  . Abnormal gait    Onset 02/05/2015  . Arthritis   . Benign essential hypertension   . Diabetes (HCC)   . Diabetes mellitus type II, controlled, with no complications (HCC)   . HLD (hyperlipidemia)   . HTN (hypertension)   . Obesity   . Palpitations   . Vitamin D deficiency     Patient Active Problem List   Diagnosis Date Noted  . Pressure injury of skin 03/10/2020  . Malnutrition of moderate degree 12/22/2019  . Fall at home 12/20/2019  . Acute encephalopathy 10/28/2019  . Grade I diastolic dysfunction 10/28/2019  . HLD (hyperlipidemia) 10/28/2017  . Acute urinary retention   . AKI (acute kidney injury) (HCC) 10/27/2017  . Hypotension 10/27/2017  . Prediabetes 10/27/2017  . Essential hypertension 10/27/2017  . Hypoglycemia 10/27/2017  . Normocytic anemia 10/27/2017  . GIB (gastrointestinal bleeding) 10/27/2017  . Lung mass 10/27/2017  . Lactic acid acidosis 10/27/2017  . Left arm weakness 10/27/2017    Past Surgical History:  Procedure Laterality Date  . ABDOMINAL HYSTERECTOMY    . BIOPSY  10/29/2017   Procedure: BIOPSY;  Surgeon: Kerin Salen, MD;  Location: [facility];  Service: Gastroenterology;;  . ESOPHAGOGASTRODUODENOSCOPY (EGD) WITH PROPOFOL N/A 10/29/2017   Procedure: ESOPHAGOGASTRODUODENOSCOPY (EGD) WITH PROPOFOL;  Surgeon: Kerin Salen, MD;  Location: [facility];  Service: Gastroenterology;  Laterality: N/A;     OB History   No obstetric history on file.     Family History  Problem Relation Age of Onset  . Hypertension Mother 66  . Lung cancer Father 52       [employer]   . Heart Problems Sister   . Kidney failure Sister   . Diabetes Sister   . Congestive Heart Failure Sister        CHF    Social History   Tobacco Use  . Smoking status: Never Smoker  . Smokeless tobacco: Never Used  Substance Use Topics  . Alcohol use: No    Alcohol/week: 0.0 standard drinks  .  Drug use: No    Home Medications Prior to Admission medications   Medication Sig Start Date End Date Taking? Authorizing Provider  amLODipine (NORVASC) 5 MG tablet Take 1 tablet (5 mg total) by mouth daily. 02/05/20 02/04/21  Sharon Seller, NP    Allergies    Patient has no known allergies.  Review of Systems   Review of Systems  Constitutional: Positive for fever.  All other systems reviewed and are negative.   Physical Exam Updated Vital Signs BP (!) 151/98   Pulse 84   Temp 99.8 F (37.7 C) (Rectal)   Resp 20   Ht 5\' 3"  (1.6 m)   Wt 59 kg   SpO2 98%   BMI 23.03 kg/m   Physical Exam Vitals and nursing note reviewed.  Constitutional:      General: She is not in acute distress.    Appearance: Normal appearance. She is well-developed and normal weight.     Comments: Patient is awake and alert.  She is in no acute distress.  HENT:     Head: Normocephalic and atraumatic.  Eyes:     Pupils: Pupils are equal, round, and reactive to light.  Cardiovascular:     Rate and Rhythm: Normal rate and regular rhythm.     Heart sounds: Normal heart sounds. No murmur heard.  No friction rub.  Pulmonary:     Effort: Pulmonary effort is normal.     Breath sounds: Normal breath sounds. No wheezing or rales.  Abdominal:     General: Bowel sounds are normal. There is no distension.     Palpations: Abdomen is soft.     Tenderness: There is no abdominal tenderness. There is no guarding or rebound.  Musculoskeletal:        General: Tenderness present. Normal range of motion.     Right lower leg: Edema present.     Comments: Tenderness with palpation to the right lower extremity.  Nonpitting edema noted to the right lower extremity compared to the left  Skin:    General: Skin is warm and dry.     Capillary Refill: Capillary refill takes less than 2 seconds.     Findings: No rash.  Neurological:     Mental Status: She is alert.     Cranial Nerves: No cranial nerve deficit.      Comments: Oriented to person and place.  Not oriented to time.  Mild contractures noted in bilateral lower extremities.  When attempting to move the leg she has pain.  Psychiatric:     Comments: Calm and cooperative     ED Results / Procedures / Treatments   Labs (all labs ordered are listed, but only abnormal results are displayed) Labs Reviewed  URINE CULTURE  CULTURE, BLOOD (ROUTINE X 2)  CULTURE, BLOOD (ROUTINE X 2)  RESPIRATORY PANEL BY RT PCR (FLU A&B, COVID)  CBC WITH DIFFERENTIAL/PLATELET  COMPREHENSIVE METABOLIC PANEL  LACTIC ACID, PLASMA  URINALYSIS, ROUTINE W REFLEX MICROSCOPIC  D-DIMER, QUANTITATIVE (NOT AT [facility])  TROPONIN I (HIGH SENSITIVITY)    EKG EKG Interpretation  Date/Time:  Monday March 10 2020 17:06:32 EDT Ventricular Rate:  77 PR Interval:    QRS Duration: 98 QT Interval:  373 QTC Calculation: 423 R Axis:   38 Text Interpretation: Sinus or ectopic atrial rhythm Prolonged PR interval Borderline low voltage, extremity leads No significant change since last tracing Confirmed by Gwyneth Sproutlunkett, Zong Mcquarrie ([id number]) on 03/10/2020 5:26:35 PM   Radiology DG Chest Port 1 View  Result Date: 03/10/2020 CLINICAL DATA:  Weakness, fever.  Concern for sepsis. EXAM: PORTABLE CHEST 1 VIEW.  Patient is rotated. COMPARISON:  Chest x-ray 12/20/2019 FINDINGS: The heart size and mediastinal contours are within normal limits. Lucent right lung compared to the left likely due to patient rotation. En facet vessel identified within the lower/mid lung zone. No focal consolidation. No pulmonary edema. No pleural effusion. No pneumothorax. No acute osseous abnormality. IMPRESSION: No acute cardiopulmonary abnormality. Electronically Signed   By: Tish FredericksonMorgane  Naveau M.D.   On: 03/10/2020 18:43   VAS US LOWER EXTREMITY VENOUS (DVT) (ONLY [facility] & [facility] 7a-7p)  Result Date: 03/10/2020  Lower Venous DVT Study Indications: Edema.  Risk Factors: None identified. Limitations: Poor ultrasound/tissue  interface and patient positioning, patient immobility, patient pain tolerance. Comparison Study: No prior studies. Performing Technologist: Chanda BusingGregory Collins RVT  Examination Guidelines: A complete evaluation includes B-mode imaging, spectral Doppler, color Doppler, and power Doppler as needed of all accessible portions of each vessel. Bilateral testing is considered an integral part of a complete examination. Limited examinations for reoccurring indications may be performed as noted. The reflux portion of the exam is performed with the patient in reverse Trendelenburg.  +---------+---------------+---------+-----------+----------+-----------------+ RIGHT    CompressibilityPhasicitySpontaneityPropertiesThrombus Aging    +---------+---------------+---------+-----------+----------+-----------------+ CFV      Partial        No       No                   Age Indeterminate +---------+---------------+---------+-----------+----------+-----------------+ SFJ      Full                                                           +---------+---------------+---------+-----------+----------+-----------------+ FV Prox  Partial        No       No                   Age Indeterminate +---------+---------------+---------+-----------+----------+-----------------+ FV Mid   Partial        No       No                   Age Indeterminate +---------+---------------+---------+-----------+----------+-----------------+ FV DistalPartial        No       No                   Age Indeterminate +---------+---------------+---------+-----------+----------+-----------------+ PFV  Not visualized    +---------+---------------+---------+-----------+----------+-----------------+ POP      None           No       No                   Age Indeterminate +---------+---------------+---------+-----------+----------+-----------------+ PTV      Full                                                            +---------+---------------+---------+-----------+----------+-----------------+ PERO     Full                                                           +---------+---------------+---------+-----------+----------+-----------------+ Gastroc  Full                                                           +---------+---------------+---------+-----------+----------+-----------------+ SSV      None                                         Age Indeterminate +---------+---------------+---------+-----------+----------+-----------------+ EIV                     No       No                                     +---------+---------------+---------+-----------+----------+-----------------+ CIV                                                   Not visualized    +---------+---------------+---------+-----------+----------+-----------------+   +----+---------------+---------+-----------+----------+--------------+ LEFTCompressibilityPhasicitySpontaneityPropertiesThrombus Aging +----+---------------+---------+-----------+----------+--------------+ CFV Full           Yes      Yes                                 +----+---------------+---------+-----------+----------+--------------+     Summary: RIGHT: - Findings consistent with acute deep vein thrombosis involving the right common femoral vein, right femoral vein, and right popliteal vein. - Findings consistent with acute superficial vein thrombosis involving the right small saphenous vein. - No cystic structure found in the popliteal fossa. - The external iliac vein appears patent. Unable to insonate common iliac vein due to overlying bowel gas, and patient positioning.  LEFT: - No evidence of common femoral vein obstruction.  *See table(s) above for measurements and observations.    Preliminary     Procedures Procedures (including critical care time)  Medications Ordered in  ED Medications  lactated ringers bolus 1,000 mL (has no administration in time range)  ED Course  I have reviewed the triage vital signs and the nursing notes.  Pertinent labs & imaging results that were available during my care of the patient were reviewed by me and considered in my medical decision making (see chart for details).    MDM Rules/Calculators/A&P                          Elderly female presenting today from home with failure to thrive.  Family notes for the last 3 days she has refused to eat or drink much.  She has been sleeping constantly and not acting herself.  Also noted right leg pain and swelling.  No notable decubitus wounds present over the sacrum on my investigation.  Patient does have pain in the right lower extremity with some edema and ultrasound shows acute DVT of the right common femoral, femoral and popliteal vein.  He also has superficial venous thrombosis of the right small saphenous vein as well.  The external and common iliac appear patent.  Patient is not tachycardic or complaining of chest pain at this time.  However also discern for possible AKI with poor oral intake, underlying infection.  Labs are pending.  Chest x-ray without acute findings.  Covid pending.  Final Clinical Impression(s) / ED Diagnoses Final diagnoses:  None    Rx / DC Orders ED Discharge Orders    None       Gwyneth Sprout, MD 03/10/20 2315

## 2020-03-11 ENCOUNTER — Inpatient Hospital Stay (HOSPITAL_COMMUNITY): Payer: Medicare HMO

## 2020-03-11 DIAGNOSIS — Z66 Do not resuscitate: Secondary | ICD-10-CM | POA: Diagnosis present

## 2020-03-11 DIAGNOSIS — R296 Repeated falls: Secondary | ICD-10-CM | POA: Diagnosis present

## 2020-03-11 DIAGNOSIS — E785 Hyperlipidemia, unspecified: Secondary | ICD-10-CM | POA: Diagnosis present

## 2020-03-11 DIAGNOSIS — G9341 Metabolic encephalopathy: Secondary | ICD-10-CM | POA: Diagnosis present

## 2020-03-11 DIAGNOSIS — L89159 Pressure ulcer of sacral region, unspecified stage: Secondary | ICD-10-CM

## 2020-03-11 DIAGNOSIS — G9340 Encephalopathy, unspecified: Secondary | ICD-10-CM

## 2020-03-11 DIAGNOSIS — R627 Adult failure to thrive: Secondary | ICD-10-CM | POA: Diagnosis present

## 2020-03-11 DIAGNOSIS — E119 Type 2 diabetes mellitus without complications: Secondary | ICD-10-CM

## 2020-03-11 DIAGNOSIS — D649 Anemia, unspecified: Secondary | ICD-10-CM

## 2020-03-11 DIAGNOSIS — I82491 Acute embolism and thrombosis of other specified deep vein of right lower extremity: Secondary | ICD-10-CM

## 2020-03-11 DIAGNOSIS — I82431 Acute embolism and thrombosis of right popliteal vein: Secondary | ICD-10-CM | POA: Diagnosis present

## 2020-03-11 DIAGNOSIS — B9620 Unspecified Escherichia coli [E. coli] as the cause of diseases classified elsewhere: Secondary | ICD-10-CM | POA: Diagnosis present

## 2020-03-11 DIAGNOSIS — Z79899 Other long term (current) drug therapy: Secondary | ICD-10-CM | POA: Diagnosis not present

## 2020-03-11 DIAGNOSIS — E11649 Type 2 diabetes mellitus with hypoglycemia without coma: Secondary | ICD-10-CM | POA: Diagnosis not present

## 2020-03-11 DIAGNOSIS — I82411 Acute embolism and thrombosis of right femoral vein: Secondary | ICD-10-CM | POA: Diagnosis present

## 2020-03-11 DIAGNOSIS — N3001 Acute cystitis with hematuria: Secondary | ICD-10-CM

## 2020-03-11 DIAGNOSIS — R5381 Other malaise: Secondary | ICD-10-CM | POA: Diagnosis present

## 2020-03-11 DIAGNOSIS — I10 Essential (primary) hypertension: Secondary | ICD-10-CM

## 2020-03-11 DIAGNOSIS — F0390 Unspecified dementia without behavioral disturbance: Secondary | ICD-10-CM | POA: Diagnosis present

## 2020-03-11 DIAGNOSIS — Z7189 Other specified counseling: Secondary | ICD-10-CM

## 2020-03-11 DIAGNOSIS — R319 Hematuria, unspecified: Secondary | ICD-10-CM | POA: Diagnosis not present

## 2020-03-11 DIAGNOSIS — I82409 Acute embolism and thrombosis of unspecified deep veins of unspecified lower extremity: Secondary | ICD-10-CM | POA: Diagnosis present

## 2020-03-11 DIAGNOSIS — I5032 Chronic diastolic (congestive) heart failure: Secondary | ICD-10-CM | POA: Diagnosis present

## 2020-03-11 DIAGNOSIS — N390 Urinary tract infection, site not specified: Secondary | ICD-10-CM | POA: Diagnosis present

## 2020-03-11 DIAGNOSIS — E440 Moderate protein-calorie malnutrition: Secondary | ICD-10-CM

## 2020-03-11 DIAGNOSIS — L89152 Pressure ulcer of sacral region, stage 2: Secondary | ICD-10-CM | POA: Diagnosis present

## 2020-03-11 DIAGNOSIS — E538 Deficiency of other specified B group vitamins: Secondary | ICD-10-CM | POA: Diagnosis present

## 2020-03-11 DIAGNOSIS — Z20822 Contact with and (suspected) exposure to covid-19: Secondary | ICD-10-CM | POA: Diagnosis present

## 2020-03-11 DIAGNOSIS — Z6823 Body mass index (BMI) 23.0-23.9, adult: Secondary | ICD-10-CM | POA: Diagnosis not present

## 2020-03-11 DIAGNOSIS — I82811 Embolism and thrombosis of superficial veins of right lower extremities: Secondary | ICD-10-CM | POA: Diagnosis present

## 2020-03-11 DIAGNOSIS — Z515 Encounter for palliative care: Secondary | ICD-10-CM

## 2020-03-11 DIAGNOSIS — I110 Hypertensive heart disease with heart failure: Secondary | ICD-10-CM | POA: Diagnosis present

## 2020-03-11 LAB — HEMOGLOBIN A1C
Hgb A1c MFr Bld: 6.6 % — ABNORMAL HIGH (ref 4.8–5.6)
Mean Plasma Glucose: 142.72 mg/dL

## 2020-03-11 LAB — CBC WITH DIFFERENTIAL/PLATELET
Abs Immature Granulocytes: 0.02 10*3/uL (ref 0.00–0.07)
Basophils Absolute: 0 10*3/uL (ref 0.0–0.1)
Basophils Relative: 1 %
Eosinophils Absolute: 0.2 10*3/uL (ref 0.0–0.5)
Eosinophils Relative: 4 %
HCT: 32.7 % — ABNORMAL LOW (ref 36.0–46.0)
Hemoglobin: 10.4 g/dL — ABNORMAL LOW (ref 12.0–15.0)
Immature Granulocytes: 0 %
Lymphocytes Relative: 20 %
Lymphs Abs: 1.2 10*3/uL (ref 0.7–4.0)
MCH: 30 pg (ref 26.0–34.0)
MCHC: 31.8 g/dL (ref 30.0–36.0)
MCV: 94.2 fL (ref 80.0–100.0)
Monocytes Absolute: 0.7 10*3/uL (ref 0.1–1.0)
Monocytes Relative: 12 %
Neutro Abs: 3.9 10*3/uL (ref 1.7–7.7)
Neutrophils Relative %: 63 %
Platelets: 236 10*3/uL (ref 150–400)
RBC: 3.47 MIL/uL — ABNORMAL LOW (ref 3.87–5.11)
RDW: 13.2 % (ref 11.5–15.5)
WBC: 6.1 10*3/uL (ref 4.0–10.5)
nRBC: 0 % (ref 0.0–0.2)

## 2020-03-11 LAB — IRON AND TIBC
Iron: 56 ug/dL (ref 28–170)
Saturation Ratios: 26 % (ref 10.4–31.8)
TIBC: 216 ug/dL — ABNORMAL LOW (ref 250–450)
UIBC: 160 ug/dL

## 2020-03-11 LAB — COMPREHENSIVE METABOLIC PANEL
ALT: 10 U/L (ref 0–44)
AST: 16 U/L (ref 15–41)
Albumin: 2.3 g/dL — ABNORMAL LOW (ref 3.5–5.0)
Alkaline Phosphatase: 47 U/L (ref 38–126)
Anion gap: 9 (ref 5–15)
BUN: 20 mg/dL (ref 8–23)
CO2: 25 mmol/L (ref 22–32)
Calcium: 8.7 mg/dL — ABNORMAL LOW (ref 8.9–10.3)
Chloride: 105 mmol/L (ref 98–111)
Creatinine, Ser: 0.68 mg/dL (ref 0.44–1.00)
GFR, Estimated: >60 mL/min (ref >60)
Glucose, Bld: 217 mg/dL — ABNORMAL HIGH (ref 70–99)
Potassium: 3.6 mmol/L (ref 3.5–5.1)
Sodium: 139 mmol/L (ref 135–145)
Total Bilirubin: 0.3 mg/dL (ref 0.3–1.2)
Total Protein: 5.5 g/dL — ABNORMAL LOW (ref 6.5–8.1)

## 2020-03-11 LAB — RETICULOCYTES
Immature Retic Fract: 5.9 % (ref 2.3–15.9)
RBC.: 3.36 MIL/uL — ABNORMAL LOW (ref 3.87–5.11)
Retic Count, Absolute: 39.3 10*3/uL (ref 19.0–186.0)
Retic Ct Pct: 1.2 % (ref 0.4–3.1)

## 2020-03-11 LAB — TROPONIN I (HIGH SENSITIVITY): Troponin I (High Sensitivity): 10 ng/L (ref <18)

## 2020-03-11 LAB — PREALBUMIN: Prealbumin: 13.2 mg/dL — ABNORMAL LOW (ref 18–38)

## 2020-03-11 LAB — CBG MONITORING, ED
Glucose-Capillary: 106 mg/dL — ABNORMAL HIGH (ref 70–99)
Glucose-Capillary: 203 mg/dL — ABNORMAL HIGH (ref 70–99)
Glucose-Capillary: 150 mg/dL — ABNORMAL HIGH (ref 70–99)
Glucose-Capillary: 182 mg/dL — ABNORMAL HIGH (ref 70–99)
Glucose-Capillary: 125 mg/dL — ABNORMAL HIGH (ref 70–99)
Glucose-Capillary: 102 mg/dL — ABNORMAL HIGH (ref 70–99)

## 2020-03-11 LAB — CK: Total CK: 60 U/L (ref 38–234)

## 2020-03-11 LAB — GLUCOSE, CAPILLARY: Glucose-Capillary: 161 mg/dL — ABNORMAL HIGH (ref 70–99)

## 2020-03-11 LAB — MAGNESIUM: Magnesium: 1.6 mg/dL — ABNORMAL LOW (ref 1.7–2.4)

## 2020-03-11 LAB — FERRITIN: Ferritin: 100 ng/mL (ref 11–307)

## 2020-03-11 LAB — FOLATE: Folate: 8.7 ng/mL (ref >5.9)

## 2020-03-11 LAB — PHOSPHORUS: Phosphorus: 3.1 mg/dL (ref 2.5–4.6)

## 2020-03-11 LAB — VITAMIN B12: Vitamin B-12: 210 pg/mL (ref 180–914)

## 2020-03-11 MED ORDER — ACETAMINOPHEN 325 MG PO TABS
650.0000 mg | ORAL_TABLET | Freq: Four times a day (QID) | ORAL | Status: DC | PRN
  Administered 2020-03-11 – 2020-03-13 (×4): 650 mg via ORAL
  Filled 2020-03-11 (×4): qty 2

## 2020-03-11 MED ORDER — MEMANTINE HCL 10 MG PO TABS
10.0000 mg | ORAL_TABLET | Freq: Two times a day (BID) | ORAL | Status: DC
  Administered 2020-03-11 – 2020-03-17 (×13): 10 mg via ORAL
  Filled 2020-03-11 (×13): qty 1

## 2020-03-11 MED ORDER — SODIUM CHLORIDE 0.9 % IV SOLN
1.0000 g | Freq: Once | INTRAVENOUS | Status: AC
  Administered 2020-03-11: 1 g via INTRAVENOUS
  Filled 2020-03-11: qty 10

## 2020-03-11 MED ORDER — CYANOCOBALAMIN 1000 MCG/ML IJ SOLN
1000.0000 ug | Freq: Every day | INTRAMUSCULAR | Status: AC
  Administered 2020-03-11 – 2020-03-17 (×7): 1000 ug via SUBCUTANEOUS
  Filled 2020-03-11 (×7): qty 1

## 2020-03-11 MED ORDER — SODIUM CHLORIDE 0.9 % IV SOLN
75.0000 mL/h | INTRAVENOUS | Status: DC
  Administered 2020-03-11: 75 mL/h via INTRAVENOUS

## 2020-03-11 MED ORDER — AMLODIPINE BESYLATE 5 MG PO TABS
5.0000 mg | ORAL_TABLET | Freq: Every day | ORAL | Status: DC
  Administered 2020-03-11 – 2020-03-12 (×2): 5 mg via ORAL
  Filled 2020-03-11 (×2): qty 1

## 2020-03-11 MED ORDER — THIAMINE HCL 100 MG PO TABS
100.0000 mg | ORAL_TABLET | Freq: Every day | ORAL | Status: DC
  Administered 2020-03-11 – 2020-03-17 (×7): 100 mg via ORAL
  Filled 2020-03-11 (×7): qty 1

## 2020-03-11 MED ORDER — SODIUM CHLORIDE 0.9 % IV SOLN
1.0000 g | INTRAVENOUS | Status: DC
  Administered 2020-03-12 – 2020-03-13 (×2): 1 g via INTRAVENOUS
  Filled 2020-03-11 (×2): qty 10

## 2020-03-11 MED ORDER — HYDROCODONE-ACETAMINOPHEN 5-325 MG PO TABS: 1.0000 | ORAL_TABLET | ORAL | Status: DC | PRN

## 2020-03-11 MED ORDER — ONDANSETRON HCL 4 MG/2ML IJ SOLN: 4.0000 mg | Freq: Four times a day (QID) | INTRAMUSCULAR | Status: DC | PRN

## 2020-03-11 MED ORDER — INSULIN ASPART 100 UNIT/ML ~~LOC~~ SOLN
0.0000 [IU] | SUBCUTANEOUS | Status: DC
  Administered 2020-03-11: 2 [IU] via SUBCUTANEOUS
  Administered 2020-03-11: 1 [IU] via SUBCUTANEOUS
  Administered 2020-03-11: 3 [IU] via SUBCUTANEOUS
  Administered 2020-03-11 – 2020-03-12 (×2): 1 [IU] via SUBCUTANEOUS
  Administered 2020-03-12 (×2): 2 [IU] via SUBCUTANEOUS
  Administered 2020-03-12: 1 [IU] via SUBCUTANEOUS
  Administered 2020-03-13: 3 [IU] via SUBCUTANEOUS

## 2020-03-11 MED ORDER — DOCUSATE SODIUM 100 MG PO CAPS
100.0000 mg | ORAL_CAPSULE | Freq: Two times a day (BID) | ORAL | Status: DC
  Administered 2020-03-11 – 2020-03-17 (×13): 100 mg via ORAL
  Filled 2020-03-11 (×13): qty 1

## 2020-03-11 MED ORDER — ACETAMINOPHEN 650 MG RE SUPP: 650.0000 mg | Freq: Four times a day (QID) | RECTAL | Status: DC | PRN

## 2020-03-11 MED ORDER — ONDANSETRON HCL 4 MG PO TABS: 4.0000 mg | ORAL_TABLET | Freq: Four times a day (QID) | ORAL | Status: DC | PRN

## 2020-03-11 MED ORDER — ACETAMINOPHEN 325 MG PO TABS: 650.0000 mg | ORAL_TABLET | Freq: Four times a day (QID) | ORAL | Status: DC | PRN

## 2020-03-11 NOTE — Consult Note (Signed)
WOC Nurse Consult Note: Patient receiving care in [facility] ED26 Reason for Consult: Sacral wound Wound type: MASD small round area at the coccyx. The area is intact blanchable. Measurement: 0.8 cm  x 0.6 cm Wound bed: pink Drainage (amount, consistency, odor) None Periwound:Intact Dressing procedure/placement/frequency: Continue foam dressing to the coccyx Peel down all sites with a foam dressing EACH shift.  Record your observations.  Change foam dressing every 3 days or PRN soiling.  Notify the physician team if the area worsens  Monitor the wound area(s) for worsening of condition such as: Signs/symptoms of infection,  Increase in size,  Development of or worsening of odor, Development of pain, or increased pain at the affected locations.   Notify the medical team if any of these develop.  Thank you for the consult.  Discussed plan of care with the patient and bedside nurse.  WOC nurse will not follow at this time.   Please re-consult the WOC team if needed.  Renaldo Reel Katrinka Blazing, MSN, RN, CMSRN, Angus Seller, [facility] Wound Treatment Associate Pager [phone]

## 2020-03-11 NOTE — ED Notes (Signed)
Lunch Tray Ordered @ 1009. 

## 2020-03-11 NOTE — H&P (Signed)
Carolyn Bishop ZOX:[id number] DOB: [birth year] DOA: 03/10/2020     PCP: Sharon Seller, NP   Outpatient Specialists:   NONE    Patient arrived to ER on 03/10/20 at 1702 Referred by Attending Gwyneth Sprout, MD   Patient coming from: home Lives  With family    Chief Complaint:  Chief Complaint  Patient presents with  . Fever    HPI: Carolyn Bishop is a 86 y.o. female with medical history significant of arthritis, HTN, frequent falls, diabetes, HLD, vitamin D deficiency, dementia, failure to thrive    Presented with bedsores and possible UTI Patient been more altered for the past few days.  She has been reporting some pain in her legs. Since Friday she has had pain and swelling in her right leg No nausea no vomiting no chest pain abdominal pain Pt lives with her sisters and have personal caregiver She walks with rolling walker Continues to have frequent falls Family noted patient had fever Last admission was in August 2021 for UTI and falls PT recommended skilled nursing facility and patient was discharged to SNF but then was able to go home after that After being discharged from SNF to home she seemed to be mostly bedbound Her caretakers were no longer able to take care of her and today they said that they are no longer going to return secondary to patient being very high acuity  Lately patient has been refusing to eat and drink for the past 3 days. She keeps falling out of the bed No head trauma  Infectious risk factors:  Reports  fever,    Has been vaccinated against COVID ( family thinks at [facility]) got 1 shot   Initial COVID TEST  NEGATIVE   Lab Results  Component Value Date   SARSCOV2NAA NEGATIVE 03/10/2020   SARSCOV2NAA NEGATIVE 12/24/2019   SARSCOV2NAA NEGATIVE 12/20/2019   SARSCOV2NAA NEGATIVE 11/01/2019    Regarding pertinent Chronic problems:     Hyperlipidemia -  Not on statins    HTN on Norvasc    DM 2 -  Lab Results  Component Value Date    HGBA1C 6.5 (H) 02/04/2020   diet controlled     Chronic diastolic CHF echogram in August 2021 showed grade 1 diastolic dysfunction     Dementia - unsure what meds she is on   Chronic anemia - baseline hg Hemoglobin & Hematocrit  Recent Labs    12/25/19 0813 02/04/20 1427 03/10/20 2009  HGB 11.1* 13.0 10.8*     While in ER: Blood cultures obtained Ultrasound in the emergency department showed acute right common femoral vein DVT and superficial venous thrombosis Started on Eliquis Chest x-ray unremarkable Covid negative CTA negative for PE troponin unremarkable UA showed evidence of UTI she started Rocephin    Hospitalist was called for admission for UTI, Acute encephalopathy, and DVT  The following Work up has been ordered so far:  Orders Placed This Encounter  Procedures  . Urine Culture  . Culture, blood (Routine X 2) w Reflex to ID Panel  . Respiratory Panel by RT PCR (Flu A&B, Covid) - Nasopharyngeal Swab  . DG Chest Port 1 View  . CT Angio Chest PE W and/or Wo Contrast  . CBC with Differential/Platelet  . Comprehensive metabolic panel  . Lactic acid, plasma  . Urinalysis, Routine w reflex microscopic  . In and Out Cath  . In and Out Cath  . Consult to Transition of Care Team (SW and CM)  .  Consult to hospitalist  ALL PATIENTS BEING ADMITTED/HAVING PROCEDURES NEED COVID-19 SCREENING  . EKG 12-Lead  . VAS [place] LOWER EXTREMITY VENOUS (DVT) (ONLY [facility] & [facility] 7a-7p)    Following Medications were ordered in ER: Medications  apixaban (ELIQUIS) tablet 10 mg (10 mg Oral Given 03/10/20 2232)    Followed by  apixaban (ELIQUIS) tablet 5 mg (has no administration in time range)  cefTRIAXone (ROCEPHIN) 1 g in sodium chloride 0.9 % 100 mL IVPB (has no administration in time range)  lactated ringers bolus 1,000 mL (0 mLs Intravenous Stopped 03/10/20 2000)  iohexol (OMNIPAQUE) 350 MG/ML injection 100 mL (100 mLs Intravenous Contrast Given 03/10/20 2203)        Consult Orders   (From admission, onward)         Start     Ordered   03/11/20 0026  Consult to hospitalist  ALL PATIENTS BEING ADMITTED/HAVING PROCEDURES NEED COVID-19 SCREENING  Once       Comments: ALL PATIENTS BEING ADMITTED/HAVING PROCEDURES NEED COVID-19 SCREENING  Provider:  (Not yet assigned)  Question Answer Comment  Place call to: [facility]   Reason for Consult Admit      03/11/20 0025         Significant initial  Findings: Abnormal Labs Reviewed  CBC WITH DIFFERENTIAL/PLATELET - Abnormal; Notable for the following components:      Result Value   RBC 3.57 (*)    Hemoglobin 10.8 (*)    HCT 33.2 (*)    All other components within normal limits  COMPREHENSIVE METABOLIC PANEL - Abnormal; Notable for the following components:   Glucose, Bld 135 (*)    BUN 25 (*)    Total Protein 6.0 (*)    Albumin 2.6 (*)    All other components within normal limits  URINALYSIS, ROUTINE W REFLEX MICROSCOPIC - Abnormal; Notable for the following components:   Color, Urine AMBER (*)    APPearance TURBID (*)    Specific Gravity, Urine 1.033 (*)    Hgb urine dipstick SMALL (*)    Protein, ur 30 (*)    Nitrite POSITIVE (*)    Leukocytes,Ua LARGE (*)    WBC, UA >50 (*)    Bacteria, UA MANY (*)    Non Squamous Epithelial 0-5 (*)    All other components within normal limits    Otherwise labs showing:    Recent Labs  Lab 03/10/20 2009  NA 140  K 3.8  CO2 22  GLUCOSE 135*  BUN 25*  CREATININE 0.76  CALCIUM 9.1    Cr   stable,    Lab Results  Component Value Date   CREATININE 0.76 03/10/2020   CREATININE 0.87 02/04/2020   CREATININE 0.69 12/25/2019    Recent Labs  Lab 03/10/20 2009  AST 17  ALT 12  ALKPHOS 51  BILITOT 0.5  PROT 6.0*  ALBUMIN 2.6*   Lab Results  Component Value Date   CALCIUM 9.1 03/10/2020   PHOS 3.5 10/29/2019     WBC       Component Value Date/Time   WBC 7.1 03/10/2020 2009   LYMPHSABS 1.2 03/10/2020 2009   MONOABS 0.8 03/10/2020 2009    EOSABS 0.2 03/10/2020 2009   BASOSABS 0.0 03/10/2020 2009   Plt: Lab Results  Component Value Date   PLT 242 03/10/2020   Lactic Acid, Venous    Component Value Date/Time   LATICACIDVEN 1.4 03/10/2020 2009      Lab Results  Component Value Date  SARSCOV2NAA NEGATIVE 03/10/2020   SARSCOV2NAA NEGATIVE 12/24/2019   SARSCOV2NAA NEGATIVE 12/20/2019   SARSCOV2NAA NEGATIVE 11/01/2019    HG/HCT    Down from baseline see below    Component Value Date/Time   HGB 10.8 (L) 03/10/2020 2009   HCT 33.2 (L) 03/10/2020 2009   MCV 93.0 03/10/2020 2009    No results for input(s): LIPASE, AMYLASE in the last 168 hours. No results for input(s): AMMONIA in the last 168 hours.    Troponin 8  Cardiac Panel (last 3 results) No results for input(s): CKTOTAL, CKMB, TROPONINI, RELINDX in the last 72 hours.     ECG: Ordered Personally reviewed by me showing: HR : 77 Rhythm: NSR,     no evidence of ischemic changes QTC 423   BNP (last 3 results) No results for input(s): BNP in the last 8760 hours.    DM  labs:  HbA1C: Recent Labs    10/30/19 1238 12/21/19 0030 02/04/20 1427  HGBA1C 6.3* 6.3* 6.5*       CBG (last 3)  No results for input(s): GLUCAP in the last 72 hours.    UA    evidence of UTI      Urine analysis:    Component Value Date/Time   COLORURINE AMBER (A) 03/10/2020 1821   APPEARANCEUR TURBID (A) 03/10/2020 1821   LABSPEC 1.033 (H) 03/10/2020 1821   PHURINE 5.0 03/10/2020 1821   GLUCOSEU NEGATIVE 03/10/2020 1821   HGBUR SMALL (A) 03/10/2020 1821   BILIRUBINUR NEGATIVE 03/10/2020 1821   KETONESUR NEGATIVE 03/10/2020 1821   PROTEINUR 30 (A) 03/10/2020 1821   UROBILINOGEN 0.2 02/21/2015 2355   NITRITE POSITIVE (A) 03/10/2020 1821   LEUKOCYTESUR LARGE (A) 03/10/2020 1821     CT HEAD   Ordered  CXR - NON acute CTA chest -  nonacute, no PE,   no evidence of infiltrate Marked left ventricular hypertrophy     ED Triage Vitals  Enc Vitals Group     BP  03/10/20 1810 (!) 151/98     Pulse Rate 03/10/20 1810 84     Resp 03/10/20 1810 20     Temp 03/10/20 1810 99.8 F (37.7 C)     Temp Source 03/10/20 1810 Rectal     SpO2 03/10/20 1810 98 %     Weight 03/10/20 1724 130 lb (59 kg)     Height 03/10/20 1724 5\' 3"  (1.6 m)     Head Circumference --      Peak Flow --      Pain Score 03/10/20 1723 0     Pain Loc --      Pain Edu? --      Excl. in GC? --   TMAX(24)@      Latest  Blood pressure (!) 160/79, pulse 78, temperature 99.8 F (37.7 C), temperature source Rectal, resp. rate 19, height 5\' 3"  (1.6 m), weight 59 kg, SpO2 99 %.    Review of Systems:    Pertinent positives include:   Fevers, chills, fatigue, skin breakdown, leg edema  confusion Constitutional:  No weight loss, night sweats,, weight loss  HEENT:  No headaches, Difficulty swallowing,Tooth/dental problems,Sore throat,  No sneezing, itching, ear ache, nasal congestion, post nasal drip,  Cardio-vascular:  No chest pain, Orthopnea, PND, anasarca, dizziness, palpitations.no Bilateral lower extremity swelling  GI:  No heartburn, indigestion, abdominal pain, nausea, vomiting, diarrhea, change in bowel habits, loss of appetite, melena, blood in stool, hematemesis Resp:  no shortness of breath at rest. No dyspnea  on exertion, No excess mucus, no productive cough, No non-productive cough, No coughing up of blood.No change in color of mucus.No wheezing. Skin:  no rash or lesions. No jaundice GU:  no dysuria, change in color of urine, no urgency or frequency. No straining to urinate.  No flank pain.  Musculoskeletal:  No joint pain or no joint swelling. No decreased range of motion. No back pain.  Psych:  No change in mood or affect. No depression or anxiety. No memory loss.  Neuro: no localizing neurological complaints, no tingling, no weakness, no double vision, no gait abnormality, no slurred speech, no  All systems reviewed and apart from HOPI all are negative  Past  Medical History:   Past Medical History:  Diagnosis Date  . Abnormal gait    Onset 02/05/2015  . Arthritis   . Benign essential hypertension   . Diabetes (HCC)   . Diabetes mellitus type II, controlled, with no complications (HCC)   . HLD (hyperlipidemia)   . HTN (hypertension)   . Obesity   . Palpitations   . Vitamin D deficiency      Past Surgical History:  Procedure Laterality Date  . ABDOMINAL HYSTERECTOMY    . BIOPSY  10/29/2017   Procedure: BIOPSY;  Surgeon: Kerin Salen, MD;  Location: [facility];  Service: Gastroenterology;;  . ESOPHAGOGASTRODUODENOSCOPY (EGD) WITH PROPOFOL N/A 10/29/2017   Procedure: ESOPHAGOGASTRODUODENOSCOPY (EGD) WITH PROPOFOL;  Surgeon: Kerin Salen, MD;  Location: [facility];  Service: Gastroenterology;  Laterality: N/A;    Social History:  Ambulatory   wheelchair bound/bed bound     reports that she has never smoked. She has never used smokeless tobacco. She reports that she does not drink alcohol and does not use drugs.  Family History:   Family History  Problem Relation Age of Onset  . Hypertension Mother 75  . Lung cancer Father 53       [employer]   . Heart Problems Sister   . Kidney failure Sister   . Diabetes Sister   . Congestive Heart Failure Sister        CHF    Allergies: No Known Allergies   Prior to Admission medications   Medication Sig Start Date End Date Taking? Authorizing Provider  amLODipine (NORVASC) 5 MG tablet Take 1 tablet (5 mg total) by mouth daily. 02/05/20 02/04/21  Sharon Seller, NP   Physical Exam: Vitals with BMI 03/10/2020 03/10/2020 03/10/2020  Height - - -  Weight - - -  BMI - - -  Systolic 160 152 161  Diastolic 79 78 98  Pulse 78 83 84    1. General:  in No Acute distress    Chronically ill-appearing 2. Psychological: Alert and    Oriented to self only  3. Head/ENT:  Dry Mucous Membranes                          Head Non traumatic, neck supple                            Poor Dentition 4. SKIN:  decreased Skin turgor,  Skin clean Dry some scaring and redness on the sacrum 5. Heart: Regular rate and rhythm no  Murmur, no Rub or gallop 6. Lungs:  , no wheezes or crackles   7. Abdomen: Soft,  non-tender, Non distended bowel sounds present 8. Lower extremities: no clubbing, cyanosis, right leg edema  and redness 9. Neurologically Grossly intact, moving all 4 extremities equally  10. MSK: Normal range of motion   All other LABS:     Recent Labs  Lab 03/10/20 2009  WBC 7.1  NEUTROABS 4.8  HGB 10.8*  HCT 33.2*  MCV 93.0  PLT 242     Recent Labs  Lab 03/10/20 2009  NA 140  K 3.8  CL 108  CO2 22  GLUCOSE 135*  BUN 25*  CREATININE 0.76  CALCIUM 9.1     Recent Labs  Lab 03/10/20 2009  AST 17  ALT 12  ALKPHOS 51  BILITOT 0.5  PROT 6.0*  ALBUMIN 2.6*   Cultures:    Component Value Date/Time   SDES  12/20/2019 2120    URINE, RANDOM Performed at [facility], [address] [place], [place] [address]    SPECREQUEST  12/20/2019 2120    NONE Performed at [facility], [address] [place], [place] [address]    CULT (A) 12/20/2019 2120    <10,000 COLONIES/mL INSIGNIFICANT GROWTH Performed at [facility], [address] [place], [place] [address]    REPTSTATUS 12/21/2019 FINAL 12/20/2019 2120     Radiological Exams on Admission: CT Angio Chest PE W and/or Wo Contrast  Result Date: 03/10/2020 CLINICAL DATA:  Pulmonary embolism EXAM: CT ANGIOGRAPHY CHEST WITH CONTRAST TECHNIQUE: Multidetector CT imaging of the chest was performed using the standard protocol during bolus administration of intravenous contrast. Multiplanar CT image reconstructions and MIPs were obtained to evaluate the vascular anatomy. CONTRAST:  OMNIPAQUE IOHEXOL 350 MG/ML SOLN COMPARISON:  None. FINDINGS: Cardiovascular: There is excellent opacification of the pulmonary arterial tree. There is no intraluminal  filling defect to suggest acute pulmonary embolism. The central pulmonary arteries are of normal caliber. Moderate coronary artery calcification. Global cardiac size is within normal limits. Marked left ventricular hypertrophy noted. No pericardial effusion. Moderate atherosclerotic calcification is seen within the descending thoracic aorta. No aortic aneurysm. Mediastinum/Nodes: No pathologic thoracic adenopathy. Thyroid unremarkable. Small hiatal hernia. Lungs/Pleura: Benign 13 mm left basilar pleural lipoma. Lungs are clear. No pneumothorax or pleural effusion. Central airways are widely patent. Upper Abdomen: Multiple hyperdense lesions are seen within the visualized upper pole of the left kidney, incompletely evaluated on this examination. Moderate stool noted within the upper abdomen. Musculoskeletal: No acute bone abnormality. Review of the MIP images confirms the above findings. IMPRESSION: No pulmonary embolism. Marked left ventricular hypertrophy. Moderate coronary artery calcification. Incompletely evaluated hyperdense lesions within the upper pole of the left kidney. If indicated, these could be better assessed with dedicated renal sonography. Aortic Atherosclerosis (ICD10-I70.0). Electronically Signed   By: Helyn Numbers MD   On: 03/10/2020 22:58   DG Chest Port 1 View  Result Date: 03/10/2020 CLINICAL DATA:  Weakness, fever.  Concern for sepsis. EXAM: PORTABLE CHEST 1 VIEW.  Patient is rotated. COMPARISON:  Chest x-ray 12/20/2019 FINDINGS: The heart size and mediastinal contours are within normal limits. Lucent right lung compared to the left likely due to patient rotation. En facet vessel identified within the lower/mid lung zone. No focal consolidation. No pulmonary edema. No pleural effusion. No pneumothorax. No acute osseous abnormality. IMPRESSION: No acute cardiopulmonary abnormality. Electronically Signed   By: Tish Frederickson M.D.   On: 03/10/2020 18:43   VAS [place] LOWER EXTREMITY VENOUS  (DVT) (ONLY [facility] & [facility] 7a-7p)  Result Date: 03/10/2020  Lower Venous DVT Study Indications: Edema.  Risk Factors: None identified. Limitations: Poor ultrasound/tissue  interface and patient positioning, patient immobility, patient pain tolerance. Comparison Study: No prior studies. Performing Technologist: Chanda BusingGregory Collins RVT  Examination Guidelines: A complete evaluation includes B-mode imaging, spectral Doppler, color Doppler, and power Doppler as needed of all accessible portions of each vessel. Bilateral testing is considered an integral part of a complete examination. Limited examinations for reoccurring indications may be performed as noted. The reflux portion of the exam is performed with the patient in reverse Trendelenburg.  +---------+---------------+---------+-----------+----------+-----------------+ RIGHT    CompressibilityPhasicitySpontaneityPropertiesThrombus Aging    +---------+---------------+---------+-----------+----------+-----------------+ CFV      Partial        No       No                   Age Indeterminate +---------+---------------+---------+-----------+----------+-----------------+ SFJ      Full                                                           +---------+---------------+---------+-----------+----------+-----------------+ FV Prox  Partial        No       No                   Age Indeterminate +---------+---------------+---------+-----------+----------+-----------------+ FV Mid   Partial        No       No                   Age Indeterminate +---------+---------------+---------+-----------+----------+-----------------+ FV DistalPartial        No       No                   Age Indeterminate +---------+---------------+---------+-----------+----------+-----------------+ PFV                                                   Not visualized    +---------+---------------+---------+-----------+----------+-----------------+ POP      None            No       No                   Age Indeterminate +---------+---------------+---------+-----------+----------+-----------------+ PTV      Full                                                           +---------+---------------+---------+-----------+----------+-----------------+ PERO     Full                                                           +---------+---------------+---------+-----------+----------+-----------------+ Gastroc  Full                                                           +---------+---------------+---------+-----------+----------+-----------------+  SSV      None                                         Age Indeterminate +---------+---------------+---------+-----------+----------+-----------------+ EIV                     No       No                                     +---------+---------------+---------+-----------+----------+-----------------+ CIV                                                   Not visualized    +---------+---------------+---------+-----------+----------+-----------------+   +----+---------------+---------+-----------+----------+--------------+ LEFTCompressibilityPhasicitySpontaneityPropertiesThrombus Aging +----+---------------+---------+-----------+----------+--------------+ CFV Full           Yes      Yes                                 +----+---------------+---------+-----------+----------+--------------+     Summary: RIGHT: - Findings consistent with acute deep vein thrombosis involving the right common femoral vein, right femoral vein, and right popliteal vein. - Findings consistent with acute superficial vein thrombosis involving the right small saphenous vein. - No cystic structure found in the popliteal fossa. - The external iliac vein appears patent. Unable to insonate common iliac vein due to overlying bowel gas, and patient positioning.  LEFT: - No evidence of common femoral vein obstruction.   *See table(s) above for measurements and observations. Electronically signed by Fabienne Bruns MD on 03/10/2020 at 7:12:36 PM.    Final     Chart has been reviewed    Assessment/Plan  86 y.o. female with medical history significant of arthritis, HTN, frequent falls, diabetes, HLD, vitamin D deficiency, dementia, failure to thrive  Admitted for DVT, UTI, dehydration,  Pressure ulcer  Present on Admission:  . UTI (urinary tract infection) -  - treat with Rocephin       await results of urine culture and adjust antibiotic coverage as needed  . Normocytic anemia - check anemia panel, hemoccult stool  . Malnutrition of moderate degree - will check prealbumin, order nutrition consult   . HLD (hyperlipidemia) - chronic stable not on statins  . Essential hypertension - stable continue home meds  . Acute encephalopathy -   - most likely multifactorial secondary to combination of infection mild dehydration secondary to decreased by mouth intake,    - Will rehydrate   - treat underlining infection    - neurological exam appears to be nonfocal but patient unable to cooperate fully    Dehydration - due to decreased PO intake, check CK  . Debility - PT/OT eval  . DVT (deep venous thrombosis) (HCC) - continue Eliquis  . Chronic diastolic CHF (congestive heart failure) (HCC) - - currently appears to be slightly on the dry side  carefuly follow fluid status and Cr   . Pressure injury of skin - very small area of break down but skin care would prevent further skin damage  dm2 -  - Order Sensitive  SSI  -  check TSH and HgA1C  Other plan as per orders.  DVT prophylaxis:     apixaban (ELIQUIS) tablet 10 mg  apixaban (ELIQUIS) tablet 5 mg    Code Status:    Code Status: Prior   DNR/DNI as per  family  I had personally discussed CODE STATUS with  Family     Family Communication:   Family not at  Bedside  plan of care was discussed on the phone with niece  Disposition Plan: likely  will need placement for rehabilitation                           Following barriers for discharge:                                                           Anemia stable                                                            able to transition to PO antibiotics                             Will need to be able to tolerate PO                            Will likely need home health, vs SNF placemnt                                          Would benefit from PT/OT eval prior to DC  Ordered                   Swallow eval - SLP ordered                                      Transition of care consulted                   Nutrition    consulted                  Wound care  consulted                   Palliative care    consulted                              Consults called: none    Admission status:  ED Disposition    ED Disposition Condition Comment   Admit  Hospital Area: [facility] [[id number]]  Level of Care: Med-Surg [[id number]]  May admit patient to [facility] or [facility] if equivalent level of care is available:: No  Covid Evaluation: Confirmed COVID Negative  Diagnosis: UTI (urinary tract infection) [[id number]]  Admitting Physician: Therisa Doyne [[id number]]  Attending Physician: Therisa Doyne [  [id number]]  Estimated length of stay: past midnight tomorrow  Certification:: I certify this patient will need inpatient services for at least 2 midnights          inpatient     I Expect 2 midnight stay secondary to severity of patient's current illness need for inpatient interventions justified by the following:   Severe lab/radiological/exam abnormalities including:    UTI, DVT, sacra decube  and extensive comorbidities including:  DM2   dementia   That are currently affecting medical management.   I expect  patient to be hospitalized for 2 midnights requiring inpatient medical care.  Patient is at high risk for adverse outcome (such as loss of life or  disability) if not treated.  Indication for inpatient stay as follows:  Severe change from baseline regarding mental status   inability to maintain oral hydration     Need for IV antibiotics, IV fluids,     Level of care       medical floor          Lab Results  Component Value Date   SARSCOV2NAA NEGATIVE 03/10/2020     Precautions: admitted as Covid Negative    PPE: Used by the provider:   P100  eye Goggles,  Gloves     Keeshawn Fakhouri 03/11/2020, 1:35 AM    [facility]     after 2 AM please page floor coverage PA If 7AM-7PM, please contact the day team taking care of the patient using [email or web address]   Patient was evaluated in the context of the global COVID-19 pandemic, which necessitated consideration that the patient might be at risk for infection with the SARS-CoV-2 virus that causes COVID-19. Institutional protocols and algorithms that pertain to the evaluation of patients at risk for COVID-19 are in a state of rapid change based on information released by regulatory bodies including the CDC and federal and state organizations. These policies and algorithms were followed during the patient's care.

## 2020-03-11 NOTE — Discharge Instructions (Addendum)
Follow with Primary MD Sharon Seller, NP in 7 days   Get CBC, CMP, 2 view Chest X ray -  checked next visit within 1 week by Primary MD    Activity: As tolerated with Full fall precautions use walker/cane & assistance as needed  Disposition Home   Diet: Soft with feeding assistance and aspiration precautions.   Special Instructions: If you have smoked or chewed Tobacco  in the last 2 yrs please stop smoking, stop any regular Alcohol  and or any Recreational drug use.  On your next visit with your primary care physician please Get Medicines reviewed and adjusted.  Please request your Prim.MD to go over all Hospital Tests and Procedure/Radiological results at the follow up, please get all Hospital records sent to your Prim MD by signing hospital release before you go home.  If you experience worsening of your admission symptoms, develop shortness of breath, life threatening emergency, suicidal or homicidal thoughts you must seek medical attention immediately by calling 911 or calling your MD immediately  if symptoms less severe.  You Must read complete instructions/literature along with all the possible adverse reactions/side effects for all the Medicines you take and that have been prescribed to you. Take any new Medicines after you have completely understood and accpet all the possible adverse reactions/side effects.          Information on my medicine - ELIQUIS (apixaban)  This medication education was reviewed with me or my healthcare representative as part of my discharge preparation.  The pharmacist that spoke with me during my hospital stay was:    Why was Eliquis prescribed for you? Eliquis was prescribed to treat blood clots that may have been found in the veins of your legs (deep vein thrombosis) or in your lungs (pulmonary embolism) and to reduce the risk of them occurring again.  What do You need to know about Eliquis ? The starting dose is 10 mg (two 5 mg  tablets) taken TWICE daily for the FIRST SEVEN (7) DAYS, then on (enter date)  03/17/20 @ 10 PM  the dose is reduced to ONE 5 mg tablet taken TWICE daily.  Eliquis may be taken with or without food.   Try to take the dose about the same time in the morning and in the evening. If you have difficulty swallowing the tablet whole please discuss with your pharmacist how to take the medication safely.  Take Eliquis exactly as prescribed and DO NOT stop taking Eliquis without talking to the doctor who prescribed the medication.  Stopping may increase your risk of developing a new blood clot.  Refill your prescription before you run out.  After discharge, you should have regular check-up appointments with your healthcare provider that is prescribing your Eliquis.    What do you do if you miss a dose? If a dose of ELIQUIS is not taken at the scheduled time, take it as soon as possible on the same day and twice-daily administration should be resumed. The dose should not be doubled to make up for a missed dose.  Important Safety Information A possible side effect of Eliquis is bleeding. You should call your healthcare provider right away if you experience any of the following: ? Bleeding from an injury or your nose that does not stop. ? Unusual colored urine (red or dark brown) or unusual colored stools (red or black). ? Unusual bruising for unknown reasons. ? A serious fall or if you hit your head (even if  there is no bleeding).  Some medicines may interact with Eliquis and might increase your risk of bleeding or clotting while on Eliquis. To help avoid this, consult your healthcare provider or pharmacist prior to using any new prescription or non-prescription medications, including herbals, vitamins, non-steroidal anti-inflammatory drugs (NSAIDs) and supplements.  This website has more information on Eliquis (apixaban): http://www.eliquis.com/eliquis/home

## 2020-03-11 NOTE — ED Notes (Signed)
Patient care assumed. Patient repositioned for comfort and bony prominences supported. Sacral area covered in dressing and it is intact. PIV is SL. Dressing CDI. Linens changed. chux placed under patient. Call bell w/in reach. Lights dimmed. She is oriented to person and place only. Follows commands and neuro sensory intact.

## 2020-03-11 NOTE — Evaluation (Signed)
Physical Therapy Evaluation Patient Details Name: Keymora Grillot MRN: [id number] DOB: 15-Jan-1933 Today's Date: 03/11/2020   History of Present Illness  86yo female presenting with AMS, bedsores, and possible UTI, also with multiple falls OOB recently. PE negative, CTH negative for acute changes. PMH HTN, DM, HLD, obesity  Clinical Impression   Patient received in bed, very pleasantly confused and with significant processing delay and difficulty following cues. Appears to have some possible pressure sores starting to form so floated and relieved pressure from heels. Attempted rolling, however she was physically resistant. Grossly weak today- will require +2 to safely progress to EOB/OOB. Left on ED stretcher with all needs met. Recommend SNF and 24/7A moving forward.     Follow Up Recommendations SNF;Supervision/Assistance - 24 hour    Equipment Recommendations  Other (comment) (defer to next venue)    Recommendations for Other Services       Precautions / Restrictions Precautions Precautions: Fall Restrictions Weight Bearing Restrictions: No      Mobility  Bed Mobility Overal bed mobility: Needs Assistance Bed Mobility: Rolling Rolling: Total assist         General bed mobility comments: totalA to even attempt rolling, physically resisting- will need +2 to progress functional mobility    Transfers                 General transfer comment: deferred- safety  Ambulation/Gait             General Gait Details: deferred- safety  Stairs            Wheelchair Mobility    Modified Rankin (Stroke Patients Only)       Balance Overall balance assessment: History of Falls                                           Pertinent Vitals/Pain Pain Assessment: Faces Faces Pain Scale: Hurts a little bit Pain Location: generalized discomfort with mobility Pain Descriptors / Indicators: Discomfort Pain Intervention(s): Limited activity within  patient's tolerance;Monitored during session    Home Living                   Additional Comments: unsure- limited information from prior charting, and patient very poor historian/unable to provide details. Family not available to clarify.    Prior Function           Comments: per chart, multiple falls OOB recently and had been walking at SNF but mostly bedbound after returning home from the nursing facility     Hand Dominance        Extremity/Trunk Assessment   Upper Extremity Assessment Upper Extremity Assessment: Defer to OT evaluation    Lower Extremity Assessment Lower Extremity Assessment: Generalized weakness    Cervical / Trunk Assessment Cervical / Trunk Assessment: Kyphotic  Communication      Cognition Arousal/Alertness: Awake/alert Behavior During Therapy: Flat affect Overall Cognitive Status: Impaired/Different from baseline Area of Impairment: Orientation;Attention;Memory;Following commands;Safety/judgement;Awareness;Problem solving                 Orientation Level: Disoriented to;Situation;Time Current Attention Level: Sustained Memory: Decreased recall of precautions;Decreased short-term memory Following Commands: Follows one step commands inconsistently;Follows one step commands with increased time Safety/Judgement: Decreased awareness of safety;Decreased awareness of deficits Awareness: Intellectual Problem Solving: Slow processing;Decreased initiation;Difficulty sequencing;Requires verbal cues;Requires tactile cues General Comments: pleasantly confused, very easily distracted and requires multiple  cues/hand over hand assist for cue following even then very inconsistent      General Comments General comments (skin integrity, edema, etc.): will need +2 for EOB/OOB    Exercises     Assessment/Plan    PT Assessment Patient needs continued PT services  PT Problem List Decreased strength;Decreased cognition;Decreased knowledge of  use of DME;Decreased activity tolerance;Decreased safety awareness;Decreased balance;Decreased mobility;Decreased coordination       PT Treatment Interventions DME instruction;Balance training;Gait training;Neuromuscular re-education;Stair training;Functional mobility training;Patient/family education;Therapeutic activities;Therapeutic exercise    PT Goals (Current goals can be found in the Care Plan section)  Acute Rehab PT Goals PT Goal Formulation: Patient unable to participate in goal setting Time For Goal Achievement: 03/25/20 Potential to Achieve Goals: Fair    Frequency Min 2X/week   Barriers to discharge        Co-evaluation               AM-PAC PT "6 Clicks" Mobility  Outcome Measure Help needed turning from your back to your side while in a flat bed without using bedrails?: Total Help needed moving from lying on your back to sitting on the side of a flat bed without using bedrails?: Total Help needed moving to and from a bed to a chair (including a wheelchair)?: Total Help needed standing up from a chair using your arms (e.g., wheelchair or bedside chair)?: Total Help needed to walk in hospital room?: Total Help needed climbing 3-5 steps with a railing? : Total 6 Click Score: 6    End of Session   Activity Tolerance: Patient tolerated treatment well Patient left: in bed;with call bell/phone within reach (ED stretcher) Nurse Communication: Mobility status PT Visit Diagnosis: Unsteadiness on feet (R26.81);Muscle weakness (generalized) (M62.81);Difficulty in walking, not elsewhere classified (R26.2)    Time: 1212-1224 PT Time Calculation (min) (ACUTE ONLY): 12 min   Charges:   PT Evaluation $PT Eval Moderate Complexity: 1 Mod          Windell Norfolk, DPT, PN1   Supplemental Physical Therapist [place]    Pager [phone] Acute Rehab Office [phone]

## 2020-03-11 NOTE — Consult Note (Addendum)
Consultation Note Date: 03/11/2020   Patient Name: Carolyn Bishop  DOB: February 08, 1933  MRN: [id number]  Age / Sex: 86 y.o., female  PCP: Sharon Seller, NP Referring Physician: Maretta Bees, MD  Reason for Consultation: Establishing goals of care  HPI/Patient Profile: 86 y.o. female  with past medical history of dementia, failure to thrive, osteoarthritis, hypertension, hyperlipidemia, and frequent falls. She presented to [facility] emergency department on 03/10/2020 with chief complaint of worsening fatigue, a decubitus ulcer and possible UTI. Also not eating for the past few days.  ED Course: urinalysis showed evidence of UTI (large leukocytes and positive nitrite). Started on rocephin.  Lower extremity ultrasound shows acute DVT involving the right common vein, right femoral vein, and right popliteal vein. Started on Eliquis.Chest x-ray negative. CTA negative for PE.  Patient admitted to [facility] for further management of UTI, DVT, and acute encephalopathy.   Primary decision maker: Patient does not have capacity to make complex medical decisions. Next of kin is her 2 elderly sisters, who also have chronic health issues. They have deferred decision making to patient's niece Dorothy Puffer [phone]  Clinical Assessment and Goals of Care: I have reviewed medical records including EPIC notes, labs and imaging, examined the patient at bedside. Patient is currently alert and calm. She is oriented to person, place, month, and year.   I spoke with niece [facility] by phone to discuss diagnosis, prognosis, GOC, EOL wishes, disposition, and options.  I introduced Palliative Medicine as specialized medical care for people living with serious illness. It focuses on providing relief from the symptoms and stress of a serious illness.   We discussed a brief life review of the patient. She is divorced and does not have  children. For the past year, she has lived with her 2 sisters. They are ages 27 and 72's and have chronic health issues. For this reason, Mellanie has been the main contact and decision maker of the family.   As far as functional and nutritional status, patient has been essentially non-ambulatory since her last admission in August. She has not been eating as much. She is able to feed herself, but needs assistance with most other ADLs. She incontinent of bowel and bladder. Mellanie reports that patient "comes and goes" with her cognitive status, and that she can become very agitated at times.     Mellanie reports that it is becomingly increasingly difficult to care for Ms Pernice at home. They have had private caregivers in the past, but they quit due to the patient's behavioral disturbances. Even though patient is non-ambulatory, she is still mobile enough to slide out of the bed to the floor. The family has had recurrent issues with finding Ms Westerhold on the floor.    We discussed her current illness and what it means in the larger context of her ongoing co-morbidities. Detailed discussion was had regarding the diagnosis of dementia and its natural trajectory. This includes decreased ability to communicate, ambulate, swallow, and maintain continence.   The difference between aggressive medical  intervention and comfort care was considered in light of the patient's goals of care. Mellanie indicates the family would be open to comfort care at some point in the future. The immediate goal of care is to find a safe disposition plan. Mellanie reports they are in the process of obtaining Medicaid for Ms Fernandez and hope to have access to LTC in the near future.    Advanced directives, concepts specific to code status, artifical feeding and hydration, and rehospitalization were considered and discussed. MOST form was introduced.   Hospice and Palliative Care services outpatient were explained and offered. Mellanie is  open to the hospice philosophy, but hospice care in the home would not provide support as it is not 24/7 care. Discussed if disposition plan if SNF for rehab, we can make a referral to outpatient palliative.   Questions and concerns were addressed.  The family was encouraged to call with questions or concerns.    SUMMARY OF RECOMMENDATIONS   - DNR/DNI - continue current medical care - MOST form has been emailed to [facility] for her to review; I will complete this with her over the phone or in person tomorrow - recommend outpatient palliative at discharge - patient would likely be eligible for hospice care if she is approved for LTC in the future and family is open to this - PMT will continue to follow  Code Status/Advance Care Planning:  DNR  Symptom Management:   Per primary team  Palliative Prophylaxis:   Falls precautions, turn/reposition, wound care  Psycho-social/Spiritual:   Created space and opportunity for patient and family to express thoughts and feelings regarding patient's current medical situation.   Emotional support provided    Prognosis:   Difficult to determine, but likely less than 6 months  Discharge Planning: To Be Determined      Primary Diagnoses: Present on Admission: . Pressure injury of skin . UTI (urinary tract infection) . Normocytic anemia . Malnutrition of moderate degree . HLD (hyperlipidemia) . Essential hypertension . Acute encephalopathy . Debility . DVT (deep venous thrombosis) (HCC) . Chronic diastolic CHF (congestive heart failure) (HCC)   I have reviewed the medical record, interviewed the patient and family, and examined the patient. The following aspects are pertinent.  Past Medical History:  Diagnosis Date  . Abnormal gait    Onset 02/05/2015  . Arthritis   . Benign essential hypertension   . Diabetes (HCC)   . Diabetes mellitus type II, controlled, with no complications (HCC)   . HLD (hyperlipidemia)   . HTN  (hypertension)   . Obesity   . Palpitations   . Vitamin D deficiency     Family History  Problem Relation Age of Onset  . Hypertension Mother 42  . Lung cancer Father 17       [employer]   . Heart Problems Sister   . Kidney failure Sister   . Diabetes Sister   . Congestive Heart Failure Sister        CHF   Scheduled Meds: . amLODipine  5 mg Oral Daily  . apixaban  10 mg Oral BID   Followed by  . [START ON 03/17/2020] apixaban  5 mg Oral BID  . cyanocobalamin  1,000 mcg Subcutaneous Daily  . docusate sodium  100 mg Oral BID  . insulin aspart  0-9 Units Subcutaneous Q4H  . memantine  10 mg Oral BID  . thiamine  100 mg Oral Daily   Continuous Infusions: . [START ON 03/12/2020] cefTRIAXone (  ROCEPHIN)  IV     PRN Meds:.acetaminophen **OR** acetaminophen, acetaminophen, HYDROcodone-acetaminophen, ondansetron **OR** ondansetron (ZOFRAN) IV Medications Prior to Admission:  Prior to Admission medications   Medication Sig Start Date End Date Taking? Authorizing Provider  acetaminophen (TYLENOL) 500 MG tablet Take 1,000 mg by mouth every 6 (six) hours as needed for mild pain or headache.   Yes [provider]  amLODipine (NORVASC) 5 MG tablet Take 1 tablet (5 mg total) by mouth daily. 02/05/20 02/04/21 Yes Sharon Seller, NP  memantine (NAMENDA) 10 MG tablet Take 10 mg by mouth 2 (two) times daily.   Yes [provider]  Vitamins A & D (VITAMIN A & D) ointment Apply 1 application topically as needed for dry skin.   Yes [provider]   No Known Allergies Review of Systems  Constitutional: Positive for fatigue.  Cardiovascular: Positive for leg swelling.  Neurological: Positive for weakness.  Psychiatric/Behavioral: Positive for confusion.    Physical Exam Vitals reviewed.  Constitutional:      General: She is not in acute distress.    Comments: Frail appearing  Cardiovascular:     Rate and Rhythm: Normal rate and regular rhythm.    Pulmonary:     Effort: Pulmonary effort is normal.  Neurological:     Mental Status: She is alert and oriented to person, place, and time.     Vital Signs: BP 138/63   Pulse 74   Temp 97.8 F (36.6 C) (Oral)   Resp 17   Ht 5\' 3"  (1.6 m)   Wt 59 kg   SpO2 100%   BMI 23.03 kg/m  Pain Scale: 0-10   Pain Score: 5    SpO2: SpO2: 100 % O2 Device:SpO2: 100 % O2 Flow Rate: .   IO: Intake/output summary: No intake or output data in the 24 hours ending 03/11/20 1403  LBM:   Baseline Weight: Weight: 59 kg Most recent weight: Weight: 59 kg      Palliative Assessment/Data: PPS 30%    Time In: 13:30 Time Out: 14:22 Time Total: 52 minutes Greater than 50%  of this time was spent counseling and coordinating care related to the above assessment and plan.  Signed by: 13/02/21, NP   Please contact Palliative Medicine Team phone at [phone] for questions and concerns.  For individual provider: See [phone]

## 2020-03-11 NOTE — ED Notes (Signed)
Patient assisted to eat breakfast as she was exhibiting confusion with doing it herself.

## 2020-03-11 NOTE — ED Provider Notes (Signed)
I received the patient in signout from Dr. Anitra Lauth.  Briefly the patient is a 86 year old female with a chief complaints of worsening fatigue.  Found to have swollen lower extremity.  Has a DVT PE scan was negative.  Troponin negative.  Awaiting UA.  Urine has returned and appears to be infected.  Positive nitrates and too numerous to count bacteria.  Will start on Rocephin.  Will discuss with medicine for admission.   Melene Plan, DO 03/11/20 [phone]

## 2020-03-11 NOTE — Progress Notes (Addendum)
PROGRESS NOTE        PATIENT DETAILS Name: Carolyn Bishop Age: 86 y.o. Sex: female Date of Birth: 05/21/1932 Admit Date: 03/10/2020 Admitting Physician Therisa Doyne, MD NWG:[id number], Janene Harvey, NP  Brief Narrative: Patient is a 86 y.o. female with dementia, DM-2, HTN-who lives with sisters and a caretaker-presented with poor oral intake, right leg swelling, and worsening confusion superimposed on dementia.  Found to have right leg DVT and UTI.  See below for further details  Significant events: 11/2>> admit for right leg DVT, UTI with encephalopathy.  Significant studies: 11/1>> bilateral lower extremity Doppler-DVT involving the right common femoral vein, right femoral vein, right popliteal vein. 11/1>> chest x-ray: No acute cardiopulmonary abnormality. 11/2>> CT head: No acute intracranial abnormality. 11/2>> vitamin B12: 210  Antimicrobial therapy: None  Microbiology data: 11/1>> urine culture: Pending 11/1>> blood culture: No growth 11/2>> blood culture: Pending  Procedures : None  Consults: None  DVT Prophylaxis : apixaban (ELIQUIS) tablet 10 mg  apixaban (ELIQUIS) tablet 5 mg    Subjective: Pleasantly confused-however able to answer most of my questions appropriately-unknown baseline-no family at bedside..  Not in any distress.  Assessment/Plan: Right lower extremity DVT: Suspect provoked due to limited mobility-continue Eliquis.  Complicated UTI: Continue Rocephin-await cultures.  Afebrile-appears stable.  Sepsis physiology not present on admission-sepsis ruled out.  Acute metabolic encephalopathy superimposed on dementia: Encephalopathy likely due to UTI-seems to have somewhat improved improved compared to her initial presentation.  She was able to follow most of my commands-appears pleasantly confused-however no family at bedside-Baseline is unknown.   CT head without acute abnormalities.  Chronic diastolic heart failure:  Appears euvolemic-follow volume status closely.  Vitamin B12 deficiency: Vitamin B12 levels borderline low-start supplementation.  HTN: BP stable-continue amlodipine-follow and adjust  DM-2 (A1c 6.6 on 11/2): CBG stable-continue SSI-does not appear to be on hypoglycemic agents on di  Recent Labs    03/11/20 0110 03/11/20 0408 03/11/20 0615  GLUCAP 106* 203* 150*   Demenia: Resume Namenda  Moisture associated skin damage in the sacrum: Appreciate wound care evaluation.  Present prior to admission.  Debility/deconditioning: Await PT/OT eval.   Diet: Diet Order            DIET DYS 3 Room service appropriate? Yes; Fluid consistency: Thin  Diet effective now                  Code Status:  DNR  Family Communication: Spoke with Mellanie (Niece [phone]) over phone-11/2  Disposition Plan: Status is: Inpatient  Remains inpatient appropriate because:Inpatient level of care appropriate due to severity of illness   Dispo: The patient is from: Home              Anticipated d/c is to: SNF              Anticipated d/c date is: 2 days              Patient currently is not medically stable to d/c.   Barriers to Discharge: Resolving encephalopathy-on IV antibiotics for complicated UTI-awaiting cultures.  Await rehab services evaluation-May need SNF given her debility.  Antimicrobial agents: Anti-infectives (From admission, onward)   Start     Dose/Rate Route Frequency Ordered Stop   03/12/20 0100  cefTRIAXone (ROCEPHIN) 1 g in sodium chloride 0.9 % 100 mL IVPB  1 g 200 mL/hr over 30 Minutes Intravenous Every 24 hours 03/11/20 0817     03/11/20 0030  cefTRIAXone (ROCEPHIN) 1 g in sodium chloride 0.9 % 100 mL IVPB        1 g 200 mL/hr over 30 Minutes Intravenous  Once 03/11/20 0025 03/11/20 0217       Time spent: 25 minutes-Greater than 50% of this time was spent in counseling, explanation of diagnosis, planning of further management, and coordination of  care.  MEDICATIONS: Scheduled Meds: . amLODipine  5 mg Oral Daily  . apixaban  10 mg Oral BID   Followed by  . [START ON 03/17/2020] apixaban  5 mg Oral BID  . cyanocobalamin  1,000 mcg Subcutaneous Daily  . docusate sodium  100 mg Oral BID  . insulin aspart  0-9 Units Subcutaneous Q4H  . thiamine  100 mg Oral Daily   Continuous Infusions: . sodium chloride 75 mL/hr (03/11/20 0836)  . [START ON 03/12/2020] cefTRIAXone (ROCEPHIN)  IV     PRN Meds:.acetaminophen **OR** acetaminophen, acetaminophen, HYDROcodone-acetaminophen, ondansetron **OR** ondansetron (ZOFRAN) IV   PHYSICAL EXAM: Vital signs: Vitals:   03/11/20 0800 03/11/20 0845 03/11/20 0900 03/11/20 0902  BP:  (!) 163/74 (!) 151/71 (!) 151/71  Pulse: 67 69 68   Resp: 16 14 17    Temp: 97.8 F (36.6 C)     TempSrc: Oral     SpO2: 99% 98% 99%   Weight:      Height:       Filed Weights   03/10/20 1724  Weight: 59 kg   Body mass index is 23.03 kg/m.   Gen Exam: Pleasantly confused-not in any distress. HEENT:atraumatic, normocephalic Chest: B/L clear to auscultation anteriorly CVS:S1S2 regular Abdomen:soft non tender, non distended Extremities: Mild swelling in the right lower extremity. Neurology: Difficult exam-but seems to be moving all 4 extremities. Skin: no rash  I have personally reviewed following labs and imaging studies  LABORATORY DATA: CBC: Recent Labs  Lab 03/10/20 2009  WBC 7.1  NEUTROABS 4.8  HGB 10.8*  HCT 33.2*  MCV 93.0  PLT 242    Basic Metabolic Panel: Recent Labs  Lab 03/10/20 2009 03/11/20 0348  NA 140  --   K 3.8  --   CL 108  --   CO2 22  --   GLUCOSE 135*  --   BUN 25*  --   CREATININE 0.76  --   CALCIUM 9.1  --   MG  --  1.6*  PHOS  --  3.1    GFR: Estimated Creatinine Clearance: 42.5 mL/min (by C-G formula based on SCr of 0.76 mg/dL).  Liver Function Tests: Recent Labs  Lab 03/10/20 2009  AST 17  ALT 12  ALKPHOS 51  BILITOT 0.5  PROT 6.0*  ALBUMIN  2.6*   No results for input(s): LIPASE, AMYLASE in the last 168 hours. No results for input(s): AMMONIA in the last 168 hours.  Coagulation Profile: No results for input(s): INR, PROTIME in the last 168 hours.  Cardiac Enzymes: Recent Labs  Lab 03/11/20 0348  CKTOTAL 60    BNP (last 3 results) No results for input(s): PROBNP in the last 8760 hours.  Lipid Profile: No results for input(s): CHOL, HDL, LDLCALC, TRIG, CHOLHDL, LDLDIRECT in the last 72 hours.  Thyroid Function Tests: No results for input(s): TSH, T4TOTAL, FREET4, T3FREE, THYROIDAB in the last 72 hours.  Anemia Panel: Recent Labs    03/11/20 0348  VITAMINB12 210  FOLATE 8.7  FERRITIN 100  TIBC 216*  IRON 56  RETICCTPCT 1.2    Urine analysis:    Component Value Date/Time   COLORURINE AMBER (A) 03/10/2020 1821   APPEARANCEUR TURBID (A) 03/10/2020 1821   LABSPEC 1.033 (H) 03/10/2020 1821   PHURINE 5.0 03/10/2020 1821   GLUCOSEU NEGATIVE 03/10/2020 1821   HGBUR SMALL (A) 03/10/2020 1821   BILIRUBINUR NEGATIVE 03/10/2020 1821   KETONESUR NEGATIVE 03/10/2020 1821   PROTEINUR 30 (A) 03/10/2020 1821   UROBILINOGEN 0.2 02/21/2015 2355   NITRITE POSITIVE (A) 03/10/2020 1821   LEUKOCYTESUR LARGE (A) 03/10/2020 1821    Sepsis Labs: Lactic Acid, Venous    Component Value Date/Time   LATICACIDVEN 1.4 03/10/2020 2009    MICROBIOLOGY: Recent Results (from the past 240 hour(s))  Respiratory Panel by RT PCR (Flu A&B, Covid) - Nasopharyngeal Swab     Status: None   Collection Time: 03/10/20  6:48 PM   Specimen: Nasopharyngeal Swab  Result Value Ref Range Status   SARS Coronavirus 2 by RT PCR NEGATIVE NEGATIVE Final    Comment: (NOTE) SARS-CoV-2 target nucleic acids are NOT DETECTED.  The SARS-CoV-2 RNA is generally detectable in upper respiratoy specimens during the acute phase of infection. The lowest concentration of SARS-CoV-2 viral copies this assay can detect is 131 copies/mL. A negative result  does not preclude SARS-Cov-2 infection and should not be used as the sole basis for treatment or other patient management decisions. A negative result may occur with  improper specimen collection/handling, submission of specimen other than nasopharyngeal swab, presence of viral mutation(s) within the areas targeted by this assay, and inadequate number of viral copies (<131 copies/mL). A negative result must be combined with clinical observations, patient history, and epidemiological information. The expected result is Negative.  Fact Sheet for Patients:  https://www.moore.com/  Fact Sheet for Healthcare Providers:  https://www.young.biz/  This test is no t yet approved or cleared by the [place] FDA and  has been authorized for detection and/or diagnosis of SARS-CoV-2 by FDA under an Emergency Use Authorization (EUA). This EUA will remain  in effect (meaning this test can be used) for the duration of the COVID-19 declaration under Section 564(b)(1) of the Act, 21 U.S.C. section 360bbb-3(b)(1), unless the authorization is terminated or revoked sooner.     Influenza A by PCR NEGATIVE NEGATIVE Final   Influenza B by PCR NEGATIVE NEGATIVE Final    Comment: (NOTE) The Xpert Xpress SARS-CoV-2/FLU/RSV assay is intended as an aid in  the diagnosis of influenza from Nasopharyngeal swab specimens and  should not be used as a sole basis for treatment. Nasal washings and  aspirates are unacceptable for Xpert Xpress SARS-CoV-2/FLU/RSV  testing.  Fact Sheet for Patients: https://www.moore.com/  Fact Sheet for Healthcare Providers: https://www.young.biz/  This test is not yet approved or cleared by the [place] FDA and  has been authorized for detection and/or diagnosis of SARS-CoV-2 by  FDA under an Emergency Use Authorization (EUA). This EUA will remain  in effect (meaning this test can be used) for  the duration of the  Covid-19 declaration under Section 564(b)(1) of the Act, 21  U.S.C. section 360bbb-3(b)(1), unless the authorization is  terminated or revoked. Performed at [facility], [address] [place] [place], [place] [address]   Culture, blood (Routine X 2) w Reflex to ID Panel     Status: None (Preliminary result)   Collection Time: 03/10/20  8:09 PM   Specimen: BLOOD RIGHT FOREARM  Result Value Ref Range  Status   Specimen Description BLOOD RIGHT FOREARM  Final   Special Requests   Final    BOTTLES DRAWN AEROBIC AND ANAEROBIC Blood Culture adequate volume   Culture   Final    NO GROWTH < 12 HOURS Performed at [name], [address], [place], [place] [address]    Report Status PENDING  Incomplete    RADIOLOGY STUDIES/RESULTS: CT HEAD WO CONTRAST  Result Date: 03/11/2020 CLINICAL DATA:  Altered mental status, head trauma EXAM: CT HEAD WITHOUT CONTRAST TECHNIQUE: Contiguous axial images were obtained from the base of the skull through the vertex without intravenous contrast. COMPARISON:  12/20/2019 FINDINGS: Brain: There is atrophy and chronic small vessel disease changes. No acute intracranial abnormality. Specifically, no hemorrhage, hydrocephalus, mass lesion, acute infarction, or significant intracranial injury. Vascular: No hyperdense vessel or unexpected calcification. Skull: No acute calvarial abnormality. Sinuses/Orbits: Visualized paranasal sinuses and mastoids clear. Orbital soft tissues unremarkable. Other: None IMPRESSION: Atrophy, chronic microvascular disease. No acute intracranial abnormality. Electronically Signed   By: Charlett Nose M.D.   On: 03/11/2020 01:41   CT Angio Chest PE W and/or Wo Contrast  Result Date: 03/10/2020 CLINICAL DATA:  Pulmonary embolism EXAM: CT ANGIOGRAPHY CHEST WITH CONTRAST TECHNIQUE: Multidetector CT imaging of the chest was performed using the standard protocol during bolus administration of intravenous contrast.  Multiplanar CT image reconstructions and MIPs were obtained to evaluate the vascular anatomy. CONTRAST:  OMNIPAQUE IOHEXOL 350 MG/ML SOLN COMPARISON:  None. FINDINGS: Cardiovascular: There is excellent opacification of the pulmonary arterial tree. There is no intraluminal filling defect to suggest acute pulmonary embolism. The central pulmonary arteries are of normal caliber. Moderate coronary artery calcification. Global cardiac size is within normal limits. Marked left ventricular hypertrophy noted. No pericardial effusion. Moderate atherosclerotic calcification is seen within the descending thoracic aorta. No aortic aneurysm. Mediastinum/Nodes: No pathologic thoracic adenopathy. Thyroid unremarkable. Small hiatal hernia. Lungs/Pleura: Benign 13 mm left basilar pleural lipoma. Lungs are clear. No pneumothorax or pleural effusion. Central airways are widely patent. Upper Abdomen: Multiple hyperdense lesions are seen within the visualized upper pole of the left kidney, incompletely evaluated on this examination. Moderate stool noted within the upper abdomen. Musculoskeletal: No acute bone abnormality. Review of the MIP images confirms the above findings. IMPRESSION: No pulmonary embolism. Marked left ventricular hypertrophy. Moderate coronary artery calcification. Incompletely evaluated hyperdense lesions within the upper pole of the left kidney. If indicated, these could be better assessed with dedicated renal sonography. Aortic Atherosclerosis (ICD10-I70.0). Electronically Signed   By: Helyn Numbers MD   On: 03/10/2020 22:58   DG Chest Port 1 View  Result Date: 03/10/2020 CLINICAL DATA:  Weakness, fever.  Concern for sepsis. EXAM: PORTABLE CHEST 1 VIEW.  Patient is rotated. COMPARISON:  Chest x-ray 12/20/2019 FINDINGS: The heart size and mediastinal contours are within normal limits. Lucent right lung compared to the left likely due to patient rotation. En facet vessel identified within the lower/mid  lung zone. No focal consolidation. No pulmonary edema. No pleural effusion. No pneumothorax. No acute osseous abnormality. IMPRESSION: No acute cardiopulmonary abnormality. Electronically Signed   By: Tish Frederickson M.D.   On: 03/10/2020 18:43   VAS [place] LOWER EXTREMITY VENOUS (DVT) (ONLY [facility] & [facility] 7a-7p)  Result Date: 03/10/2020  Lower Venous DVT Study Indications: Edema.  Risk Factors: None identified. Limitations: Poor ultrasound/tissue interface and patient positioning, patient immobility, patient pain tolerance. Comparison Study: No prior studies. Performing Technologist: Chanda Busing RVT  Examination Guidelines: A complete evaluation  includes B-mode imaging, spectral Doppler, color Doppler, and power Doppler as needed of all accessible portions of each vessel. Bilateral testing is considered an integral part of a complete examination. Limited examinations for reoccurring indications may be performed as noted. The reflux portion of the exam is performed with the patient in reverse Trendelenburg.  +---------+---------------+---------+-----------+----------+-----------------+ RIGHT    CompressibilityPhasicitySpontaneityPropertiesThrombus Aging    +---------+---------------+---------+-----------+----------+-----------------+ CFV      Partial        No       No                   Age Indeterminate +---------+---------------+---------+-----------+----------+-----------------+ SFJ      Full                                                           +---------+---------------+---------+-----------+----------+-----------------+ FV Prox  Partial        No       No                   Age Indeterminate +---------+---------------+---------+-----------+----------+-----------------+ FV Mid   Partial        No       No                   Age Indeterminate +---------+---------------+---------+-----------+----------+-----------------+ FV DistalPartial        No       No                    Age Indeterminate +---------+---------------+---------+-----------+----------+-----------------+ PFV                                                   Not visualized    +---------+---------------+---------+-----------+----------+-----------------+ POP      None           No       No                   Age Indeterminate +---------+---------------+---------+-----------+----------+-----------------+ PTV      Full                                                           +---------+---------------+---------+-----------+----------+-----------------+ PERO     Full                                                           +---------+---------------+---------+-----------+----------+-----------------+ Gastroc  Full                                                           +---------+---------------+---------+-----------+----------+-----------------+ SSV      None  Age Indeterminate +---------+---------------+---------+-----------+----------+-----------------+ EIV                     No       No                                     +---------+---------------+---------+-----------+----------+-----------------+ CIV                                                   Not visualized    +---------+---------------+---------+-----------+----------+-----------------+   +----+---------------+---------+-----------+----------+--------------+ LEFTCompressibilityPhasicitySpontaneityPropertiesThrombus Aging +----+---------------+---------+-----------+----------+--------------+ CFV Full           Yes      Yes                                 +----+---------------+---------+-----------+----------+--------------+     Summary: RIGHT: - Findings consistent with acute deep vein thrombosis involving the right common femoral vein, right femoral vein, and right popliteal vein. - Findings consistent with acute superficial vein thrombosis  involving the right small saphenous vein. - No cystic structure found in the popliteal fossa. - The external iliac vein appears patent. Unable to insonate common iliac vein due to overlying bowel gas, and patient positioning.  LEFT: - No evidence of common femoral vein obstruction.  *See table(s) above for measurements and observations. Electronically signed by Fabienne Bruns MD on 03/10/2020 at 7:12:36 PM.    Final      LOS: 0 days   Jeoffrey Massed, MD  [facility]    To contact the attending provider between 7A-7P or the covering provider during after hours 7P-7A, please log into the web site [email or web address] and access using universal [place] password for that web site. If you do not have the password, please call the hospital operator.  03/11/2020, 10:25 AM

## 2020-03-12 ENCOUNTER — Encounter (HOSPITAL_COMMUNITY): Payer: Self-pay | Admitting: Internal Medicine

## 2020-03-12 LAB — BASIC METABOLIC PANEL
Anion gap: 11 (ref 5–15)
BUN: 22 mg/dL (ref 8–23)
CO2: 22 mmol/L (ref 22–32)
Calcium: 9.1 mg/dL (ref 8.9–10.3)
Chloride: 106 mmol/L (ref 98–111)
Creatinine, Ser: 0.69 mg/dL (ref 0.44–1.00)
GFR, Estimated: >60 mL/min (ref >60)
Glucose, Bld: 140 mg/dL — ABNORMAL HIGH (ref 70–99)
Potassium: 3.5 mmol/L (ref 3.5–5.1)
Sodium: 139 mmol/L (ref 135–145)

## 2020-03-12 LAB — CBC
HCT: 35.5 % — ABNORMAL LOW (ref 36.0–46.0)
Hemoglobin: 11.3 g/dL — ABNORMAL LOW (ref 12.0–15.0)
MCH: 29.1 pg (ref 26.0–34.0)
MCHC: 31.8 g/dL (ref 30.0–36.0)
MCV: 91.5 fL (ref 80.0–100.0)
Platelets: 258 10*3/uL (ref 150–400)
RBC: 3.88 MIL/uL (ref 3.87–5.11)
RDW: 13.2 % (ref 11.5–15.5)
WBC: 7.7 10*3/uL (ref 4.0–10.5)
nRBC: 0 % (ref 0.0–0.2)

## 2020-03-12 LAB — GLUCOSE, CAPILLARY
Glucose-Capillary: 122 mg/dL — ABNORMAL HIGH (ref 70–99)
Glucose-Capillary: 138 mg/dL — ABNORMAL HIGH (ref 70–99)
Glucose-Capillary: 170 mg/dL — ABNORMAL HIGH (ref 70–99)
Glucose-Capillary: 151 mg/dL — ABNORMAL HIGH (ref 70–99)
Glucose-Capillary: 192 mg/dL — ABNORMAL HIGH (ref 70–99)

## 2020-03-12 MED ORDER — AMLODIPINE BESYLATE 10 MG PO TABS
10.0000 mg | ORAL_TABLET | Freq: Every day | ORAL | Status: DC
  Administered 2020-03-13 – 2020-03-17 (×5): 10 mg via ORAL
  Filled 2020-03-12 (×5): qty 1

## 2020-03-12 MED ORDER — ORAL CARE MOUTH RINSE
15.0000 mL | Freq: Two times a day (BID) | OROMUCOSAL | Status: DC
  Administered 2020-03-12 – 2020-03-16 (×9): 15 mL via OROMUCOSAL

## 2020-03-12 MED ORDER — ENSURE ENLIVE PO LIQD
237.0000 mL | Freq: Two times a day (BID) | ORAL | Status: DC
  Administered 2020-03-13 – 2020-03-17 (×8): 237 mL via ORAL

## 2020-03-12 MED ORDER — ADULT MULTIVITAMIN W/MINERALS CH
1.0000 | ORAL_TABLET | Freq: Every day | ORAL | Status: DC
  Administered 2020-03-12 – 2020-03-17 (×6): 1 via ORAL
  Filled 2020-03-12 (×6): qty 1

## 2020-03-12 NOTE — Progress Notes (Signed)
Daily Progress Note   Patient Name: Carolyn Bishop       Date: 03/12/2020 DOB: August 26, 1932  Age: 86 y.o. MRN#: [id number] Attending Physician: Jonetta Osgood, MD Primary Care Physician: Lauree Chandler, NP Admit Date: 03/10/2020  Reason for Follow-up: continued [place] discussion  Primary decision maker: Patient does not have capacity to make complex medical decisions. Next of kin is her 2 elderly sisters, who also have chronic health issues. They have deferred decision making to patient's niece Darlyn Read [phone]  Subjective: I spoke with niece [facility]) by phone. Discussed disposition plan for SNF rehab as recommended by PT/OT. Discussed that this will give the family more time to decide on a more permanent plan for [name]'s care. Mellanie again expresses that [name] can no longer safety live at home with her sisters. She also shares that they have applied for Medicaid as this will provide more options for care.   Recommended an outpatient palliative referral to check-in periodically with patient and family and continue goals of care discussions and can assist with possible transition to hospice care at some point in the future. Discussed that hospice care can be provided in long term care, but not while patients are receiving rehab.    I completed a MOST form today. The  family outlined their wishes for the following treatment decisions:  Cardiopulmonary Resuscitation: Do Not Attempt Resuscitation (DNR/No CPR)  Medical Interventions: Comfort Measures: Keep clean, warm, and dry. Use medication by any route, positioning, wound care, and other measures to relieve pain and suffering. Use oxygen, suction and manual treatment of airway obstruction as needed for comfort. Do not transfer  to the hospital unless comfort needs cannot be met in current location.  Antibiotics: Antibiotics if indicated  IV Fluids: IV fluids for a defined trial period  Feeding Tube: No feeding tube     Length of Stay: 1  Current Medications: Scheduled Meds:  . [START ON 03/13/2020] amLODipine  10 mg Oral Daily  . apixaban  10 mg Oral BID   Followed by  . [START ON 03/17/2020] apixaban  5 mg Oral BID  . cyanocobalamin  1,000 mcg Subcutaneous Daily  . docusate sodium  100 mg Oral BID  . [START ON 03/13/2020] feeding supplement  237 mL Oral BID BM  . insulin aspart  0-9 Units Subcutaneous Q4H  . mouth rinse  15 mL Mouth Rinse BID  . memantine  10 mg Oral BID  . multivitamin with minerals  1 tablet Oral Daily  . thiamine  100 mg Oral Daily    Continuous Infusions: . cefTRIAXone (ROCEPHIN)  IV 1 g (03/12/20 0347)    PRN Meds: acetaminophen **OR** acetaminophen, acetaminophen, HYDROcodone-acetaminophen, ondansetron **OR** ondansetron (ZOFRAN) IV  Physical Exam Constitutional:      General: She is sleeping. She is not in acute distress.    Comments: Frail appearing  Cardiovascular:     Rate and Rhythm: Normal rate.  Pulmonary:     Effort: Pulmonary effort is normal.             Vital Signs: BP (!) 173/82 (BP Location: Right Arm)   Pulse 77   Temp 97.6 F (36.4 C) (Oral)   Resp 14   Ht $R'5\' 3"'Vt$  (1.6 m)   Wt 59 kg   SpO2 98%   BMI 23.03 kg/m  SpO2: SpO2: 98 % O2 Device: O2 Device: Room Air O2 Flow Rate:    Intake/output summary:   Intake/Output Summary (Last 24 hours) at 03/12/2020 1745 Last data filed at 03/12/2020 1500 Gross per 24 hour  Intake 580 ml  Output --  Net 580 ml   LBM:   Baseline Weight: Weight: 59 kg Most recent weight: Weight: 59 kg       Palliative Assessment/Data: PPS 30%       Palliative Care Assessment & Plan   HPI/Patient Profile: 86 y.o. female  with past medical history of dementia, failure to thrive, osteoarthritis, chronic diastolic  heart failure, hypertension, hyperlipidemia, and frequent falls. She presented to [facility] emergency department on 03/10/2020 with chief complaint of worsening fatigue, poor oral intake, a decubitus ulcer and possible UTI.  ED Course: urinalysis showed evidence of UTI (large leukocytes and positive nitrite). Started on rocephin.  Lower extremity ultrasound shows acute DVT involving the right common vein, right femoral vein, and right popliteal vein. Started on Eliquis.Chest x-ray negative. CTA negative for PE.  Patient admitted to [facility] for further management of UTI, DVT, and acute encephalopathy.   Assessment: - dementia with history of behavioral disturbance - acute encephalopathy - UTI - right leg DVT - pressure ulcer - moderate malnutrition in the context of chronic illness  Recommendations/Plan: - DNR/DNI as previously documented - MOST form completed electronically and paper copy placed on shadow chart - recommend outpatient palliative care at discharge - Family is open to transition to hospice when patient is able to receive long term care status  Goals of Care and Additional Recommendations:  Limitations on Scope of Treatment: No Artificial Feeding  Code Status: DNR/DNI  Prognosis:   difficult to determine but likely less than 6 months  Discharge Planning:  [facility] for rehab with Palliative care service follow-up    Thank you for allowing the Palliative Medicine Team to assist in the care of this patient.   Total Time 35 minutes Prolonged Time Billed  no       Greater than 50%  of this time was spent counseling and coordinating care related to the above assessment and plan.  Lavena Bullion, NP  Please contact Palliative Medicine Team phone at [phone] for questions and concerns.

## 2020-03-12 NOTE — Progress Notes (Signed)
PROGRESS NOTE        PATIENT DETAILS Name: Carolyn Bishop Age: 86 y.o. Sex: female Date of Birth: 1932/08/04 Admit Date: 03/10/2020 Admitting Physician Therisa Doyne, MD [id number], Janene Harvey, NP  Brief Narrative: Patient is a 86 y.o. female with dementia, DM-2, HTN-who lives with sisters and a caretaker-presented with poor oral intake, right leg swelling, and worsening confusion superimposed on dementia.  Found to have right leg DVT and UTI.  See below for further details  Significant events: 11/2>> admit for right leg DVT, UTI with encephalopathy.  Significant studies: 11/1>> bilateral lower extremity Doppler-DVT involving the right common femoral vein, right femoral vein, right popliteal vein. 11/1>> chest x-ray: No acute cardiopulmonary abnormality. 11/2>> CT head: No acute intracranial abnormality. 11/2>> vitamin B12: 210  Antimicrobial therapy: Rocephin: 11/1>>  Microbiology data: 11/1>> urine culture:> 100,000 colonies/ml of gram neg rods 11/1>> blood culture: No growth 11/2>> blood culture: No growth  Procedures : None  Consults: None  DVT Prophylaxis : apixaban (ELIQUIS) tablet 10 mg  apixaban (ELIQUIS) tablet 5 mg    Subjective: Mental status seems to have improved even more compared to yesterday-following commands-answering almost all of my questions appropriately-at times she just requires simple redirection.  Per nursing staff-no major issues overnight.  Assessment/Plan: Right lower extremity DVT: Suspect provoked due to limited mobility-continue Eliquis.  Complicated UTI: Afebrile-clinically improved-urine cultures-growing gram-negative rods-remains on Rocephin-await cultures. Sepsis physiology not present on admission-sepsis ruled out.  Acute metabolic encephalopathy superimposed on dementia: Seems to have improved-unknown baseline but clearly better.  Encephalopathy likely secondary to UTI.  CT head without acute  abnormalities.  Chronic diastolic heart failure: Appears euvolemic-follow volume status closely.  Vitamin B12 deficiency: Vitamin B12 levels borderline low-continue supplementation.  HTN: BP uncontrolled-increase amlodipine to 10 mg-follow and adjust.    DM-2 (A1c 6.6 on 11/2): CBG stable-continue SSI-does not appear to be on hypoglycemic agents on di  Recent Labs    03/12/20 0334 03/12/20 0800 03/12/20 1145  GLUCAP 122* 138* 170*   Demenia: Resume Namenda  Moisture associated skin damage in the sacrum: Appreciate wound care evaluation.  Present prior to admission.  Debility/deconditioning: Appreciate PT/OT eval-SNF recommended.  Social worker consulted.   Diet: Diet Order            DIET DYS 3 Room service appropriate? Yes; Fluid consistency: Thin  Diet effective now                  Code Status:  DNR  Family Communication: Spoke with Mellanie (Niece [phone]) over phone-11/3  Disposition Plan: Status is: Inpatient  Remains inpatient appropriate because:Inpatient level of care appropriate due to severity of illness   Dispo: The patient is from: Home              Anticipated d/c is to: SNF              Anticipated d/c date is: 2 days              Patient currently is not medically stable to d/c.   Barriers to Discharge: Resolving encephalopathy-on IV antibiotics for complicated UTI-awaiting cultures.  Await rehab services evaluation-May need SNF given her debility.  Antimicrobial agents: Anti-infectives (From admission, onward)   Start     Dose/Rate Route Frequency Ordered Stop   03/12/20 0100  cefTRIAXone (ROCEPHIN) 1 g in sodium  chloride 0.9 % 100 mL IVPB        1 g 200 mL/hr over 30 Minutes Intravenous Every 24 hours 03/11/20 0817     03/11/20 0030  cefTRIAXone (ROCEPHIN) 1 g in sodium chloride 0.9 % 100 mL IVPB        1 g 200 mL/hr over 30 Minutes Intravenous  Once 03/11/20 0025 03/11/20 0217       Time spent: 25 minutes-Greater than 50% of  this time was spent in counseling, explanation of diagnosis, planning of further management, and coordination of care.  MEDICATIONS: Scheduled Meds: . amLODipine  5 mg Oral Daily  . apixaban  10 mg Oral BID   Followed by  . [START ON 03/17/2020] apixaban  5 mg Oral BID  . cyanocobalamin  1,000 mcg Subcutaneous Daily  . docusate sodium  100 mg Oral BID  . insulin aspart  0-9 Units Subcutaneous Q4H  . mouth rinse  15 mL Mouth Rinse BID  . memantine  10 mg Oral BID  . thiamine  100 mg Oral Daily   Continuous Infusions: . cefTRIAXone (ROCEPHIN)  IV 1 g (03/12/20 0347)   PRN Meds:.acetaminophen **OR** acetaminophen, acetaminophen, HYDROcodone-acetaminophen, ondansetron **OR** ondansetron (ZOFRAN) IV   PHYSICAL EXAM: Vital signs: Vitals:   03/11/20 2045 03/11/20 2149 03/12/20 0300 03/12/20 0754  BP: (!) 138/99 (!) 140/91 134/81 (!) 173/82  Pulse: 89 84 81 77  Resp:  17 16 14   Temp:  98.2 F (36.8 C) 98.1 F (36.7 C) 97.6 F (36.4 C)  TempSrc:  Oral Axillary Oral  SpO2: 96% 93% 95% 98%  Weight:      Height:       Filed Weights   03/10/20 1724  Weight: 59 kg   Body mass index is 23.03 kg/m.   Gen Exam:Alert awake-not in any distress HEENT:atraumatic, normocephalic Chest: B/L clear to auscultation anteriorly CVS:S1S2 regular Abdomen:soft non tender, non distended Extremities:no edema Neurology: Non focal Skin: no rash  I have personally reviewed following labs and imaging studies  LABORATORY DATA: CBC: Recent Labs  Lab 03/10/20 2009 03/11/20 1033 03/12/20 0132  WBC 7.1 6.1 7.7  NEUTROABS 4.8 3.9  --   HGB 10.8* 10.4* 11.3*  HCT 33.2* 32.7* 35.5*  MCV 93.0 94.2 91.5  PLT 242 236 258    Basic Metabolic Panel: Recent Labs  Lab 03/10/20 2009 03/11/20 0348 03/11/20 1033 03/12/20 0132  NA 140  --  139 139  K 3.8  --  3.6 3.5  CL 108  --  105 106  CO2 22  --  25 22  GLUCOSE 135*  --  217* 140*  BUN 25*  --  20 22  CREATININE 0.76  --  0.68 0.69    CALCIUM 9.1  --  8.7* 9.1  MG  --  1.6*  --   --   PHOS  --  3.1  --   --     GFR: Estimated Creatinine Clearance: 42.5 mL/min (by C-G formula based on SCr of 0.69 mg/dL).  Liver Function Tests: Recent Labs  Lab 03/10/20 2009 03/11/20 1033  AST 17 16  ALT 12 10  ALKPHOS 51 47  BILITOT 0.5 0.3  PROT 6.0* 5.5*  ALBUMIN 2.6* 2.3*   No results for input(s): LIPASE, AMYLASE in the last 168 hours. No results for input(s): AMMONIA in the last 168 hours.  Coagulation Profile: No results for input(s): INR, PROTIME in the last 168 hours.  Cardiac Enzymes: Recent Labs  Lab 03/11/20  0348  CKTOTAL 60    BNP (last 3 results) No results for input(s): PROBNP in the last 8760 hours.  Lipid Profile: No results for input(s): CHOL, HDL, LDLCALC, TRIG, CHOLHDL, LDLDIRECT in the last 72 hours.  Thyroid Function Tests: No results for input(s): TSH, T4TOTAL, FREET4, T3FREE, THYROIDAB in the last 72 hours.  Anemia Panel: Recent Labs    03/11/20 0348  VITAMINB12 210  FOLATE 8.7  FERRITIN 100  TIBC 216*  IRON 56  RETICCTPCT 1.2    Urine analysis:    Component Value Date/Time   COLORURINE AMBER (A) 03/10/2020 1821   APPEARANCEUR TURBID (A) 03/10/2020 1821   LABSPEC 1.033 (H) 03/10/2020 1821   PHURINE 5.0 03/10/2020 1821   GLUCOSEU NEGATIVE 03/10/2020 1821   HGBUR SMALL (A) 03/10/2020 1821   BILIRUBINUR NEGATIVE 03/10/2020 1821   KETONESUR NEGATIVE 03/10/2020 1821   PROTEINUR 30 (A) 03/10/2020 1821   UROBILINOGEN 0.2 02/21/2015 2355   NITRITE POSITIVE (A) 03/10/2020 1821   LEUKOCYTESUR LARGE (A) 03/10/2020 1821    Sepsis Labs: Lactic Acid, Venous    Component Value Date/Time   LATICACIDVEN 1.4 03/10/2020 2009    MICROBIOLOGY: Recent Results (from the past 240 hour(s))  Urine Culture     Status: Abnormal (Preliminary result)   Collection Time: 03/10/20  6:21 PM   Specimen: Urine, Catheterized  Result Value Ref Range Status   Specimen Description URINE,  CATHETERIZED  Final   Special Requests NONE  Final   Culture (A)  Final    >=100,000 COLONIES/mL GRAM NEGATIVE RODS SUSCEPTIBILITIES TO FOLLOW Performed at [facility], [address] [place], [place] [address]    Report Status PENDING  Incomplete  Respiratory Panel by RT PCR (Flu A&B, Covid) - Nasopharyngeal Swab     Status: None   Collection Time: 03/10/20  6:48 PM   Specimen: Nasopharyngeal Swab  Result Value Ref Range Status   SARS Coronavirus 2 by RT PCR NEGATIVE NEGATIVE Final    Comment: (NOTE) SARS-CoV-2 target nucleic acids are NOT DETECTED.  The SARS-CoV-2 RNA is generally detectable in upper respiratoy specimens during the acute phase of infection. The lowest concentration of SARS-CoV-2 viral copies this assay can detect is 131 copies/mL. A negative result does not preclude SARS-Cov-2 infection and should not be used as the sole basis for treatment or other patient management decisions. A negative result may occur with  improper specimen collection/handling, submission of specimen other than nasopharyngeal swab, presence of viral mutation(s) within the areas targeted by this assay, and inadequate number of viral copies (<131 copies/mL). A negative result must be combined with clinical observations, patient history, and epidemiological information. The expected result is Negative.  Fact Sheet for Patients:  https://www.moore.com/https://www.fda.gov/media/142436/download  Fact Sheet for Healthcare Providers:  https://www.young.biz/https://www.fda.gov/media/142435/download  This test is no t yet approved or cleared by the [place] FDA and  has been authorized for detection and/or diagnosis of SARS-CoV-2 by FDA under an Emergency Use Authorization (EUA). This EUA will remain  in effect (meaning this test can be used) for the duration of the COVID-19 declaration under Section 564(b)(1) of the Act, 21 U.S.C. section 360bbb-3(b)(1), unless the authorization is terminated or revoked sooner.      Influenza A by PCR NEGATIVE NEGATIVE Final   Influenza B by PCR NEGATIVE NEGATIVE Final    Comment: (NOTE) The Xpert Xpress SARS-CoV-2/FLU/RSV assay is intended as an aid in  the diagnosis of influenza from Nasopharyngeal swab specimens and  should not be used as  a sole basis for treatment. Nasal washings and  aspirates are unacceptable for Xpert Xpress SARS-CoV-2/FLU/RSV  testing.  Fact Sheet for Patients: https://www.moore.com/  Fact Sheet for Healthcare Providers: https://www.young.biz/  This test is not yet approved or cleared by the [place] FDA and  has been authorized for detection and/or diagnosis of SARS-CoV-2 by  FDA under an Emergency Use Authorization (EUA). This EUA will remain  in effect (meaning this test can be used) for the duration of the  Covid-19 declaration under Section 564(b)(1) of the Act, 21  U.S.C. section 360bbb-3(b)(1), unless the authorization is  terminated or revoked. Performed at [facility], [address], [place], [place] [address]   Culture, blood (Routine X 2) w Reflex to ID Panel     Status: None (Preliminary result)   Collection Time: 03/10/20  8:09 PM   Specimen: BLOOD RIGHT FOREARM  Result Value Ref Range Status   Specimen Description BLOOD RIGHT FOREARM  Final   Special Requests   Final    BOTTLES DRAWN AEROBIC AND ANAEROBIC Blood Culture adequate volume   Culture   Final    NO GROWTH 2 DAYS Performed at [facility], [address] [place], [place] [address]    Report Status PENDING  Incomplete  Culture, blood (Routine X 2) w Reflex to ID Panel     Status: None (Preliminary result)   Collection Time: 03/11/20  3:48 AM   Specimen: BLOOD RIGHT FOREARM  Result Value Ref Range Status   Specimen Description BLOOD RIGHT FOREARM  Final   Special Requests   Final    BOTTLES DRAWN AEROBIC ONLY Blood Culture results may not be optimal due to an inadequate volume of blood received in  culture bottles   Culture   Final    NO GROWTH 1 DAY Performed at [facility], [name] [place], [place] [address]    Report Status PENDING  Incomplete    RADIOLOGY STUDIES/RESULTS: CT HEAD WO CONTRAST  Result Date: 03/11/2020 CLINICAL DATA:  Altered mental status, head trauma EXAM: CT HEAD WITHOUT CONTRAST TECHNIQUE: Contiguous axial images were obtained from the base of the skull through the vertex without intravenous contrast. COMPARISON:  12/20/2019 FINDINGS: Brain: There is atrophy and chronic small vessel disease changes. No acute intracranial abnormality. Specifically, no hemorrhage, hydrocephalus, mass lesion, acute infarction, or significant intracranial injury. Vascular: No hyperdense vessel or unexpected calcification. Skull: No acute calvarial abnormality. Sinuses/Orbits: Visualized paranasal sinuses and mastoids clear. Orbital soft tissues unremarkable. Other: None IMPRESSION: Atrophy, chronic microvascular disease. No acute intracranial abnormality. Electronically Signed   By: Charlett Nose M.D.   On: 03/11/2020 01:41   CT Angio Chest PE W and/or Wo Contrast  Result Date: 03/10/2020 CLINICAL DATA:  Pulmonary embolism EXAM: CT ANGIOGRAPHY CHEST WITH CONTRAST TECHNIQUE: Multidetector CT imaging of the chest was performed using the standard protocol during bolus administration of intravenous contrast. Multiplanar CT image reconstructions and MIPs were obtained to evaluate the vascular anatomy. CONTRAST:  OMNIPAQUE IOHEXOL 350 MG/ML SOLN COMPARISON:  None. FINDINGS: Cardiovascular: There is excellent opacification of the pulmonary arterial tree. There is no intraluminal filling defect to suggest acute pulmonary embolism. The central pulmonary arteries are of normal caliber. Moderate coronary artery calcification. Global cardiac size is within normal limits. Marked left ventricular hypertrophy noted. No pericardial effusion. Moderate atherosclerotic calcification is seen  within the descending thoracic aorta. No aortic aneurysm. Mediastinum/Nodes: No pathologic thoracic adenopathy. Thyroid unremarkable. Small hiatal hernia. Lungs/Pleura: Benign  13 mm left basilar pleural lipoma. Lungs are clear. No pneumothorax or pleural effusion. Central airways are widely patent. Upper Abdomen: Multiple hyperdense lesions are seen within the visualized upper pole of the left kidney, incompletely evaluated on this examination. Moderate stool noted within the upper abdomen. Musculoskeletal: No acute bone abnormality. Review of the MIP images confirms the above findings. IMPRESSION: No pulmonary embolism. Marked left ventricular hypertrophy. Moderate coronary artery calcification. Incompletely evaluated hyperdense lesions within the upper pole of the left kidney. If indicated, these could be better assessed with dedicated renal sonography. Aortic Atherosclerosis (ICD10-I70.0). Electronically Signed   By: Helyn Numbers MD   On: 03/10/2020 22:58   DG Chest Port 1 View  Result Date: 03/10/2020 CLINICAL DATA:  Weakness, fever.  Concern for sepsis. EXAM: PORTABLE CHEST 1 VIEW.  Patient is rotated. COMPARISON:  Chest x-ray 12/20/2019 FINDINGS: The heart size and mediastinal contours are within normal limits. Lucent right lung compared to the left likely due to patient rotation. En facet vessel identified within the lower/mid lung zone. No focal consolidation. No pulmonary edema. No pleural effusion. No pneumothorax. No acute osseous abnormality. IMPRESSION: No acute cardiopulmonary abnormality. Electronically Signed   By: Tish Frederickson M.D.   On: 03/10/2020 18:43   VAS [place] LOWER EXTREMITY VENOUS (DVT) (ONLY [facility] & [facility] 7a-7p)  Result Date: 03/10/2020  Lower Venous DVT Study Indications: Edema.  Risk Factors: None identified. Limitations: Poor ultrasound/tissue interface and patient positioning, patient immobility, patient pain tolerance. Comparison Study: No prior studies. Performing Technologist:  Chanda Busing RVT  Examination Guidelines: A complete evaluation includes B-mode imaging, spectral Doppler, color Doppler, and power Doppler as needed of all accessible portions of each vessel. Bilateral testing is considered an integral part of a complete examination. Limited examinations for reoccurring indications may be performed as noted. The reflux portion of the exam is performed with the patient in reverse Trendelenburg.  +---------+---------------+---------+-----------+----------+-----------------+ RIGHT    CompressibilityPhasicitySpontaneityPropertiesThrombus Aging    +---------+---------------+---------+-----------+----------+-----------------+ CFV      Partial        No       No                   Age Indeterminate +---------+---------------+---------+-----------+----------+-----------------+ SFJ      Full                                                           +---------+---------------+---------+-----------+----------+-----------------+ FV Prox  Partial        No       No                   Age Indeterminate +---------+---------------+---------+-----------+----------+-----------------+ FV Mid   Partial        No       No                   Age Indeterminate +---------+---------------+---------+-----------+----------+-----------------+ FV DistalPartial        No       No                   Age Indeterminate +---------+---------------+---------+-----------+----------+-----------------+ PFV  Not visualized    +---------+---------------+---------+-----------+----------+-----------------+ POP      None           No       No                   Age Indeterminate +---------+---------------+---------+-----------+----------+-----------------+ PTV      Full                                                           +---------+---------------+---------+-----------+----------+-----------------+ PERO      Full                                                           +---------+---------------+---------+-----------+----------+-----------------+ Gastroc  Full                                                           +---------+---------------+---------+-----------+----------+-----------------+ SSV      None                                         Age Indeterminate +---------+---------------+---------+-----------+----------+-----------------+ EIV                     No       No                                     +---------+---------------+---------+-----------+----------+-----------------+ CIV                                                   Not visualized    +---------+---------------+---------+-----------+----------+-----------------+   +----+---------------+---------+-----------+----------+--------------+ LEFTCompressibilityPhasicitySpontaneityPropertiesThrombus Aging +----+---------------+---------+-----------+----------+--------------+ CFV Full           Yes      Yes                                 +----+---------------+---------+-----------+----------+--------------+     Summary: RIGHT: - Findings consistent with acute deep vein thrombosis involving the right common femoral vein, right femoral vein, and right popliteal vein. - Findings consistent with acute superficial vein thrombosis involving the right small saphenous vein. - No cystic structure found in the popliteal fossa. - The external iliac vein appears patent. Unable to insonate common iliac vein due to overlying bowel gas, and patient positioning.  LEFT: - No evidence of common femoral vein obstruction.  *See table(s) above for measurements and observations. Electronically signed by Fabienne Bruns MD on 03/10/2020 at 7:12:36 PM.    Final      LOS: 1 day   Jeoffrey Massed, MD  [facility]  To contact the attending provider between 7A-7P or the covering provider during after hours 7P-7A,  please log into the web site [email or web address] and access using universal [place] password for that web site. If you do not have the password, please call the hospital operator.  03/12/2020, 1:24 PM

## 2020-03-12 NOTE — Evaluation (Signed)
Occupational Therapy Evaluation Patient Details Name: Carolyn Bishop MRN: [id number] DOB: 10-16-1932 Today's Date: 03/12/2020    History of Present Illness 86yo female presenting with AMS, bedsores, and possible UTI, also with multiple falls OOB recently. PE negative, CTH negative for acute changes. PMH HTN, DM, HLD, obesity   Clinical Impression   PTA, pt lives with elderly sisters and has been requiring increased assist for ADLs, becoming bedbound in recent months. Pt presents now with diagnoses above and deficits in cognition, strength, endurance and balance. Pt with tangential conversation throughout, difficulty attending to tasks and inconsistent following of commands. Pt overall Total A for attempts to sit EOB with pt noted to be pushing back. Pt able to demonstrate ability to wash face with cues, but will require increased assist for multi-step ADLs. Pt requires Max A for UB ADLs and Total A for LB ADLs. Pt will currently be a maximove transfer out of bed for staff. Recommend DC to SNF for short term rehab or long term care pending progression.     Follow Up Recommendations  SNF;Supervision/Assistance - 24 hour    Equipment Recommendations  Other (comment) (to be decided)    Recommendations for Other Services       Precautions / Restrictions Precautions Precautions: Fall Restrictions Weight Bearing Restrictions: No      Mobility Bed Mobility Overal bed mobility: Needs Assistance Bed Mobility: Rolling;Supine to Sit Rolling: Max assist   Supine to sit: Total assist     General bed mobility comments: Total A for attempts to sit EOB. Pt attempting to follow commands with tactile cues but ultimately laid back horizontally in bed, unsafe to progress fully EOB with only one person at this time    Transfers                 General transfer comment: deferred- safety    Balance Overall balance assessment: History of Falls                                          ADL either performed or assessed with clinical judgement   ADL Overall ADL's : Needs assistance/impaired Eating/Feeding: Minimal assistance;Bed level   Grooming: Minimal assistance;Bed level Grooming Details (indicate cue type and reason): Supervision for washing face, cues to attend to task. Will require increased assist for multi step grooming tasks Upper Body Bathing: Moderate assistance;Bed level   Lower Body Bathing: Total assistance;Bed level   Upper Body Dressing : Moderate assistance;Bed level Upper Body Dressing Details (indicate cue type and reason): Mod A for donning gown. pt had removed prior to OT entry Lower Body Dressing: Total assistance;Bed level       Toileting- Clothing Manipulation and Hygiene: Total assistance;Bed level         General ADL Comments: Limited greatly by cognition and inconsistent ability to follow commands and attend to tasks     Vision Baseline Vision/History: Wears glasses Patient Visual Report: No change from baseline Vision Assessment?: Vision impaired- to be further tested in functional context Additional Comments: Difficulty locating and reaching for bedrail, pt reports difficulty with vision but unable to elaborate further than wearing glasses     Perception     Praxis      Pertinent Vitals/Pain Pain Assessment: Faces Faces Pain Scale: Hurts a little bit Pain Location: headache Pain Descriptors / Indicators: Discomfort Pain Intervention(s): Limited activity within patient's tolerance;Monitored during session  Hand Dominance Right   Extremity/Trunk Assessment Upper Extremity Assessment Upper Extremity Assessment: Generalized weakness   Lower Extremity Assessment Lower Extremity Assessment: Defer to PT evaluation   Cervical / Trunk Assessment Cervical / Trunk Assessment: Kyphotic   Communication Communication Communication: Other (comment) (dementia, difficulty expressing thoughts)   Cognition  Arousal/Alertness: Awake/alert Behavior During Therapy: Restless;Flat affect Overall Cognitive Status: History of cognitive impairments - at baseline Area of Impairment: Orientation;Attention;Memory;Following commands;Safety/judgement;Awareness;Problem solving                 Orientation Level: Time;Situation Current Attention Level: Sustained Memory: Decreased recall of precautions;Decreased short-term memory Following Commands: Follows one step commands inconsistently;Follows multi-step commands inconsistently Safety/Judgement: Decreased awareness of safety;Decreased awareness of deficits Awareness: Intellectual Problem Solving: Slow processing;Decreased initiation;Difficulty sequencing;Requires verbal cues;Requires tactile cues General Comments: pleasantly confused, very easily distracted and requires multiple cues/hand over hand assist for cue following even then very inconsistent   General Comments  NT in assessing vitals WNL on RA.    Exercises     Shoulder Instructions      Home Living Family/patient expects to be discharged to:: Skilled nursing facility                                 Additional Comments: Per chart, pt lived previously with 2 elderly sisters with niece who assisted intermittently, but also works. Recently, pt had become difficulty for family to care for, bedbound and development of decreased skin integrity      Prior Functioning/Environment Level of Independence: Needs assistance  Gait / Transfers Assistance Needed: Bedbound since August per chart ADL's / Homemaking Assistance Needed: bedbound since august per chart, requiring extensive assist for ADLs   Comments: Multiple falls OOB recently, per chart. Was walking at SNF but once discharged, became bedbound        OT Problem List: Decreased strength;Decreased activity tolerance;Impaired balance (sitting and/or standing);Impaired vision/perception;Decreased coordination;Decreased  cognition;Decreased safety awareness;Decreased knowledge of use of DME or AE      OT Treatment/Interventions: Self-care/ADL training;Therapeutic exercise;DME and/or AE instruction;Therapeutic activities;Patient/family education;Balance training    OT Goals(Current goals can be found in the care plan section) Acute Rehab OT Goals Patient Stated Goal: go to church OT Goal Formulation: Patient unable to participate in goal setting Time For Goal Achievement: 03/26/20 Potential to Achieve Goals: Fair ADL Goals Pt Will Perform Eating: with modified independence;sitting Pt Will Perform Grooming: with modified independence;sitting Pt Will Perform Upper Body Dressing: with min guard assist;sitting Additional ADL Goal #1: Pt to demonstrate ability to sit EOB for 3-5 min during ADLs with no more than Min A to maintain balance Additional ADL Goal #2: Pt to demonstrate ability to follow one-step commands with 75% accuracy  OT Frequency: Min 2X/week   Barriers to D/C:            Co-evaluation              AM-PAC OT "6 Clicks" Daily Activity     Outcome Measure Help from another person eating meals?: A Little Help from another person taking care of personal grooming?: A Little Help from another person toileting, which includes using toliet, bedpan, or urinal?: Total Help from another person bathing (including washing, rinsing, drying)?: Total Help from another person to put on and taking off regular upper body clothing?: A Lot Help from another person to put on and taking off regular lower body clothing?: Total 6 Click Score: 11  End of Session Nurse Communication: Mobility status  Activity Tolerance: Other (comment) (limited by cognition) Patient left: in bed;with call bell/phone within reach;with bed alarm set  OT Visit Diagnosis: Unsteadiness on feet (R26.81);Other abnormalities of gait and mobility (R26.89);Muscle weakness (generalized) (M62.81);Repeated falls (R29.6);History of  falling (Z91.81);Other symptoms and signs involving cognitive function                Time: 8270-7867 OT Time Calculation (min): 19 min Charges:  OT General Charges $OT Visit: 1 Visit OT Evaluation $OT Eval Moderate Complexity: 1 Mod  Lorre Munroe, OTR/L  Lorre Munroe 03/12/2020, 8:53 AM

## 2020-03-12 NOTE — Evaluation (Signed)
Clinical/Bedside Swallow Evaluation Patient Details  Name: Carolyn Bishop MRN: [id number] Date of Birth: 09-15-1932  Today's Date: 03/12/2020 Time: SLP Start Time (ACUTE ONLY): 1025 SLP Stop Time (ACUTE ONLY): 1042 SLP Time Calculation (min) (ACUTE ONLY): 17 min  Past Medical History:  Past Medical History:  Diagnosis Date  . Abnormal gait    Onset 02/05/2015  . Arthritis   . Benign essential hypertension   . Diabetes (HCC)   . Diabetes mellitus type II, controlled, with no complications (HCC)   . HLD (hyperlipidemia)   . HTN (hypertension)   . Obesity   . Palpitations   . Vitamin D deficiency    Past Surgical History:  Past Surgical History:  Procedure Laterality Date  . ABDOMINAL HYSTERECTOMY    . BIOPSY  10/29/2017   Procedure: BIOPSY;  Surgeon: Kerin Salen, MD;  Location: [facility];  Service: Gastroenterology;;  . ESOPHAGOGASTRODUODENOSCOPY (EGD) WITH PROPOFOL N/A 10/29/2017   Procedure: ESOPHAGOGASTRODUODENOSCOPY (EGD) WITH PROPOFOL;  Surgeon: Kerin Salen, MD;  Location: [facility];  Service: Gastroenterology;  Laterality: N/A;   HPI:  Pt is an 86 y.o. female with medical history significant of arthritis, HTN, frequent falls, diabetes, HLD, vitamin D deficiency, dementia, failure to thrive who was presented with bedsores and possible UTI. CT head and CXR were negative for acute changes. Pt diagnosed with acute encephalopathy.    Assessment / Plan / Recommendation Clinical Impression  Pt was seen for bedside swallow evaluation. She exhibited difficulty providing meaningful history and inconsistently followed commands. Oral mechanism exam was limited but strength and ROM appeared grossly WFL and dentition was adequate. She tolerated all solids and liquids without signs or symptoms of oropharyngeal dysphagia and RN denied observance of any difficulty. A regular texture diet with thin liquids is recommended at this time and further skilled SLP services are not clinically  indicated for swallowing.  SLP Visit Diagnosis: Dysphagia, unspecified (R13.10)    Aspiration Risk  Mild aspiration risk    Diet Recommendation Regular;Thin liquid   Liquid Administration via: Cup;Straw Medication Administration: Whole meds with puree Supervision: Staff to assist with self feeding Compensations: Slow rate;Minimize environmental distractions Postural Changes: Seated upright at 90 degrees    Other  Recommendations Oral Care Recommendations: Oral care BID   Follow up Recommendations None      Frequency and Duration            Prognosis Prognosis for Safe Diet Advancement: Good Barriers to Reach Goals: Cognitive deficits      Swallow Study   General Date of Onset: 03/11/20 HPI: Pt is an 86 y.o. female with medical history significant of arthritis, HTN, frequent falls, diabetes, HLD, vitamin D deficiency, dementia, failure to thrive who was presented with bedsores and possible UTI. CT head and CXR were negative for acute changes. Pt diagnosed with acute encephalopathy.  Type of Study: Bedside Swallow Evaluation Previous Swallow Assessment: None Diet Prior to this Study: Regular;Thin liquids Temperature Spikes Noted: No Respiratory Status: Room air History of Recent Intubation: No Behavior/Cognition: Alert;Cooperative;Pleasant mood Oral Cavity Assessment: Within Functional Limits Oral Care Completed by SLP: No Oral Cavity - Dentition: Adequate natural dentition Vision: Functional for self-feeding Self-Feeding Abilities: Able to feed self Patient Positioning: Upright in bed Baseline Vocal Quality: Normal Volitional Cough: Strong Volitional Swallow: Able to elicit    Oral/Motor/Sensory Function Overall Oral Motor/Sensory Function: Within functional limits   Ice Chips Ice chips: Not tested   Thin Liquid Thin Liquid: Within functional limits Presentation: Straw    Nectar Thick  Nectar Thick Liquid: Not tested   Honey Thick Honey Thick Liquid: Not tested    Puree Puree: Within functional limits Presentation: Spoon   Solid     Solid: Within functional limits Presentation: Self Fed (regular, mixed     Nicodemus Denk I. Vear Clock, MS, CCC-SLP Acute Rehabilitation Services Office number [phone] Pager [phone]  Scheryl Marten 03/12/2020,10:51 AM

## 2020-03-12 NOTE — Progress Notes (Signed)
Initial Nutrition Assessment  DOCUMENTATION CODES:   Non-severe (moderate) malnutrition in context of chronic illness  INTERVENTION:   Ensure Enlive po BID, each supplement provides 350 kcal and 20 grams of protein  Magic cup TID with meals, each supplement provides 290 kcal and 9 grams of protein  MVI with minerals    NUTRITION DIAGNOSIS:   Moderate Malnutrition related to chronic illness (dementia, HTN, CHF, DM) as evidenced by moderate fat depletion, moderate muscle depletion.  GOAL:   Patient will meet greater than or equal to 90% of their needs  MONITOR:   PO intake, Supplement acceptance  REASON FOR ASSESSMENT:   Consult Assessment of nutrition requirement/status  ASSESSMENT:   Pt with PMH of dementia, DM, CHF, and HTN who lives at home with elderly sisters and a care taker who was admitted with R leg DVT and UTI.  Noted vitamin B12 deficiency being supplemented Noted PT/OT recommends SNF at discharge.  Per RN pt must be fed and only ate bites at lunch today.   Pt unable to answer any questions at this time.   Medications reviewed and include: vitamin B12, colace, SSI, thiamine Labs reviewed:  CBG's: [phone]    NUTRITION - FOCUSED PHYSICAL EXAM:    Most Recent Value  Orbital Region Mild depletion  Upper Arm Region Mild depletion  Thoracic and Lumbar Region No depletion  Buccal Region No depletion  Temple Region Moderate depletion  Clavicle Bone Region Moderate depletion  Clavicle and Acromion Bone Region Moderate depletion  Scapular Bone Region Moderate depletion  Dorsal Hand Severe depletion  Patellar Region Unable to assess  Anterior Thigh Region Severe depletion  Posterior Calf Region Severe depletion  Edema (RD Assessment) None  Hair Reviewed  Eyes Reviewed  Mouth Reviewed  Skin Reviewed  Nails Reviewed       Diet Order:   Diet Order            DIET DYS 3 Room service appropriate? Yes; Fluid consistency: Thin  Diet effective now                  EDUCATION NEEDS:   No education needs have been identified at this time  Skin:  Skin Assessment: Skin Integrity Issues: Skin Integrity Issues:: Stage II Stage II: sacrum  Last BM:  unknown  Height:   Ht Readings from Last 1 Encounters:  03/10/20 5\' 3"  (1.6 m)    Weight:   Wt Readings from Last 1 Encounters:  03/10/20 59 kg    Ideal Body Weight:  52.2 kg  BMI:  Body mass index is 23.03 kg/m.  Estimated Nutritional Needs:   Kcal:  1600-1800  Protein:  75-90 grams  Fluid:  > 1.6 L/day  13/01/21., RD, LDN, CNSC See AMiON for contact information

## 2020-03-13 LAB — GLUCOSE, CAPILLARY
Glucose-Capillary: 109 mg/dL — ABNORMAL HIGH (ref 70–99)
Glucose-Capillary: 171 mg/dL — ABNORMAL HIGH (ref 70–99)
Glucose-Capillary: 203 mg/dL — ABNORMAL HIGH (ref 70–99)
Glucose-Capillary: 210 mg/dL — ABNORMAL HIGH (ref 70–99)
Glucose-Capillary: 220 mg/dL — ABNORMAL HIGH (ref 70–99)
Glucose-Capillary: 54 mg/dL — ABNORMAL LOW (ref 70–99)
Glucose-Capillary: 89 mg/dL (ref 70–99)

## 2020-03-13 LAB — URINE CULTURE: Culture: >=100000 — AB

## 2020-03-13 MED ORDER — CEPHALEXIN 250 MG PO CAPS
250.0000 mg | ORAL_CAPSULE | Freq: Three times a day (TID) | ORAL | Status: DC
  Administered 2020-03-13 – 2020-03-17 (×13): 250 mg via ORAL
  Filled 2020-03-13 (×15): qty 1

## 2020-03-13 NOTE — TOC Initial Note (Signed)
Transition of Care [facility]) - Initial/Assessment Note    Patient Details  Name: Carolyn Bishop MRN: [id number] Date of Birth: 06-07-1932  Transition of Care [facility] CM/SW Contact:    Carolyn Bridgeman, RN Phone Number: 03/13/2020, 12:46 PM  Clinical Narrative:                 Case management spoke with the patient's niece this morning regarding Medicare choice for SNF placement after admission for requent falls, UTI and development of bedsores.  The niece states that she is agreeable to [facility] for [facility] placement.  I called [employer] and started insurance authorization for the patient for [facility] - ref # [id number] and clinicals were faxed to [phone].  Carolyn Bishop, CM at [facility] was contacted at [phone] to save abed for the patient for admission.  Patient was vaccinated for COVID with her last admission to [facility] but the family did not receive a copy of the COVID vaccination card.  Will continue to follow for [facility] admission once insurance is approved for placement at [facility].  Expected Discharge Plan: Skilled Nursing Facility Barriers to Discharge: Insurance Authorization   Patient Goals and CMS Choice Patient states their goals for this hospitalization and ongoing recovery are:: Family plans to admit her to SNF facility. CMS Medicare.gov Compare Post Acute Care list provided to:: [name] (must comment) (Neice - Carolyn Bishop) Choice offered to / list presented to : [facility] POA / Guardian  Expected Discharge Plan and Services Expected Discharge Plan: Skilled Nursing Facility   Discharge Planning Services: CM Consult Post Acute Care Choice: Skilled Nursing Facility Living arrangements for the past 2 months: Single Family Home                                      Prior Living Arrangements/Services Living arrangements for the past 2 months: Single Family Home Lives with:: Relatives Patient language and need for interpreter reviewed::  Yes Do you feel safe going back to the place where you live?: Yes      Need for Family Participation in Patient Care: Yes (Comment) Care giver support system in place?: Yes (comment)   Criminal Activity/Legal Involvement Pertinent to Current Situation/Hospitalization: No - Comment as needed  Activities of Daily Living Home Assistive Devices/Equipment: Walker (specify type) ADL Screening (condition at time of admission) Patient's cognitive ability adequate to safely complete daily activities?: No Is the patient deaf or have difficulty hearing?: No Does the patient have difficulty seeing, even when wearing glasses/contacts?: No Does the patient have difficulty concentrating, remembering, or making decisions?: Yes Patient able to express need for assistance with ADLs?: Yes Does the patient have difficulty dressing or bathing?: Yes Independently performs ADLs?: No Communication: Independent Dressing (OT): Needs assistance Is this a change from baseline?: Pre-admission baseline Grooming: Needs assistance Is this a change from baseline?: Pre-admission baseline Feeding: Needs assistance Is this a change from baseline?: Pre-admission baseline Bathing: Needs assistance Is this a change from baseline?: Pre-admission baseline Toileting: Needs assistance Is this a change from baseline?: Pre-admission baseline In/Out Bed: Needs assistance Is this a change from baseline?: Pre-admission baseline Walks in Home: Needs assistance Is this a change from baseline?: Pre-admission baseline Does the patient have difficulty walking or climbing stairs?: Yes Weakness of Legs: Both Weakness of Arms/Hands: Both  Permission Sought/Granted Permission sought to share information with : Case Manager Permission granted to share information with :  Yes, Verbal Permission Granted     Permission granted to share info w AGENCY: SNF facility  Permission granted to share info w Relationship: Neice - Carolyn Bishop      Emotional Assessment Appearance:: Appears stated age Attitude/Demeanor/Rapport: Gracious Affect (typically observed): Accepting Orientation: : Oriented to Self, Oriented to Place Alcohol / Substance Use: Not Applicable Psych Involvement: No (comment)  Admission diagnosis:  UTI (urinary tract infection) [N39.0] Patient Active Problem List   Diagnosis Date Noted  . UTI (urinary tract infection) 03/11/2020  . DM (diabetes mellitus), type 2 (HCC) 03/11/2020  . Debility 03/11/2020  . DVT (deep venous thrombosis) (HCC) 03/11/2020  . Chronic diastolic CHF (congestive heart failure) (HCC) 03/11/2020  . Pressure injury of skin 03/10/2020  . Malnutrition of moderate degree 12/22/2019  . Fall at home 12/20/2019  . Acute encephalopathy 10/28/2019  . Grade I diastolic dysfunction 10/28/2019  . HLD (hyperlipidemia) 10/28/2017  . Acute urinary retention   . AKI (acute kidney injury) (HCC) 10/27/2017  . Hypotension 10/27/2017  . Prediabetes 10/27/2017  . Essential hypertension 10/27/2017  . Hypoglycemia 10/27/2017  . Normocytic anemia 10/27/2017  . GIB (gastrointestinal bleeding) 10/27/2017  . Lung mass 10/27/2017  . Lactic acid acidosis 10/27/2017  . Left arm weakness 10/27/2017   PCP:  Sharon Seller, NP Pharmacy:   [facility] [phone] [place], [place] - [phone] [facility] [address] AT [facility] [address] & [address] [place] [address] Phone: [phone] Fax: [phone]     Social Determinants of Health (SDOH) Interventions    Readmission Risk Interventions Readmission Risk Prevention Plan 03/13/2020  Transportation Screening Complete  PCP or Specialist Appt within 5-7 Days Complete  Home Care Screening Complete  Medication Review (RN CM) Complete  Some recent data might be hidden

## 2020-03-13 NOTE — [place] FL2 (Signed)
[place] MEDICAID FL2 LEVEL OF CARE SCREENING TOOL     IDENTIFICATION  Patient Name: Carolyn Bishop Birthdate: [birth year] Sex: female Admission Date (Current Location): 03/10/2020  [facility] and [place] Number:  Producer, television/film/video and Address:  [facility], [address], [place], [place] [address]      Provider Number: [id number]  Attending Physician Name and Address:  Maretta Bees, MD  Relative Name and Phone Number:       Current Level of Care: Hospital Recommended Level of Care: Skilled Nursing Facility Prior Approval Number:    Date Approved/Denied:   PASRR Number: [id number]  Discharge Plan: SNF    Current Diagnoses: Patient Active Problem List   Diagnosis Date Noted  . UTI (urinary tract infection) 03/11/2020  . DM (diabetes mellitus), type 2 (HCC) 03/11/2020  . Debility 03/11/2020  . DVT (deep venous thrombosis) (HCC) 03/11/2020  . Chronic diastolic CHF (congestive heart failure) (HCC) 03/11/2020  . Pressure injury of skin 03/10/2020  . Malnutrition of moderate degree 12/22/2019  . Fall at home 12/20/2019  . Acute encephalopathy 10/28/2019  . Grade I diastolic dysfunction 10/28/2019  . HLD (hyperlipidemia) 10/28/2017  . Acute urinary retention   . AKI (acute kidney injury) (HCC) 10/27/2017  . Hypotension 10/27/2017  . Prediabetes 10/27/2017  . Essential hypertension 10/27/2017  . Hypoglycemia 10/27/2017  . Normocytic anemia 10/27/2017  . GIB (gastrointestinal bleeding) 10/27/2017  . Lung mass 10/27/2017  . Lactic acid acidosis 10/27/2017  . Left arm weakness 10/27/2017    Orientation RESPIRATION BLADDER Height & Weight     Self, Place  Normal External catheter Weight: 59 kg Height:  5\' 3"  (160 cm)  BEHAVIORAL SYMPTOMS/MOOD NEUROLOGICAL BOWEL NUTRITION STATUS      Continent Diet (See discharge summary)  AMBULATORY STATUS COMMUNICATION OF NEEDS Skin   Total Care Verbally Other (Comment) (blisters on buttocks and  sacrum)                       Personal Care Assistance Level of Assistance  Bathing, Feeding, Dressing Bathing Assistance: Maximum assistance Feeding assistance: Limited assistance Dressing Assistance: Maximum assistance     Functional Limitations Info  Sight, Hearing, Speech Sight Info: Adequate Hearing Info: Adequate Speech Info: Adequate    SPECIAL CARE FACTORS FREQUENCY  PT (By licensed PT), OT (By licensed OT), Speech therapy     PT Frequency: 5 x per week OT Frequency: 5 x per week     Speech Therapy Frequency: 2 x per week      Contractures Contractures Info: Not present    Additional Factors Info  Code Status, Allergies, Psychotropic Code Status Info: DNR Allergies Info: NKDA Psychotropic Info: Namenda         Current Medications (03/13/2020):  This is the current hospital active medication list Current Facility-Administered Medications  Medication Dose Route Frequency Provider Last Rate Last Admin  . acetaminophen (TYLENOL) tablet 650 mg  650 mg Oral Q6H PRN 13/08/2019, MD       Or  . acetaminophen (TYLENOL) suppository 650 mg  650 mg Rectal Q6H PRN Doutova, Anastassia, MD      . acetaminophen (TYLENOL) tablet 650 mg  650 mg Oral Q6H PRN Chotiner, Therisa Doyne, MD   650 mg at 03/13/20 0034  . amLODipine (NORVASC) tablet 10 mg  10 mg Oral Daily 13/04/21, MD   10 mg at 03/13/20 0913  . apixaban (ELIQUIS) tablet 10 mg  10 mg  Oral BID Therisa Doyne, MD   10 mg at 03/13/20 0913   Followed by  . [START ON 03/17/2020] apixaban (ELIQUIS) tablet 5 mg  5 mg Oral BID Doutova, Anastassia, MD      . cefTRIAXone (ROCEPHIN) 1 g in sodium chloride 0.9 % 100 mL IVPB  1 g Intravenous Q24H Doutova, Anastassia, MD 200 mL/hr at 03/13/20 0036 1 g at 03/13/20 0036  . cyanocobalamin ((VITAMIN B-12)) injection 1,000 mcg  1,000 mcg Subcutaneous Daily Maretta Bees, MD   1,000 mcg at 03/13/20 0914  . docusate sodium (COLACE) capsule 100 mg  100 mg Oral  BID Therisa Doyne, MD   100 mg at 03/13/20 0913  . feeding supplement (ENSURE ENLIVE / ENSURE PLUS) liquid 237 mL  237 mL Oral BID BM Maretta Bees, MD   237 mL at 03/13/20 0914  . HYDROcodone-acetaminophen (NORCO/VICODIN) 5-325 MG per tablet 1-2 tablet  1-2 tablet Oral Q4H PRN Therisa Doyne, MD      . MEDLINE mouth rinse  15 mL Mouth Rinse BID Maretta Bees, MD   15 mL at 03/13/20 0914  . memantine (NAMENDA) tablet 10 mg  10 mg Oral BID Maretta Bees, MD   10 mg at 03/13/20 0913  . multivitamin with minerals tablet 1 tablet  1 tablet Oral Daily Maretta Bees, MD   1 tablet at 03/13/20 0913  . ondansetron (ZOFRAN) tablet 4 mg  4 mg Oral Q6H PRN Therisa Doyne, MD       Or  . ondansetron (ZOFRAN) injection 4 mg  4 mg Intravenous Q6H PRN Doutova, Anastassia, MD      . thiamine tablet 100 mg  100 mg Oral Daily Doutova, Anastassia, MD   100 mg at 03/13/20 0277     Discharge Medications: Please see discharge summary for a list of discharge medications.  Relevant Imaging Results:  Relevant Lab Results:   Additional Information ss#[id number]  Janae Bridgeman, RN

## 2020-03-13 NOTE — Progress Notes (Signed)
PROGRESS NOTE        PATIENT DETAILS Name: Carolyn Bishop Age: 86 y.o. Sex: female Date of Birth: Sep 09, 1932 Admit Date: 03/10/2020 Admitting Physician Therisa Doyne, MD VFI:EPPIRJJ, Janene Harvey, NP  Brief Narrative: Patient is a 86 y.o. female with dementia, DM-2, HTN-who lives with sisters and a caretaker-presented with poor oral intake, right leg swelling, and worsening confusion superimposed on dementia.  Found to have right leg DVT and UTI.  See below for further details  Significant events: 11/2>> admit for right leg DVT, UTI with encephalopathy.  Significant studies: 11/1>> bilateral lower extremity Doppler-DVT involving the right common femoral vein, right femoral vein, right popliteal vein. 11/1>> chest x-ray: No acute cardiopulmonary abnormality. 11/2>> CT head: No acute intracranial abnormality. 11/2>> vitamin B12: 210  Antimicrobial therapy: Rocephin: 11/1>> 11/4 Keflex: 11/4>>  Microbiology data: 11/1>> urine culture:> 100,000 colonies/ml of gram neg rods 11/1>> blood culture: No growth 11/2>> blood culture: No growth  Procedures : None  Consults: None  DVT Prophylaxis : apixaban (ELIQUIS) tablet 10 mg  apixaban (ELIQUIS) tablet 5 mg    Subjective: Lying comfortably in bed-no major issues overnight.  Assessment/Plan: Right lower extremity DVT: Suspect provoked due to limited mobility-continue Eliquis.  Complicated UTI: Afebrile-clinically improved-urine culture positive for E. coli-switch to Keflex. Sepsis physiology not present on admission-sepsis ruled out.  Acute metabolic encephalopathy superimposed on dementia: Seems to have improved-unknown baseline but clearly better.  Encephalopathy likely secondary to UTI.  CT head without acute abnormalities.  Chronic diastolic heart failure: Appears euvolemic-follow volume status closely.  Vitamin B12 deficiency: Vitamin B12 levels borderline low-continue supplementation.  HTN:  BP better controlled-continue manidipine.  DM-2 (A1c 6.6 on 11/2): Hypoglycemic earlier today-stop SSI-follow.  Recent Labs    03/13/20 0457 03/13/20 0558 03/13/20 0751  GLUCAP 54* 109* 89   Demenia: Resume Namenda  Moisture associated skin damage in the sacrum: Appreciate wound care evaluation.  Present prior to admission.  Debility/deconditioning: Appreciate PT/OT eval-SNF recommended.  Social worker consulted.   Diet: Diet Order            DIET DYS 3 Room service appropriate? Yes; Fluid consistency: Thin  Diet effective now                  Code Status:  DNR  Family Communication: Spoke with Mellanie (Niece [phone]) over phone-11/3  Disposition Plan: Status is: Inpatient  Remains inpatient appropriate because:Inpatient level of care appropriate due to severity of illness   Dispo: The patient is from: Home              Anticipated d/c is to: SNF              Anticipated d/c date is: 2 days              Patient currently is not medically stable to d/c.   Barriers to Discharge: Awaiting SNF bed.  Antimicrobial agents: Anti-infectives (From admission, onward)   Start     Dose/Rate Route Frequency Ordered Stop   03/13/20 1400  cephALEXin (KEFLEX) capsule 250 mg        250 mg Oral Every 8 hours 03/13/20 1050     03/12/20 0100  cefTRIAXone (ROCEPHIN) 1 g in sodium chloride 0.9 % 100 mL IVPB  Status:  Discontinued        1 g 200 mL/hr over  30 Minutes Intravenous Every 24 hours 03/11/20 0817 03/13/20 1050   03/11/20 0030  cefTRIAXone (ROCEPHIN) 1 g in sodium chloride 0.9 % 100 mL IVPB        1 g 200 mL/hr over 30 Minutes Intravenous  Once 03/11/20 0025 03/11/20 0217       Time spent: 25 minutes-Greater than 50% of this time was spent in counseling, explanation of diagnosis, planning of further management, and coordination of care.  MEDICATIONS: Scheduled Meds: . amLODipine  10 mg Oral Daily  . apixaban  10 mg Oral BID   Followed by  . [START ON  03/17/2020] apixaban  5 mg Oral BID  . cephALEXin  250 mg Oral Q8H  . cyanocobalamin  1,000 mcg Subcutaneous Daily  . docusate sodium  100 mg Oral BID  . feeding supplement  237 mL Oral BID BM  . mouth rinse  15 mL Mouth Rinse BID  . memantine  10 mg Oral BID  . multivitamin with minerals  1 tablet Oral Daily  . thiamine  100 mg Oral Daily   Continuous Infusions:  PRN Meds:.acetaminophen **OR** acetaminophen, acetaminophen, HYDROcodone-acetaminophen, ondansetron **OR** ondansetron (ZOFRAN) IV   PHYSICAL EXAM: Vital signs: Vitals:   03/12/20 0754 03/12/20 1948 03/13/20 0459 03/13/20 0750  BP: (!) 173/82 (!) 141/61 133/65 (!) 163/65  Pulse: 77 72 70 70  Resp: 14 17 16 18   Temp: 97.6 F (36.4 C) 98.3 F (36.8 C) 97.6 F (36.4 C) 98 F (36.7 C)  TempSrc: Oral Oral Oral Oral  SpO2: 98% 100% 100% 100%  Weight:      Height:       Filed Weights   03/10/20 1724  Weight: 59 kg   Body mass index is 23.03 kg/m.   Gen Exam:Alert awake-not in any distress HEENT:atraumatic, normocephalic Chest: B/L clear to auscultation anteriorly CVS:S1S2 regular Abdomen:soft non tender, non distended Extremities:no edema Neurology: Non focal Skin: no rash  I have personally reviewed following labs and imaging studies  LABORATORY DATA: CBC: Recent Labs  Lab 03/10/20 2009 03/11/20 1033 03/12/20 0132  WBC 7.1 6.1 7.7  NEUTROABS 4.8 3.9  --   HGB 10.8* 10.4* 11.3*  HCT 33.2* 32.7* 35.5*  MCV 93.0 94.2 91.5  PLT 242 236 258    Basic Metabolic Panel: Recent Labs  Lab 03/10/20 2009 03/11/20 0348 03/11/20 1033 03/12/20 0132  NA 140  --  139 139  K 3.8  --  3.6 3.5  CL 108  --  105 106  CO2 22  --  25 22  GLUCOSE 135*  --  217* 140*  BUN 25*  --  20 22  CREATININE 0.76  --  0.68 0.69  CALCIUM 9.1  --  8.7* 9.1  MG  --  1.6*  --   --   PHOS  --  3.1  --   --     GFR: Estimated Creatinine Clearance: 42.5 mL/min (by C-G formula based on SCr of 0.69 mg/dL).  Liver Function  Tests: Recent Labs  Lab 03/10/20 2009 03/11/20 1033  AST 17 16  ALT 12 10  ALKPHOS 51 47  BILITOT 0.5 0.3  PROT 6.0* 5.5*  ALBUMIN 2.6* 2.3*   No results for input(s): LIPASE, AMYLASE in the last 168 hours. No results for input(s): AMMONIA in the last 168 hours.  Coagulation Profile: No results for input(s): INR, PROTIME in the last 168 hours.  Cardiac Enzymes: Recent Labs  Lab 03/11/20 0348  CKTOTAL 60  BNP (last 3 results) No results for input(s): PROBNP in the last 8760 hours.  Lipid Profile: No results for input(s): CHOL, HDL, LDLCALC, TRIG, CHOLHDL, LDLDIRECT in the last 72 hours.  Thyroid Function Tests: No results for input(s): TSH, T4TOTAL, FREET4, T3FREE, THYROIDAB in the last 72 hours.  Anemia Panel: Recent Labs    03/11/20 0348  VITAMINB12 210  FOLATE 8.7  FERRITIN 100  TIBC 216*  IRON 56  RETICCTPCT 1.2    Urine analysis:    Component Value Date/Time   COLORURINE AMBER (A) 03/10/2020 1821   APPEARANCEUR TURBID (A) 03/10/2020 1821   LABSPEC 1.033 (H) 03/10/2020 1821   PHURINE 5.0 03/10/2020 1821   GLUCOSEU NEGATIVE 03/10/2020 1821   HGBUR SMALL (A) 03/10/2020 1821   BILIRUBINUR NEGATIVE 03/10/2020 1821   KETONESUR NEGATIVE 03/10/2020 1821   PROTEINUR 30 (A) 03/10/2020 1821   UROBILINOGEN 0.2 02/21/2015 2355   NITRITE POSITIVE (A) 03/10/2020 1821   LEUKOCYTESUR LARGE (A) 03/10/2020 1821    Sepsis Labs: Lactic Acid, Venous    Component Value Date/Time   LATICACIDVEN 1.4 03/10/2020 2009    MICROBIOLOGY: Recent Results (from the past 240 hour(s))  Urine Culture     Status: Abnormal   Collection Time: 03/10/20  6:21 PM   Specimen: Urine, Catheterized  Result Value Ref Range Status   Specimen Description URINE, CATHETERIZED  Final   Special Requests   Final    NONE Performed at [facility], [address], [place], [place] [address]    Culture >=100,000 COLONIES/mL ESCHERICHIA COLI (A)  Final   Report Status 03/13/2020  FINAL  Final   Organism ID, Bacteria ESCHERICHIA COLI (A)  Final      Susceptibility   Escherichia coli - MIC*    AMPICILLIN <=2 SENSITIVE Sensitive     CEFAZOLIN <=4 SENSITIVE Sensitive     CEFEPIME <=0.12 SENSITIVE Sensitive     CEFTRIAXONE <=0.25 SENSITIVE Sensitive     CIPROFLOXACIN <=0.25 SENSITIVE Sensitive     GENTAMICIN <=1 SENSITIVE Sensitive     IMIPENEM <=0.25 SENSITIVE Sensitive     NITROFURANTOIN <=16 SENSITIVE Sensitive     TRIMETH/SULFA <=20 SENSITIVE Sensitive     AMPICILLIN/SULBACTAM <=2 SENSITIVE Sensitive     PIP/TAZO <=4 SENSITIVE Sensitive     * >=100,000 COLONIES/mL ESCHERICHIA COLI  Respiratory Panel by RT PCR (Flu A&B, Covid) - Nasopharyngeal Swab     Status: None   Collection Time: 03/10/20  6:48 PM   Specimen: Nasopharyngeal Swab  Result Value Ref Range Status   SARS Coronavirus 2 by RT PCR NEGATIVE NEGATIVE Final    Comment: (NOTE) SARS-CoV-2 target nucleic acids are NOT DETECTED.  The SARS-CoV-2 RNA is generally detectable in upper respiratoy specimens during the acute phase of infection. The lowest concentration of SARS-CoV-2 viral copies this assay can detect is 131 copies/mL. A negative result does not preclude SARS-Cov-2 infection and should not be used as the sole basis for treatment or other patient management decisions. A negative result may occur with  improper specimen collection/handling, submission of specimen other than nasopharyngeal swab, presence of viral mutation(s) within the areas targeted by this assay, and inadequate number of viral copies (<131 copies/mL). A negative result must be combined with clinical observations, patient history, and epidemiological information. The expected result is Negative.  Fact Sheet for Patients:  https://www.moore.com/  Fact Sheet for Healthcare Providers:  https://www.young.biz/  This test is no t yet approved or cleared by the [place] FDA and  has  been authorized for detection and/or diagnosis of SARS-CoV-2 by FDA under an Emergency Use Authorization (EUA). This EUA will remain  in effect (meaning this test can be used) for the duration of the COVID-19 declaration under Section 564(b)(1) of the Act, 21 U.S.C. section 360bbb-3(b)(1), unless the authorization is terminated or revoked sooner.     Influenza A by PCR NEGATIVE NEGATIVE Final   Influenza B by PCR NEGATIVE NEGATIVE Final    Comment: (NOTE) The Xpert Xpress SARS-CoV-2/FLU/RSV assay is intended as an aid in  the diagnosis of influenza from Nasopharyngeal swab specimens and  should not be used as a sole basis for treatment. Nasal washings and  aspirates are unacceptable for Xpert Xpress SARS-CoV-2/FLU/RSV  testing.  Fact Sheet for Patients: https://www.moore.com/https://www.fda.gov/media/142436/download  Fact Sheet for Healthcare Providers: https://www.young.biz/https://www.fda.gov/media/142435/download  This test is not yet approved or cleared by the [place] FDA and  has been authorized for detection and/or diagnosis of SARS-CoV-2 by  FDA under an Emergency Use Authorization (EUA). This EUA will remain  in effect (meaning this test can be used) for the duration of the  Covid-19 declaration under Section 564(b)(1) of the Act, 21  U.S.C. section 360bbb-3(b)(1), unless the authorization is  terminated or revoked. Performed at [facility], [address] [place], [place] [address]   Culture, blood (Routine X 2) w Reflex to ID Panel     Status: None (Preliminary result)   Collection Time: 03/10/20  8:09 PM   Specimen: BLOOD RIGHT FOREARM  Result Value Ref Range Status   Specimen Description BLOOD RIGHT FOREARM  Final   Special Requests   Final    BOTTLES DRAWN AEROBIC AND ANAEROBIC Blood Culture adequate volume   Culture   Final    NO GROWTH 3 DAYS Performed at [facility], [address] [place], [place] [address]    Report Status PENDING  Incomplete  Culture, blood (Routine X 2) w  Reflex to ID Panel     Status: None (Preliminary result)   Collection Time: 03/11/20  3:48 AM   Specimen: BLOOD RIGHT FOREARM  Result Value Ref Range Status   Specimen Description BLOOD RIGHT FOREARM  Final   Special Requests   Final    BOTTLES DRAWN AEROBIC ONLY Blood Culture results may not be optimal due to an inadequate volume of blood received in culture bottles   Culture   Final    NO GROWTH 2 DAYS Performed at [facility], [address] [place], [place] [address]    Report Status PENDING  Incomplete    RADIOLOGY STUDIES/RESULTS: No results found.   LOS: 2 days   Jeoffrey MassedShanker Ercil Cassis, MD  [facility]    To contact the attending provider between 7A-7P or the covering provider during after hours 7P-7A, please log into the web site [email or web address] and access using universal [place] password for that web site. If you do not have the password, please call the hospital operator.  03/13/2020, 11:51 AM

## 2020-03-14 DIAGNOSIS — I82411 Acute embolism and thrombosis of right femoral vein: Principal | ICD-10-CM

## 2020-03-14 LAB — GLUCOSE, CAPILLARY
Glucose-Capillary: 107 mg/dL — ABNORMAL HIGH (ref 70–99)
Glucose-Capillary: 129 mg/dL — ABNORMAL HIGH (ref 70–99)
Glucose-Capillary: 172 mg/dL — ABNORMAL HIGH (ref 70–99)
Glucose-Capillary: 180 mg/dL — ABNORMAL HIGH (ref 70–99)
Glucose-Capillary: 212 mg/dL — ABNORMAL HIGH (ref 70–99)

## 2020-03-14 LAB — SARS CORONAVIRUS 2 BY RT PCR (HOSPITAL ORDER, PERFORMED IN ~~LOC~~ HOSPITAL LAB): SARS Coronavirus 2: NEGATIVE

## 2020-03-14 MED ORDER — CYANOCOBALAMIN 1000 MCG/ML IJ SOLN: 1000.0000 ug | INTRAMUSCULAR | 0 refills | Status: DC

## 2020-03-14 MED ORDER — ADULT MULTIVITAMIN W/MINERALS CH
1.0000 | ORAL_TABLET | Freq: Every day | ORAL | 0 refills | Status: DC
Start: 2020-03-15 — End: 2020-03-17

## 2020-03-14 MED ORDER — APIXABAN 5 MG PO TABS
5.0000 mg | ORAL_TABLET | Freq: Two times a day (BID) | ORAL | 0 refills | Status: DC
Start: 2020-03-17 — End: 2020-03-17

## 2020-03-14 MED ORDER — APIXABAN 5 MG PO TABS: 10.0000 mg | ORAL_TABLET | Freq: Two times a day (BID) | ORAL | 0 refills | Status: DC

## 2020-03-14 MED ORDER — CEPHALEXIN 250 MG PO CAPS
250.0000 mg | ORAL_CAPSULE | Freq: Three times a day (TID) | ORAL | 0 refills | Status: DC
Start: 2020-03-14 — End: 2020-03-17

## 2020-03-14 MED ORDER — ENSURE ENLIVE PO LIQD: 237.0000 mL | Freq: Two times a day (BID) | ORAL | 0 refills | Status: DC

## 2020-03-14 NOTE — Discharge Summary (Signed)
PATIENT DETAILS Name: Carolyn Bishop Age: 86 y.o. Sex: female Date of Birth: [birth year] MRN: [id number]. Admitting Physician: Therisa Doyne, MD [id number], Janene Harvey, NP  Admit Date: 03/10/2020 Discharge date: 03/14/2020  Recommendations for Outpatient Follow-up:  1. Follow up with PCP in 1-2 weeks 2. Please obtain CMP/CBC in one week 3. Recheck vitamin B12 level in 1 month.  Admitted From:  Home  Disposition: SNF   Home Health: No  Equipment/Devices: None  Discharge Condition: Stable  CODE STATUS:  DNR  Diet recommendation:  Diet Order            Diet - low sodium heart healthy           DIET DYS 3 Room service appropriate? Yes; Fluid consistency: Thin  Diet effective now                 Brief Narrative: Patient is a 86 y.o. female with dementia, DM-2, HTN-who lives with sisters and a caretaker-presented with poor oral intake, right leg swelling, and worsening confusion superimposed on dementia.  Found to have right leg DVT and UTI.  See below for further details  Significant events: 11/2>> admit for right leg DVT, UTI with encephalopathy.  Significant studies: 11/1>> bilateral lower extremity Doppler-DVT involving the right common femoral vein, right femoral vein, right popliteal vein. 11/1>> chest x-ray: No acute cardiopulmonary abnormality. 11/2>> CT head: No acute intracranial abnormality. 11/2>> vitamin B12: 210  Antimicrobial therapy: Rocephin: 11/1>> 11/4 Keflex: 11/4>>  Microbiology data: 11/1>> urine culture: Pansensitive E. coli 11/1>> blood culture: No growth 11/2>> blood culture: No growth  Procedures : None  Consults: None  Brief Hospital Course: Right lower extremity DVT: Suspect provoked due to limited mobility-continue Eliquis-suspect needs at least 3 months of uninterrupted therapy-defer to PCP for further continued care   Complicated UTI: Afebrile-clinically improved-urine culture positive for E. coli-was on IV  Rocephin-has been switched to Keflex-we will plan on a total of 5 days of antimicrobial therapy.Sepsis physiology not present on admission-sepsis ruled out.  Acute metabolic encephalopathy superimposed on dementia: Seems to have improved-unknown baseline but clearly better.  Encephalopathy likely secondary to UTI.  CT head without acute abnormalities.  Chronic diastolic heart failure: Appears euvolemic-follow volume status closely.  Vitamin B12 deficiency: Vitamin B12 levels borderline low-continue supplementation.  HTN: BP better controlled-continue manidipine.  DM-2 (A1c 6.6 on 11/2): Hypoglycemic yesterday-has resolved-does not require any further diabetic therapy.  Follow with PCP for further continued care.  Demenia:  Continue Namenda  Moisture associated skin damage in the sacrum: Appreciate wound care evaluation.  Present prior to admission.  Debility/deconditioning: Appreciate PT/OT eval-SNF recommended.  Nutrition Problem: Nutrition Problem: Moderate Malnutrition Etiology: chronic illness (dementia, HTN, CHF, DM) Signs/Symptoms: moderate fat depletion, moderate muscle depletion Interventions: Ensure Enlive (each supplement provides 350kcal and 20 grams of protein)  RN pressure injury documentation: Pressure Injury 03/10/20 Sacrum Medial Stage 2 -  Partial thickness loss of dermis presenting as a shallow open injury with a red, pink wound bed without slough. (Active)  03/10/20 1734  Location: Sacrum  Location Orientation: Medial  Staging: Stage 2 -  Partial thickness loss of dermis presenting as a shallow open injury with a red, pink wound bed without slough.  Wound Description (Comments):   Present on Admission: Yes      Discharge Diagnoses:  Active Problems:   Essential hypertension   Normocytic anemia   HLD (hyperlipidemia)   Acute encephalopathy   Malnutrition of moderate degree   Pressure injury of skin  UTI (urinary tract infection)   DM (diabetes  mellitus), type 2 (HCC)   Debility   DVT (deep venous thrombosis) (HCC)   Chronic diastolic CHF (congestive heart failure) (HCC)   Discharge Instructions:  Activity:  As tolerated with Full fall precautions use walker/cane & assistance as needed  Discharge Instructions    Call MD for:  difficulty breathing, headache or visual disturbances   Complete by: As directed    Call MD for:  severe uncontrolled pain   Complete by: As directed    Call MD for:  temperature >100.4   Complete by: As directed    Diet - low sodium heart healthy   Complete by: As directed    Discharge instructions   Complete by: As directed    Follow with Primary MD  Sharon Seller, NP in 1-2 weeks  Please get a complete blood count and chemistry panel checked by your Primary MD at your next visit, and again as instructed by your Primary MD.  Get Medicines reviewed and adjusted: Please take all your medications with you for your next visit with your Primary MD  Laboratory/radiological data: Please request your Primary MD to go over all hospital tests and procedure/radiological results at the follow up, please ask your Primary MD to get all Hospital records sent to his/her office.  In some cases, they will be blood work, cultures and biopsy results pending at the time of your discharge. Please request that your primary care M.D. follows up on these results.  Also Note the following: If you experience worsening of your admission symptoms, develop shortness of breath, life threatening emergency, suicidal or homicidal thoughts you must seek medical attention immediately by calling 911 or calling your MD immediately  if symptoms less severe.  You must read complete instructions/literature along with all the possible adverse reactions/side effects for all the Medicines you take and that have been prescribed to you. Take any new Medicines after you have completely understood and accpet all the possible adverse  reactions/side effects.   Do not drive when taking Pain medications or sleeping medications (Benzodaizepines)  Do not take more than prescribed Pain, Sleep and Anxiety Medications. It is not advisable to combine anxiety,sleep and pain medications without talking with your primary care practitioner  Special Instructions: If you have smoked or chewed Tobacco  in the last 2 yrs please stop smoking, stop any regular Alcohol  and or any Recreational drug use.  Wear Seat belts while driving.  Please note: You were cared for by a hospitalist during your hospital stay. Once you are discharged, your primary care physician will handle any further medical issues. Please note that NO REFILLS for any discharge medications will be authorized once you are discharged, as it is imperative that you return to your primary care physician (or establish a relationship with a primary care physician if you do not have one) for your post hospital discharge needs so that they can reassess your need for medications and monitor your lab values.   Discharge wound care:   Complete by: As directed    Continue foam dressing to sacrum. Peel down foam dressing EACH shift.  Record your observations.  Change foam dressing every 3 days or PRN soiling.   Increase activity slowly   Complete by: As directed      Allergies as of 03/14/2020   No Known Allergies     Medication List    TAKE these medications   acetaminophen 500 MG tablet Commonly  known as: TYLENOL Take 1,000 mg by mouth every 6 (six) hours as needed for mild pain or headache.   amLODipine 5 MG tablet Commonly known as: NORVASC Take 1 tablet (5 mg total) by mouth daily.   apixaban 5 MG Tabs tablet Commonly known as: ELIQUIS Take 2 tablets (10 mg total) by mouth 2 (two) times daily for 6 doses.   apixaban 5 MG Tabs tablet Commonly known as: ELIQUIS Take 1 tablet (5 mg total) by mouth 2 (two) times daily. First dose on Mon 03/17/20 at 2200 Start taking on:  March 17, 2020   cephALEXin 250 MG capsule Commonly known as: KEFLEX Take 1 capsule (250 mg total) by mouth every 8 (eight) hours for 1 day.   cyanocobalamin 1000 MCG/ML injection Commonly known as: (VITAMIN B-12) Inject 1 mL (1,000 mcg total) into the skin every 30 (thirty) days.   feeding supplement Liqd Take 237 mLs by mouth 2 (two) times daily between meals.   memantine 10 MG tablet Commonly known as: NAMENDA Take 10 mg by mouth 2 (two) times daily.   multivitamin with minerals Tabs tablet Take 1 tablet by mouth daily. Start taking on: March 15, 2020   vitamin A & D ointment Apply 1 application topically as needed for dry skin.            Discharge Care Instructions  (From admission, onward)         Start     Ordered   03/14/20 0000  Discharge wound care:       Comments: Continue foam dressing to sacrum. Peel down foam dressing EACH shift.  Record your observations.  Change foam dressing every 3 days or PRN soiling.   03/14/20 0914          Follow-up Information    Sharon Seller, NP. Schedule an appointment as soon as possible for a visit in 1 week(s).   Specialty: Geriatric Medicine Contact information: [address] [place] [address] [phone]              No Known Allergies    Other Procedures/Studies: CT HEAD WO CONTRAST  Result Date: 03/11/2020 CLINICAL DATA:  Altered mental status, head trauma EXAM: CT HEAD WITHOUT CONTRAST TECHNIQUE: Contiguous axial images were obtained from the base of the skull through the vertex without intravenous contrast. COMPARISON:  12/20/2019 FINDINGS: Brain: There is atrophy and chronic small vessel disease changes. No acute intracranial abnormality. Specifically, no hemorrhage, hydrocephalus, mass lesion, acute infarction, or significant intracranial injury. Vascular: No hyperdense vessel or unexpected calcification. Skull: No acute calvarial abnormality. Sinuses/Orbits: Visualized paranasal  sinuses and mastoids clear. Orbital soft tissues unremarkable. Other: None IMPRESSION: Atrophy, chronic microvascular disease. No acute intracranial abnormality. Electronically Signed   By: Charlett Nose M.D.   On: 03/11/2020 01:41   CT Angio Chest PE W and/or Wo Contrast  Result Date: 03/10/2020 CLINICAL DATA:  Pulmonary embolism EXAM: CT ANGIOGRAPHY CHEST WITH CONTRAST TECHNIQUE: Multidetector CT imaging of the chest was performed using the standard protocol during bolus administration of intravenous contrast. Multiplanar CT image reconstructions and MIPs were obtained to evaluate the vascular anatomy. CONTRAST:  OMNIPAQUE IOHEXOL 350 MG/ML SOLN COMPARISON:  None. FINDINGS: Cardiovascular: There is excellent opacification of the pulmonary arterial tree. There is no intraluminal filling defect to suggest acute pulmonary embolism. The central pulmonary arteries are of normal caliber. Moderate coronary artery calcification. Global cardiac size is within normal limits. Marked left ventricular hypertrophy noted. No pericardial effusion. Moderate atherosclerotic  calcification is seen within the descending thoracic aorta. No aortic aneurysm. Mediastinum/Nodes: No pathologic thoracic adenopathy. Thyroid unremarkable. Small hiatal hernia. Lungs/Pleura: Benign 13 mm left basilar pleural lipoma. Lungs are clear. No pneumothorax or pleural effusion. Central airways are widely patent. Upper Abdomen: Multiple hyperdense lesions are seen within the visualized upper pole of the left kidney, incompletely evaluated on this examination. Moderate stool noted within the upper abdomen. Musculoskeletal: No acute bone abnormality. Review of the MIP images confirms the above findings. IMPRESSION: No pulmonary embolism. Marked left ventricular hypertrophy. Moderate coronary artery calcification. Incompletely evaluated hyperdense lesions within the upper pole of the left kidney. If indicated, these could be better assessed with  dedicated renal sonography. Aortic Atherosclerosis (ICD10-I70.0). Electronically Signed   By: Helyn NumbersAshesh  Parikh MD   On: 03/10/2020 22:58   DG Chest Port 1 View  Result Date: 03/10/2020 CLINICAL DATA:  Weakness, fever.  Concern for sepsis. EXAM: PORTABLE CHEST 1 VIEW.  Patient is rotated. COMPARISON:  Chest x-ray 12/20/2019 FINDINGS: The heart size and mediastinal contours are within normal limits. Lucent right lung compared to the left likely due to patient rotation. En facet vessel identified within the lower/mid lung zone. No focal consolidation. No pulmonary edema. No pleural effusion. No pneumothorax. No acute osseous abnormality. IMPRESSION: No acute cardiopulmonary abnormality. Electronically Signed   By: Tish FredericksonMorgane  Naveau M.D.   On: 03/10/2020 18:43   VAS US LOWER EXTREMITY VENOUS (DVT) (ONLY [facility] & [facility] 7a-7p)  Result Date: 03/10/2020  Lower Venous DVT Study Indications: Edema.  Risk Factors: None identified. Limitations: Poor ultrasound/tissue interface and patient positioning, patient immobility, patient pain tolerance. Comparison Study: No prior studies. Performing Technologist: Chanda BusingGregory Collins RVT  Examination Guidelines: A complete evaluation includes B-mode imaging, spectral Doppler, color Doppler, and power Doppler as needed of all accessible portions of each vessel. Bilateral testing is considered an integral part of a complete examination. Limited examinations for reoccurring indications may be performed as noted. The reflux portion of the exam is performed with the patient in reverse Trendelenburg.  +---------+---------------+---------+-----------+----------+-----------------+ RIGHT    CompressibilityPhasicitySpontaneityPropertiesThrombus Aging    +---------+---------------+---------+-----------+----------+-----------------+ CFV      Partial        No       No                   Age Indeterminate +---------+---------------+---------+-----------+----------+-----------------+ SFJ       Full                                                           +---------+---------------+---------+-----------+----------+-----------------+ FV Prox  Partial        No       No                   Age Indeterminate +---------+---------------+---------+-----------+----------+-----------------+ FV Mid   Partial        No       No                   Age Indeterminate +---------+---------------+---------+-----------+----------+-----------------+ FV DistalPartial        No       No                   Age Indeterminate +---------+---------------+---------+-----------+----------+-----------------+ PFV  Not visualized    +---------+---------------+---------+-----------+----------+-----------------+ POP      None           No       No                   Age Indeterminate +---------+---------------+---------+-----------+----------+-----------------+ PTV      Full                                                           +---------+---------------+---------+-----------+----------+-----------------+ PERO     Full                                                           +---------+---------------+---------+-----------+----------+-----------------+ Gastroc  Full                                                           +---------+---------------+---------+-----------+----------+-----------------+ SSV      None                                         Age Indeterminate +---------+---------------+---------+-----------+----------+-----------------+ EIV                     No       No                                     +---------+---------------+---------+-----------+----------+-----------------+ CIV                                                   Not visualized    +---------+---------------+---------+-----------+----------+-----------------+    +----+---------------+---------+-----------+----------+--------------+ LEFTCompressibilityPhasicitySpontaneityPropertiesThrombus Aging +----+---------------+---------+-----------+----------+--------------+ CFV Full           Yes      Yes                                 +----+---------------+---------+-----------+----------+--------------+     Summary: RIGHT: - Findings consistent with acute deep vein thrombosis involving the right common femoral vein, right femoral vein, and right popliteal vein. - Findings consistent with acute superficial vein thrombosis involving the right small saphenous vein. - No cystic structure found in the popliteal fossa. - The external iliac vein appears patent. Unable to insonate common iliac vein due to overlying bowel gas, and patient positioning.  LEFT: - No evidence of common femoral vein obstruction.  *See table(s) above for measurements and observations. Electronically signed by Fabienne Bruns MD on 03/10/2020 at 7:12:36 PM.    Final      TODAY-DAY OF DISCHARGE:  Subjective:   Carolyn Bishop today has no headache,no chest  abdominal pain,no new weakness tingling or numbness, feels much better wants to go home today.   Objective:   Blood pressure (!) 126/50, pulse 71, temperature 98.7 F (37.1 C), temperature source Oral, resp. rate 18, height  (1.6 m), weight 59 kg, SpO2 100 %.  Intake/Output Summary (Last 24 hours) at 03/14/2020 0914 Last data filed at 03/14/2020 0834 Gross per 24 hour  Intake 1160 ml  Output 900 ml  Net 260 ml   Filed Weights   03/10/20 1724  Weight: 59 kg    Exam: Awake Alert, Oriented *3, No new F.N deficits, Normal affect .AT,PERRAL Supple Neck,No JVD, No cervical lymphadenopathy appriciated.  Symmetrical Chest wall movement, Good air movement bilaterally, CTAB RRR,No Gallops,Rubs or new Murmurs, No Parasternal Heave +ve B.Sounds, Abd Soft, Non tender, No organomegaly appriciated, No rebound -guarding or  rigidity. No Cyanosis, Clubbing or edema, No new Rash or bruise   PERTINENT RADIOLOGIC STUDIES: No results found.   PERTINENT LAB RESULTS: CBC: Recent Labs    03/11/20 1033 03/12/20 0132  WBC 6.1 7.7  HGB 10.4* 11.3*  HCT 32.7* 35.5*  PLT 236 258   CMET CMP     Component Value Date/Time   NA 139 03/12/2020 0132   NA 142 10/28/2019 0000   K 3.5 03/12/2020 0132   CL 106 03/12/2020 0132   CO2 22 03/12/2020 0132   GLUCOSE 140 (H) 03/12/2020 0132   BUN 22 03/12/2020 0132   BUN 31 (A) 10/28/2019 0000   CREATININE 0.69 03/12/2020 0132   CREATININE 0.87 02/04/2020 1427   CALCIUM 9.1 03/12/2020 0132   PROT 5.5 (L) 03/11/2020 1033   ALBUMIN 2.3 (L) 03/11/2020 1033   AST 16 03/11/2020 1033   ALT 10 03/11/2020 1033   ALKPHOS 47 03/11/2020 1033   BILITOT 0.3 03/11/2020 1033   GFRNONAA >60 03/12/2020 0132   GFRNONAA 61 02/04/2020 1427   GFRAA 70 02/04/2020 1427    GFR Estimated Creatinine Clearance: 42.5 mL/min (by C-G formula based on SCr of 0.69 mg/dL). No results for input(s): LIPASE, AMYLASE in the last 72 hours. No results for input(s): CKTOTAL, CKMB, CKMBINDEX, TROPONINI in the last 72 hours. Invalid input(s): POCBNP No results for input(s): DDIMER in the last 72 hours. No results for input(s): HGBA1C in the last 72 hours. No results for input(s): CHOL, HDL, LDLCALC, TRIG, CHOLHDL, LDLDIRECT in the last 72 hours. No results for input(s): TSH, T4TOTAL, T3FREE, THYROIDAB in the last 72 hours.  Invalid input(s): FREET3 No results for input(s): VITAMINB12, FOLATE, FERRITIN, TIBC, IRON, RETICCTPCT in the last 72 hours. Coags: No results for input(s): INR in the last 72 hours.  Invalid input(s): PT Microbiology: Recent Results (from the past 240 hour(s))  Urine Culture     Status: Abnormal   Collection Time: 03/10/20  6:21 PM   Specimen: Urine, Catheterized  Result Value Ref Range Status   Specimen Description URINE, CATHETERIZED  Final   Special Requests   Final     NONE Performed at [facility], [address] [place], [place] [address]    Culture >=100,000 COLONIES/mL ESCHERICHIA COLI (A)  Final   Report Status 03/13/2020 FINAL  Final   Organism ID, Bacteria ESCHERICHIA COLI (A)  Final      Susceptibility   Escherichia coli - MIC*    AMPICILLIN <=2 SENSITIVE Sensitive     CEFAZOLIN <=4 SENSITIVE Sensitive     CEFEPIME <=0.12 SENSITIVE Sensitive     CEFTRIAXONE <=0.25 SENSITIVE Sensitive  CIPROFLOXACIN <=0.25 SENSITIVE Sensitive     GENTAMICIN <=1 SENSITIVE Sensitive     IMIPENEM <=0.25 SENSITIVE Sensitive     NITROFURANTOIN <=16 SENSITIVE Sensitive     TRIMETH/SULFA <=20 SENSITIVE Sensitive     AMPICILLIN/SULBACTAM <=2 SENSITIVE Sensitive     PIP/TAZO <=4 SENSITIVE Sensitive     * >=100,000 COLONIES/mL ESCHERICHIA COLI  Respiratory Panel by RT PCR (Flu A&B, Covid) - Nasopharyngeal Swab     Status: None   Collection Time: 03/10/20  6:48 PM   Specimen: Nasopharyngeal Swab  Result Value Ref Range Status   SARS Coronavirus 2 by RT PCR NEGATIVE NEGATIVE Final    Comment: (NOTE) SARS-CoV-2 target nucleic acids are NOT DETECTED.  The SARS-CoV-2 RNA is generally detectable in upper respiratoy specimens during the acute phase of infection. The lowest concentration of SARS-CoV-2 viral copies this assay can detect is 131 copies/mL. A negative result does not preclude SARS-Cov-2 infection and should not be used as the sole basis for treatment or other patient management decisions. A negative result may occur with  improper specimen collection/handling, submission of specimen other than nasopharyngeal swab, presence of viral mutation(s) within the areas targeted by this assay, and inadequate number of viral copies (<131 copies/mL). A negative result must be combined with clinical observations, patient history, and epidemiological information. The expected result is Negative.  Fact Sheet for Patients:    https://www.moore.com/  Fact Sheet for Healthcare Providers:  https://www.young.biz/  This test is no t yet approved or cleared by the [place] FDA and  has been authorized for detection and/or diagnosis of SARS-CoV-2 by FDA under an Emergency Use Authorization (EUA). This EUA will remain  in effect (meaning this test can be used) for the duration of the COVID-19 declaration under Section 564(b)(1) of the Act, 21 U.S.C. section 360bbb-3(b)(1), unless the authorization is terminated or revoked sooner.     Influenza A by PCR NEGATIVE NEGATIVE Final   Influenza B by PCR NEGATIVE NEGATIVE Final    Comment: (NOTE) The Xpert Xpress SARS-CoV-2/FLU/RSV assay is intended as an aid in  the diagnosis of influenza from Nasopharyngeal swab specimens and  should not be used as a sole basis for treatment. Nasal washings and  aspirates are unacceptable for Xpert Xpress SARS-CoV-2/FLU/RSV  testing.  Fact Sheet for Patients: https://www.moore.com/  Fact Sheet for Healthcare Providers: https://www.young.biz/  This test is not yet approved or cleared by the [place] FDA and  has been authorized for detection and/or diagnosis of SARS-CoV-2 by  FDA under an Emergency Use Authorization (EUA). This EUA will remain  in effect (meaning this test can be used) for the duration of the  Covid-19 declaration under Section 564(b)(1) of the Act, 21  U.S.C. section 360bbb-3(b)(1), unless the authorization is  terminated or revoked. Performed at [facility], [address], [place], [place] [address]   Culture, blood (Routine X 2) w Reflex to ID Panel     Status: None (Preliminary result)   Collection Time: 03/10/20  8:09 PM   Specimen: BLOOD RIGHT FOREARM  Result Value Ref Range Status   Specimen Description BLOOD RIGHT FOREARM  Final   Special Requests   Final    BOTTLES DRAWN AEROBIC AND ANAEROBIC Blood  Culture adequate volume   Culture   Final    NO GROWTH 4 DAYS Performed at [facility], [address] [place], [place] [address]    Report Status PENDING  Incomplete  Culture, blood (Routine X 2) w  Reflex to ID Panel     Status: None (Preliminary result)   Collection Time: 03/11/20  3:48 AM   Specimen: BLOOD RIGHT FOREARM  Result Value Ref Range Status   Specimen Description BLOOD RIGHT FOREARM  Final   Special Requests   Final    BOTTLES DRAWN AEROBIC ONLY Blood Culture results may not be optimal due to an inadequate volume of blood received in culture bottles   Culture   Final    NO GROWTH 3 DAYS Performed at [facility], [address], [place], [place] [address]    Report Status PENDING  Incomplete    FURTHER DISCHARGE INSTRUCTIONS:  Get Medicines reviewed and adjusted: Please take all your medications with you for your next visit with your Primary MD  Laboratory/radiological data: Please request your Primary MD to go over all hospital tests and procedure/radiological results at the follow up, please ask your Primary MD to get all Hospital records sent to his/her office.  In some cases, they will be blood work, cultures and biopsy results pending at the time of your discharge. Please request that your primary care M.D. goes through all the records of your hospital data and follows up on these results.  Also Note the following: If you experience worsening of your admission symptoms, develop shortness of breath, life threatening emergency, suicidal or homicidal thoughts you must seek medical attention immediately by calling 911 or calling your MD immediately  if symptoms less severe.  You must read complete instructions/literature along with all the possible adverse reactions/side effects for all the Medicines you take and that have been prescribed to you. Take any new Medicines after you have completely understood and accpet all the possible adverse reactions/side  effects.   Do not drive when taking Pain medications or sleeping medications (Benzodaizepines)  Do not take more than prescribed Pain, Sleep and Anxiety Medications. It is not advisable to combine anxiety,sleep and pain medications without talking with your primary care practitioner  Special Instructions: If you have smoked or chewed Tobacco  in the last 2 yrs please stop smoking, stop any regular Alcohol  and or any Recreational drug use.  Wear Seat belts while driving.  Please note: You were cared for by a hospitalist during your hospital stay. Once you are discharged, your primary care physician will handle any further medical issues. Please note that NO REFILLS for any discharge medications will be authorized once you are discharged, as it is imperative that you return to your primary care physician (or establish a relationship with a primary care physician if you do not have one) for your post hospital discharge needs so that they can reassess your need for medications and monitor your lab values.  Total Time spent coordinating discharge including counseling, education and face to face time equals 35 minutes.  SignedJeoffrey Massed 03/14/2020 9:14 AM

## 2020-03-14 NOTE — Plan of Care (Signed)

## 2020-03-14 NOTE — TOC Progression Note (Signed)
Transition of Care [facility]) - Progression Note    Patient Details  Name: Debarah Mccumbers MRN: [id number] Date of Birth: Dec 17, 1932  Transition of Care [facility]) CM/SW Contact  Janae Bridgeman, RN Phone Number: 03/14/2020, 3:21 PM  Clinical Narrative:    Case management spoke with [facility] health and they are requesting a peer to peer with Dr. Jerral Ralph and he was given the information for contact with the physician and he completed this.  I spoke with PT and the the new note was placed.  The new physican note, PT note were both faxed to [facility] with the coordinating ref #.   Expected Discharge Plan: Skilled Nursing Facility Barriers to Discharge: Insurance Authorization  Expected Discharge Plan and Services Expected Discharge Plan: Skilled Nursing Facility   Discharge Planning Services: CM Consult Post Acute Care Choice: Skilled Nursing Facility Living arrangements for the past 2 months: Single Family Home Expected Discharge Date: 03/14/20                                     Social Determinants of Health (SDOH) Interventions    Readmission Risk Interventions Readmission Risk Prevention Plan 03/13/2020  Transportation Screening Complete  PCP or Specialist Appt within 5-7 Days Complete  Home Care Screening Complete  Medication Review (RN CM) Complete  Some recent data might be hidden

## 2020-03-14 NOTE — Progress Notes (Signed)
Physical Therapy Treatment Patient Details Name: Carolyn Bishop MRN: [id number] DOB: 1932-12-22 Today's Date: 03/14/2020    History of Present Illness Pt is an 86 y.o. female admitted from home on 03/10/20 with AMS, bed sores and multiple falls OOB recently. Head CT negative for acute changes. Workup for RLE DVT, UTI with encephalopathy. PMH includes HTN, DM, HLD, obesity.   PT Comments    Pt slowly progressing with mobility. Able to tolerate prolonged partial sitting EOB, requiring significant assist secondary to high anxiety and fear of falling; attempted calming techniques with music. Pt remains limited by this, generalized weakness, decreased activity tolerance and cognitive impairments; at high risk for falls. Continue to recommend SNF-level therapies to maximize functional mobility and decrease caregiver burden.   Follow Up Recommendations  SNF;Supervision/Assistance - 24 hour     Equipment Recommendations   (defer to next venue)    Recommendations for Other Services       Precautions / Restrictions Precautions Precautions: Fall;Other (comment) Precaution Comments: Anxious/fearful of falling Restrictions Weight Bearing Restrictions: No    Mobility  Bed Mobility Overal bed mobility: Needs Assistance Bed Mobility: Supine to Sit;Sit to Supine     Supine to sit: Total assist Sit to supine: Total assist   General bed mobility comments: Achieved partial sitting with totalA, pt resisting with strong hip/trunk extension, fearful of falling; unable to achieve fully upright; totalA return to supine and repositioning. Once supine, pt able to engage trunk well for partial long sitting, but inconsistent with this  Transfers                    Ambulation/Gait                 Stairs             Wheelchair Mobility    Modified Rankin (Stroke Patients Only)       Balance Overall balance assessment: Needs assistance;History of Falls Sitting-balance  support: Bilateral upper extremity supported;Feet unsupported Sitting balance-Leahy Scale: Zero Sitting balance - Comments: able to maintain prolonged partial sitting with totalA for trunk support secondary to extensor pushing                                    Cognition Arousal/Alertness: Awake/alert Behavior During Therapy: Anxious Overall Cognitive Status: History of cognitive impairments - at baseline Area of Impairment: Orientation;Attention;Memory;Following commands;Safety/judgement;Awareness;Problem solving                 Orientation Level: Disoriented to;Place;Time;Situation Current Attention Level: Sustained Memory: Decreased recall of precautions;Decreased short-term memory Following Commands: Follows one step commands inconsistently;Follows multi-step commands inconsistently Safety/Judgement: Decreased awareness of safety;Decreased awareness of deficits Awareness: Intellectual Problem Solving: Slow processing;Decreased initiation;Difficulty sequencing;Requires verbal cues;Requires tactile cues General Comments: pleasantly confused; limited by significant anxiety related to fear of falling, difficult to redirect from this despite playing music and other calming techniques      Exercises      General Comments General comments (skin integrity, edema, etc.): Repositioned in supine to encourage neutral hip rotation and heels floating for pressure relief      Pertinent Vitals/Pain Pain Assessment: Faces Faces Pain Scale: Hurts a little bit Pain Location: headache Pain Descriptors / Indicators: Discomfort;Headache Pain Intervention(s): Monitored during session;Limited activity within patient's tolerance    Home Living  Prior Function            PT Goals (current goals can now be found in the care plan section) Progress towards PT goals: Progressing toward goals    Frequency    Min 2X/week      PT Plan Current  plan remains appropriate    Co-evaluation              AM-PAC PT "6 Clicks" Mobility   Outcome Measure  Help needed turning from your back to your side while in a flat bed without using bedrails?: Total Help needed moving from lying on your back to sitting on the side of a flat bed without using bedrails?: Total Help needed moving to and from a bed to a chair (including a wheelchair)?: Total Help needed standing up from a chair using your arms (e.g., wheelchair or bedside chair)?: Total Help needed to walk in hospital room?: Total Help needed climbing 3-5 steps with a railing? : Total 6 Click Score: 6    End of Session   Activity Tolerance: Patient tolerated treatment well;Other (comment) (limited by impaired cognition, fearful of following) Patient left: in bed;with call bell/phone within reach;with bed alarm set Nurse Communication: Mobility status PT Visit Diagnosis: Unsteadiness on feet (R26.81);Muscle weakness (generalized) (M62.81);Difficulty in walking, not elsewhere classified (R26.2)     Time: 1751-0258 PT Time Calculation (min) (ACUTE ONLY): 15 min  Charges:  $Therapeutic Activity: 8-22 mins                     Ina Homes, PT, DPT Acute Rehabilitation Services  Pager [phone] Office [phone]  Malachy Chamber 03/14/2020, 3:06 PM

## 2020-03-14 NOTE — Care Management Important Message (Signed)
Important Message  Patient Details  Name: Carolyn Bishop MRN: [id number] Date of Birth: Jun 02, 1932   Medicare Important Message Given:  Yes     Chimamanda Siegfried P Irving Lubbers 03/14/2020, 1:58 PM

## 2020-03-15 DIAGNOSIS — N390 Urinary tract infection, site not specified: Secondary | ICD-10-CM

## 2020-03-15 DIAGNOSIS — R319 Hematuria, unspecified: Secondary | ICD-10-CM

## 2020-03-15 LAB — CULTURE, BLOOD (ROUTINE X 2)
Culture: NO GROWTH
Special Requests: ADEQUATE

## 2020-03-15 LAB — GLUCOSE, CAPILLARY
Glucose-Capillary: 92 mg/dL (ref 70–99)
Glucose-Capillary: 90 mg/dL (ref 70–99)
Glucose-Capillary: 114 mg/dL — ABNORMAL HIGH (ref 70–99)
Glucose-Capillary: 244 mg/dL — ABNORMAL HIGH (ref 70–99)
Glucose-Capillary: 202 mg/dL — ABNORMAL HIGH (ref 70–99)

## 2020-03-15 NOTE — Progress Notes (Signed)
[facility]                                                                                                                                                                         Patient Demographics  Carolyn Bishop, is a 86 y.o. female  TOI:[id number]  IPJ:[id number]  DOB - [birth year]  Admit date - 03/10/2020  Admitting Physician Therisa Doyne, MD  Outpatient Primary MD for the patient is Janyth Contes, Janene Harvey, NP  LOS - 4   Chief Complaint  Patient presents with  . Fever        Assessment & Plan    Patient seen briefly today due for discharge soon per Discharge done on 03/14/20 by Dr Jerral Ralph, no further issues, Vital signs stable, patient feels fine. Discharge to SNF once bed available.    Medications  Scheduled Meds: . amLODipine  10 mg Oral Daily  . apixaban  10 mg Oral BID   Followed by  . [START ON 03/17/2020] apixaban  5 mg Oral BID  . cephALEXin  250 mg Oral Q8H  . cyanocobalamin  1,000 mcg Subcutaneous Daily  . docusate sodium  100 mg Oral BID  . feeding supplement  237 mL Oral BID BM  . mouth rinse  15 mL Mouth Rinse BID  . memantine  10 mg Oral BID  . multivitamin with minerals  1 tablet Oral Daily  . thiamine  100 mg Oral Daily   Continuous Infusions: PRN Meds:.acetaminophen **OR** acetaminophen, acetaminophen, HYDROcodone-acetaminophen, ondansetron **OR** ondansetron (ZOFRAN) IV    Time Spent in minutes   10 minutes   Susa Raring M.D on 03/15/2020 at 9:05 AM  Between 7am to 7pm - Pager - [phone]  After 7pm go to [email or web address] - password [email or web address]  And look for the night coverage person covering for me after hours  [facility] Office  [phone]    Subjective:   Carolyn Bishop today has, No headache, No chest pain, No abdominal pain - No Nausea, No new weakness tingling or numbness, No Cough - SOB.    Objective:   Vitals:   03/14/20 1523  03/14/20 1943 03/15/20 0541 03/15/20 0734  BP: 104/89 (!) 157/63 (!) 144/86 (!) 140/52  Pulse: 90 85 79 63  Resp: 18 17 18 16   Temp: (!) 97.3 F (36.3 C) 99 F (37.2 C) 97.7 F (36.5 C) 98.2 F (36.8 C)  TempSrc: Oral Oral Oral Oral  SpO2: 99% 96% 98% 100%  Weight:      Height:        Wt Readings from Last 3 Encounters:  03/10/20 59 kg  12/20/19 62.6 kg  10/31/19 63.1 kg     Intake/Output Summary (Last 24 hours) at 03/15/2020 0905 Last  data filed at 03/14/2020 1700 Gross per 24 hour  Intake 480 ml  Output --  Net 480 ml    Exam Awake, pleasantly confused, No new F.N deficits,   [place].AT,PERRAL Supple Neck,No JVD, No cervical lymphadenopathy appriciated.  Symmetrical Chest wall movement, Good air movement bilaterally, CTAB RRR,No Gallops,Rubs or new Murmurs, No Parasternal Heave +ve B.Sounds, Abd Soft, Non tender, No organomegaly appriciated, No rebound - guarding or rigidity. No Cyanosis, Clubbing or edema, No new Rash or bruise      Data Review

## 2020-03-15 NOTE — TOC Progression Note (Signed)
Transition of Care [facility]) - Progression Note    Patient Details  Name: Nylani Michetti MRN: [id number] Date of Birth: [birth year]  Transition of [facility]) CM/SW Contact  Jimmy Picket, [place] Phone Number: 03/15/2020, 4:12 PM  Clinical Narrative:     CSW spoke to pts niece Mellanie. CSW explained that pts insurance denied SNF.  Mellaine stated that pt cannot return home because she lives with her elderly sisters and Mellaine works and is unable to house her as well.   Mellanie stated that pt has a DSS Child psychotherapist and that they have statrted a medicaid application. DSS social worker is  Braulio Bosch, [phone]. CSW explained that if pt has a medicaid pending application we may be able to ask for a LOG. CSW explained that this would probably have to wait til Monday. CSW reached out to [facility] but there was no answer.   Expected Discharge Plan: Skilled Nursing Facility Barriers to Discharge: Insurance Authorization  Expected Discharge Plan and Services Expected Discharge Plan: Skilled Nursing Facility   Discharge Planning Services: CM Consult Post Acute Care Choice: Skilled Nursing Facility Living arrangements for the past 2 months: Single Family Home Expected Discharge Date: 03/14/20                                     Social Determinants of Health (SDOH) Interventions    Readmission Risk Interventions Readmission Risk Prevention Plan 03/13/2020  Transportation Screening Complete  PCP or Specialist Appt within 5-7 Days Complete  Home Care Screening Complete  Medication Review (RN CM) Complete  Some recent data might be hidden   Jimmy Picket, Theresia Majors, [facility] Clinical Social Worker [phone]

## 2020-03-16 LAB — CULTURE, BLOOD (ROUTINE X 2): Culture: NO GROWTH

## 2020-03-16 LAB — GLUCOSE, CAPILLARY
Glucose-Capillary: 102 mg/dL — ABNORMAL HIGH (ref 70–99)
Glucose-Capillary: 126 mg/dL — ABNORMAL HIGH (ref 70–99)
Glucose-Capillary: 198 mg/dL — ABNORMAL HIGH (ref 70–99)
Glucose-Capillary: 207 mg/dL — ABNORMAL HIGH (ref 70–99)
Glucose-Capillary: 209 mg/dL — ABNORMAL HIGH (ref 70–99)
Glucose-Capillary: 93 mg/dL (ref 70–99)

## 2020-03-16 MED ORDER — POTASSIUM CHLORIDE CRYS ER 20 MEQ PO TBCR
40.0000 meq | EXTENDED_RELEASE_TABLET | Freq: Once | ORAL | Status: AC
  Administered 2020-03-16: 40 meq via ORAL
  Filled 2020-03-16: qty 2

## 2020-03-16 NOTE — Progress Notes (Signed)
[facility]                                                                                                                                                                         Patient Demographics  Carolyn Bishop, is a 86 y.o. female  WUJ:[id number]  [id number]:[id number]  DOB - [birth year]  Admit date - 03/10/2020  Admitting Physician Therisa Doyne, MD  Outpatient Primary MD for the patient is Janyth Contes, Janene Harvey, NP  LOS - 5   Chief Complaint  Patient presents with  . Fever        Assessment & Plan    Patient seen briefly today due for discharge soon per Discharge done on 03/14/20 by Dr Jerral Ralph, no further issues, Vital signs stable, patient feels fine. Discharge to SNF once bed available, insurance authorization is still pending.    Medications  Scheduled Meds: . amLODipine  10 mg Oral Daily  . apixaban  10 mg Oral BID   Followed by  . [START ON 03/17/2020] apixaban  5 mg Oral BID  . cephALEXin  250 mg Oral Q8H  . cyanocobalamin  1,000 mcg Subcutaneous Daily  . docusate sodium  100 mg Oral BID  . feeding supplement  237 mL Oral BID BM  . mouth rinse  15 mL Mouth Rinse BID  . memantine  10 mg Oral BID  . multivitamin with minerals  1 tablet Oral Daily  . potassium chloride SA  40 mEq Oral Once  . thiamine  100 mg Oral Daily   Continuous Infusions: PRN Meds:.acetaminophen **OR** acetaminophen, acetaminophen, HYDROcodone-acetaminophen, ondansetron **OR** ondansetron (ZOFRAN) IV    Time Spent in minutes   10 minutes   Susa Raring M.D on 03/16/2020 at 9:58 AM  Between 7am to 7pm - Pager - [phone]  After 7pm go to [email or web address] - password [email or web address]  And look for the night coverage person covering for me after hours  [facility] Office  [phone]    Subjective:   [name] today has, No headache, No chest pain, No abdominal pain - No Nausea, No new weakness  tingling or numbness, No Cough - SOB.    Objective:   Vitals:   03/15/20 0734 03/15/20 1620 03/16/20 0348 03/16/20 0753  BP: (!) 140/52 139/62 139/62 (!) 143/61  Pulse: 63 82 62 63  Resp: 16 16 15 18   Temp: 98.2 F (36.8 C) 98.4 F (36.9 C) 98.3 F (36.8 C) 98.6 F (37 C)  TempSrc: Oral Oral Oral Oral  SpO2: 100% 98% 98% 100%  Weight:      Height:        Wt Readings from Last 3 Encounters:  03/10/20 59 kg  12/20/19 62.6 kg  10/31/19 63.1 kg  Intake/Output Summary (Last 24 hours) at 03/16/2020 0958 Last data filed at 03/16/2020 0900 Gross per 24 hour  Intake 840 ml  Output 1650 ml  Net -810 ml    Exam Awake, pleasantly confused, No new F.N deficits,   [place].AT,PERRAL Supple Neck,No JVD, No cervical lymphadenopathy appriciated.  Symmetrical Chest wall movement, Good air movement bilaterally, CTAB RRR,No Gallops,Rubs or new Murmurs, No Parasternal Heave +ve B.Sounds, Abd Soft, Non tender, No organomegaly appriciated, No rebound - guarding or rigidity. No Cyanosis, Clubbing or edema, No new Rash or bruise      Data Review

## 2020-03-17 LAB — GLUCOSE, CAPILLARY
Glucose-Capillary: 140 mg/dL — ABNORMAL HIGH (ref 70–99)
Glucose-Capillary: 196 mg/dL — ABNORMAL HIGH (ref 70–99)

## 2020-03-17 MED ORDER — CYANOCOBALAMIN 1000 MCG/ML IJ SOLN
1000.0000 ug | INTRAMUSCULAR | 0 refills | Status: DC
Start: 2020-03-17 — End: 2020-04-18

## 2020-03-17 MED ORDER — APIXABAN 5 MG PO TABS
5.0000 mg | ORAL_TABLET | Freq: Two times a day (BID) | ORAL | 0 refills | Status: DC
Start: 2020-03-17 — End: 2022-10-12

## 2020-03-17 MED ORDER — ENSURE ENLIVE PO LIQD: 237.0000 mL | Freq: Two times a day (BID) | ORAL | 0 refills | Status: DC

## 2020-03-17 MED ORDER — ADULT MULTIVITAMIN W/MINERALS CH: 1.0000 | ORAL_TABLET | Freq: Every day | ORAL | 0 refills | Status: DC

## 2020-03-17 NOTE — Care Management Important Message (Signed)
Important Message  Patient Details  Name: Carolyn Bishop MRN: [id number] Date of Birth: [birth year]   Medicare Important Message Given:  Yes     [name] P [name] 03/17/2020, 2:26 PM

## 2020-03-17 NOTE — TOC Progression Note (Addendum)
Transition of Care [facility]) - Progression Note    Patient Details  Name: Carolyn Bishop MRN: [id number] Date of Birth: [birth year]  [facility]) CM/SW Contact  Janae Bridgeman, RN Phone Number: 03/17/2020, 12:04 PM  Clinical Narrative:    Case management spoke with Carolyn Bishop, CSW and patient was declined for short term rehab for SNF placement by the patient's insurance company.  I spoke with Carolyn Bishop, supervisor at [facility], MSW and [facility], unfortunately, will be unable to provide the patient with an LOG for SNF placement until the patient's Medicaid is cleared for Long Term SNF placement.    I called and left a message with the niece, Carolyn Bishop, to discuss the matter and reset the patient back up with [facility] home health services including RN, aide, MSW, PT/OT and ST.  03/27/2020- I spoke with the niece, Carolyn Bishop on the phone and the patient will be discharged home today with full home health services through [facility] health.  I called and spoke with [place] at [facility] and all home health services were reordered.  The patient has a hospital bed and all needed dme at home at this time.  Patient will need PTAR to home today.  Carolyn Heck, RN is aware of the patient's discharge home as well as the family.  I messaged Dr. Thedore Mins, and he is aware that the patient was declined for SNF placement by [facility] and will be returning home with home health services.  Case management spoke to the patient's niece, Carolyn Bishop, regarding choice for outpatient palliative care services and the niece did not have a preference and chose [facility] services.  I called and spoke with Christlyn, RNCM with [facility] and they will follow the patient on an outpatient basis.  03/17/2020 1315 - called and left a message with the patient's niece to notify her that the patient will be placed on Eliquis and the medication assistance card will be placed in the  PTAR packet for home.  The patient's niece will obtain the Free Trial offer for Eliquis and take it to the pharmacy to fill the medication as instructed on the phone by myself.   03/17/2020-  1440- PTAR called and transportation arranged for 4 pm today for patient's discharge home.  The patient has a hospital bed and all needed equipment at home.  Carolyn Bishop, primary RN will call the family and go over discharge instructions with them prior to patient's discharge to home.  Will continue to follow the patient in preparation for discharge to home.   Expected Discharge Plan: Skilled Nursing Facility Barriers to Discharge: Insurance Authorization  Expected Discharge Plan and Services Expected Discharge Plan: Skilled Nursing Facility   Discharge Planning Services: CM Consult Post Acute Care Choice: Skilled Nursing Facility Living arrangements for the past 2 months: Single Family Home Expected Discharge Date: 03/14/20                                     Social Determinants of Health (SDOH) Interventions    Readmission Risk Interventions Readmission Risk Prevention Plan 03/13/2020  Transportation Screening Complete  PCP or Specialist Appt within 5-7 Days Complete  Home Care Screening Complete  Medication Review (RN CM) Complete  Some recent data might be hidden

## 2020-03-17 NOTE — Progress Notes (Signed)
Physical Therapy Treatment Patient Details Name: Carolyn Bishop MRN: [id number] DOB: [birth year] Today's Date: 03/17/2020    History of Present Illness Pt is an 86 y.o. female admitted from home on 03/10/20 with AMS, bed sores and multiple falls OOB recently. Head CT negative for acute changes. Workup for RLE DVT, UTI with encephalopathy. PMH includes HTN, DM, HLD, obesity.    PT Comments    Pt supine on arrival, drowsy but easily awoken, pleasantly confused and participatory as able. Pt remains limited due to baseline cognitive deficit and continues to express significant fear of falls, however able to perform supine BLE/BUE A/AAROM therapeutic exercises as detailed below with good tolerance. Pt given HEP handout (placed in pt belonging bag, link: [email or web address] Access Code: [email or web address]) which includes UE/LE exercises as well as instructions on pressure offloading for family education (family not present in room unfortunately at time of session), pt also instructed on importance of pressure offloading. Pt bed placed in chair position at end of session and pillows under R hip/shoulder to prevent pushing to R side. Pt max/totalA for bed mobility this session. Pt continues to benefit from PT services to progress toward functional mobility goals. D/C recs below remain appropriate, pending progress. If pt instead D/C home, pt will need 24/7 S/A and physical assist for pressure offloading and all mobility tasks.  Follow Up Recommendations  SNF;Supervision/Assistance - 24 hour     Equipment Recommendations  Other (comment) (defer to next venue; per pt family, they own hospitalbed DME)    Recommendations for Other Services       Precautions / Restrictions Precautions Precautions: Fall;Other (comment) Precaution Comments: Anxious/fearful of falling Restrictions Weight Bearing Restrictions: No    Mobility  Bed Mobility Overal bed mobility: Needs Assistance Bed Mobility: Rolling (supine to  partial long sit) Rolling: Max assist   Supine to sit: HOB elevated;Max assist;Total assist     General bed mobility comments: pt able to pull up on B bed rails to reach partial long sit posture, but unable to tolerate full upright posture 2/2 anxiety/confusion. Bed placed in chair position at end of session for pressure offloading and pillows behind R hip/shoulder to offload as pt tending to pull her trunk to R railing compulsively  Transfers                    Ambulation/Gait                 Stairs             Wheelchair Mobility    Modified Rankin (Stroke Patients Only)       Balance                                            Cognition Arousal/Alertness: Awake/alert Behavior During Therapy: Anxious Overall Cognitive Status: History of cognitive impairments - at baseline Area of Impairment: Orientation;Attention;Memory;Following commands;Safety/judgement;Awareness;Problem solving                 Orientation Level: Disoriented to;Place;Time;Situation Current Attention Level: Sustained Memory: Decreased recall of precautions;Decreased short-term memory Following Commands: Follows one step commands inconsistently;Follows one step commands with increased time Safety/Judgement: Decreased awareness of safety;Decreased awareness of deficits   Problem Solving: Slow processing;Decreased initiation;Difficulty sequencing;Requires verbal cues;Requires tactile cues General Comments: pleasantly confused and difficulty sequencing mobility tasks due to anxiety/fear of falls and perseverating on this. Pt at  one point says "how will I get back to bed?" while she is in bed.      Exercises General Exercises - Lower Extremity Ankle Circles/Pumps: AAROM;Strengthening;Both;15 reps;Supine Short Arc Quad: AAROM;Both;15 reps;Supine Long Arc Quad: AAROM;Both;15 reps;Seated (bed in chair position) Heel Slides: AAROM;Both;15 reps;Supine Hip  ABduction/ADduction: AAROM;Both;15 reps;Limitations Hip Abduction/Adduction Limitations: pt having difficulty with full B knee extension due to knee flexion contracture, needs tactile assist and cues to perform but unable to keep BLE from externally rotating without AA Hip Flexion/Marching: Both;10 reps;Seated;AROM (bed in chair position) Other Exercises Other Exercises: pt performed supine BUE shoulder flexion and "alphabet" with each UE, needing active assist to "spell" each letter, but able to assist. Pt seemed to enjoy this exercise    General Comments        Pertinent Vitals/Pain Pain Assessment: Faces Faces Pain Scale: Hurts a little bit Pain Descriptors / Indicators: Discomfort Pain Intervention(s): Monitored during session;Repositioned    Home Living                      Prior Function            PT Goals (current goals can now be found in the care plan section) Acute Rehab PT Goals Patient Stated Goal: go to church PT Goal Formulation: Patient unable to participate in goal setting Time For Goal Achievement: 03/25/20 Potential to Achieve Goals: Fair Progress towards PT goals: Progressing toward goals (slow progress)    Frequency    Min 2X/week      PT Plan Current plan remains appropriate    Co-evaluation              AM-PAC PT "6 Clicks" Mobility   Outcome Measure  Help needed turning from your back to your side while in a flat bed without using bedrails?: Total Help needed moving from lying on your back to sitting on the side of a flat bed without using bedrails?: Total Help needed moving to and from a bed to a chair (including a wheelchair)?: Total Help needed standing up from a chair using your arms (e.g., wheelchair or bedside chair)?: Total Help needed to walk in hospital room?: Total Help needed climbing 3-5 steps with a railing? : Total 6 Click Score: 6    End of Session   Activity Tolerance: Patient tolerated treatment  well Patient left: in bed;with call bell/phone within reach;with bed alarm set Nurse Communication: Mobility status PT Visit Diagnosis: Unsteadiness on feet (R26.81);Muscle weakness (generalized) (M62.81);Difficulty in walking, not elsewhere classified (R26.2)     Time: 1829-9371 PT Time Calculation (min) (ACUTE ONLY): 27 min  Charges:  $Therapeutic Exercise: 8-22 mins $Therapeutic Activity: 8-22 mins                     Belia Febo P., PTA Acute Rehabilitation Services Pager: [phone] Office: [phone]   [place] 03/17/2020, 5:00 PM

## 2020-03-17 NOTE — Progress Notes (Addendum)
[facility]                                                                                                                                                                         Patient Demographics  Carolyn Bishop, is a 86 y.o. female  MOL:[id number]  EEF:[id number]  DOB - 09/30/1932  Admit date - 03/10/2020  Admitting Physician Therisa Doyne, MD  Outpatient Primary MD for the patient is Janyth Contes, Janene Harvey, NP  LOS - 6   Chief Complaint  Patient presents with  . Fever        Assessment & Plan    Patient seen briefly today due for discharge soon per Discharge done on 03/14/20 by Dr Jerral Ralph, no further issues, Vital signs stable, patient feels fine. Discharge to home with home health PT as insurance declined SNF, new prescription sent to the pharmacy, insurance authorization is still pending.  Discharge on dysphagia three diet.   Allergies as of 03/17/2020   No Known Allergies     Medication List    TAKE these medications   acetaminophen 500 MG tablet Commonly known as: TYLENOL Take 1,000 mg by mouth every 6 (six) hours as needed for mild pain or headache.   amLODipine 5 MG tablet Commonly known as: NORVASC Take 1 tablet (5 mg total) by mouth daily.   apixaban 5 MG Tabs tablet Commonly known as: ELIQUIS Take 1 tablet (5 mg total) by mouth 2 (two) times daily. First dose on Mon 03/17/20 at 2200   cyanocobalamin 1000 MCG/ML injection Commonly known as: (VITAMIN B-12) Inject 1 mL (1,000 mcg total) into the skin every 30 (thirty) days.   feeding supplement Liqd Take 237 mLs by mouth 2 (two) times daily between meals.   memantine 10 MG tablet Commonly known as: NAMENDA Take 10 mg by mouth 2 (two) times daily.   multivitamin with minerals Tabs tablet Take 1 tablet by mouth daily.   vitamin A & D ointment Apply 1 application topically as needed for dry skin.              Discharge Care Instructions  (From admission, onward)         Start     Ordered   03/14/20 0000  Discharge wound care:       Comments: Continue foam dressing to sacrum. Peel down foam dressing EACH shift.  Record your observations.  Change foam dressing every 3 days or PRN soiling.   03/14/20 0914         Medications  Scheduled Meds: . amLODipine  10 mg Oral Daily  . apixaban  10 mg Oral BID   Followed by  . apixaban  5 mg Oral BID  . cephALEXin  250 mg Oral Q8H  . cyanocobalamin  1,000 mcg Subcutaneous Daily  . docusate sodium  100 mg Oral BID  . feeding supplement  237 mL Oral BID BM  . mouth rinse  15 mL Mouth Rinse BID  . memantine  10 mg Oral BID  . multivitamin with minerals  1 tablet Oral Daily  . thiamine  100 mg Oral Daily   Continuous Infusions: PRN Meds:.acetaminophen **OR** acetaminophen, acetaminophen, HYDROcodone-acetaminophen, ondansetron **OR** ondansetron (ZOFRAN) IV    Time Spent in minutes   10 minutes   Susa Raring M.D on 03/17/2020 at 9:14 AM  Between 7am to 7pm - Pager - [phone]  After 7pm go to [email or web address] - password [email or web address]  And look for the night coverage person covering for me after hours  [facility] Office  [phone]    Subjective:   Christyne Mccain today in bed eating breakfast appears to be in no distress, pleasantly confused but denies any headache chest or abdominal pain.  No shortness of breath.  Objective:   Vitals:   03/16/20 1608 03/16/20 2000 03/17/20 0252 03/17/20 0737  BP: 123/71 140/64 (!) 131/57 (!) 149/93  Pulse: 78 82 67 72  Resp: 18 17 16 17   Temp: 98.4 F (36.9 C) 98.1 F (36.7 C) 98.4 F (36.9 C) 98 F (36.7 C)  TempSrc: Oral Oral Oral Oral  SpO2: 98% 96% 100% 100%  Weight:      Height:        Wt Readings from Last 3 Encounters:  03/10/20 59 kg  12/20/19 62.6 kg  10/31/19 63.1 kg     Intake/Output Summary (Last 24 hours) at 03/17/2020 0914 Last data filed at 03/17/2020  0700 Gross per 24 hour  Intake 720 ml  Output 550 ml  Net 170 ml    Exam  Awake and remains pleasantly confused, in no distress, No new F.N deficits, Normal affect [place].AT,PERRAL Supple Neck,No JVD, No cervical lymphadenopathy appriciated.  Symmetrical Chest wall movement, Good air movement bilaterally, CTAB RRR,No Gallops, Rubs or new Murmurs, No Parasternal Heave +ve B.Sounds, Abd Soft, No tenderness, No organomegaly appriciated, No rebound - guarding or rigidity. No Cyanosis, Clubbing or edema, No new Rash or bruise     Data Review

## 2020-03-18 ENCOUNTER — Telehealth: Payer: Self-pay | Admitting: *Deleted

## 2020-03-18 NOTE — Telephone Encounter (Signed)
I have made the 1st attempt to contact the patient or family member in charge, in order to follow up from recently being discharged from the hospital. No Answer and no voicemail but I will make another attempt at a different time.

## 2020-03-19 NOTE — Telephone Encounter (Signed)
I have made the 2nd attempt to contact the patient or family member in charge, in order to follow up from recently being discharged from the hospital. Cannot leave a message but I will make another attempt at a different time.

## 2020-03-20 ENCOUNTER — Telehealth: Payer: Self-pay | Admitting: *Deleted

## 2020-03-20 NOTE — Telephone Encounter (Signed)
I have made the 3rd attempt to contact the patient or family member in charge, in order to follow up from recently being discharged from the hospital. No Answer and No Voicemail.

## 2020-03-20 NOTE — Telephone Encounter (Signed)
Yes, I thought that she would go to SNF actually since she was having multiple falls at home and needing more supervision.

## 2020-03-20 NOTE — Telephone Encounter (Signed)
Denice notified.

## 2020-03-20 NOTE — Telephone Encounter (Signed)
Denice with [facility] called stating that the patient was discharged home with Palliative Care Service orders.  Denice, nurse is wanting to make sure this is ok with you.  Please Advise.

## 2020-03-24 ENCOUNTER — Encounter (HOSPITAL_COMMUNITY): Payer: Self-pay | Admitting: Emergency Medicine

## 2020-03-24 ENCOUNTER — Ambulatory Visit: Payer: Medicare HMO | Admitting: Nurse Practitioner

## 2020-03-24 ENCOUNTER — Emergency Department (HOSPITAL_COMMUNITY)
Admission: EM | Admit: 2020-03-24 | Discharge: 2020-03-24 | Disposition: A | Discharge status: Home / Self Care | Payer: Medicare HMO | Attending: Emergency Medicine | Admitting: Emergency Medicine

## 2020-03-24 ENCOUNTER — Emergency Department (HOSPITAL_COMMUNITY): Payer: Medicare HMO

## 2020-03-24 DIAGNOSIS — R296 Repeated falls: Secondary | ICD-10-CM | POA: Insufficient documentation

## 2020-03-24 DIAGNOSIS — Y9389 Activity, other specified: Secondary | ICD-10-CM | POA: Insufficient documentation

## 2020-03-24 DIAGNOSIS — I5032 Chronic diastolic (congestive) heart failure: Secondary | ICD-10-CM | POA: Diagnosis not present

## 2020-03-24 DIAGNOSIS — Y92008 Other place in unspecified non-institutional (private) residence as the place of occurrence of the external cause: Secondary | ICD-10-CM | POA: Insufficient documentation

## 2020-03-24 DIAGNOSIS — Z86718 Personal history of other venous thrombosis and embolism: Secondary | ICD-10-CM | POA: Insufficient documentation

## 2020-03-24 DIAGNOSIS — F0390 Unspecified dementia without behavioral disturbance: Secondary | ICD-10-CM | POA: Insufficient documentation

## 2020-03-24 DIAGNOSIS — E119 Type 2 diabetes mellitus without complications: Secondary | ICD-10-CM | POA: Diagnosis not present

## 2020-03-24 DIAGNOSIS — M7918 Myalgia, other site: Secondary | ICD-10-CM | POA: Diagnosis not present

## 2020-03-24 DIAGNOSIS — Y999 Unspecified external cause status: Secondary | ICD-10-CM | POA: Diagnosis not present

## 2020-03-24 DIAGNOSIS — I110 Hypertensive heart disease with heart failure: Secondary | ICD-10-CM | POA: Diagnosis not present

## 2020-03-24 DIAGNOSIS — G8929 Other chronic pain: Secondary | ICD-10-CM | POA: Diagnosis not present

## 2020-03-24 DIAGNOSIS — Z87440 Personal history of urinary (tract) infections: Secondary | ICD-10-CM | POA: Diagnosis not present

## 2020-03-24 DIAGNOSIS — Z7984 Long term (current) use of oral hypoglycemic drugs: Secondary | ICD-10-CM | POA: Insufficient documentation

## 2020-03-24 DIAGNOSIS — W19XXXA Unspecified fall, initial encounter: Secondary | ICD-10-CM | POA: Insufficient documentation

## 2020-03-24 DIAGNOSIS — Z7901 Long term (current) use of anticoagulants: Secondary | ICD-10-CM | POA: Diagnosis not present

## 2020-03-24 DIAGNOSIS — Z79899 Other long term (current) drug therapy: Secondary | ICD-10-CM | POA: Diagnosis not present

## 2020-03-24 DIAGNOSIS — Z7189 Other specified counseling: Secondary | ICD-10-CM

## 2020-03-24 DIAGNOSIS — Z515 Encounter for palliative care: Secondary | ICD-10-CM

## 2020-03-24 LAB — URINALYSIS, ROUTINE W REFLEX MICROSCOPIC
Bacteria, UA: NONE SEEN
Bilirubin Urine: NEGATIVE
Glucose, UA: NEGATIVE mg/dL
Hgb urine dipstick: NEGATIVE
Ketones, ur: NEGATIVE mg/dL
Leukocytes,Ua: NEGATIVE
Nitrite: NEGATIVE
Protein, ur: 100 mg/dL — AB
Specific Gravity, Urine: 1.019 (ref 1.005–1.030)
pH: 5 (ref 5.0–8.0)

## 2020-03-24 LAB — CBC WITH DIFFERENTIAL/PLATELET
Abs Immature Granulocytes: 0.03 10*3/uL (ref 0.00–0.07)
Basophils Absolute: 0 10*3/uL (ref 0.0–0.1)
Basophils Relative: 0 %
Eosinophils Absolute: 0.1 10*3/uL (ref 0.0–0.5)
Eosinophils Relative: 1 %
HCT: 33.1 % — ABNORMAL LOW (ref 36.0–46.0)
Hemoglobin: 10.6 g/dL — ABNORMAL LOW (ref 12.0–15.0)
Immature Granulocytes: 0 %
Lymphocytes Relative: 9 %
Lymphs Abs: 0.7 10*3/uL (ref 0.7–4.0)
MCH: 29.4 pg (ref 26.0–34.0)
MCHC: 32 g/dL (ref 30.0–36.0)
MCV: 91.7 fL (ref 80.0–100.0)
Monocytes Absolute: 0.7 10*3/uL (ref 0.1–1.0)
Monocytes Relative: 9 %
Neutro Abs: 6.2 10*3/uL (ref 1.7–7.7)
Neutrophils Relative %: 81 %
Platelets: 288 10*3/uL (ref 150–400)
RBC: 3.61 MIL/uL — ABNORMAL LOW (ref 3.87–5.11)
RDW: 13.8 % (ref 11.5–15.5)
WBC: 7.8 10*3/uL (ref 4.0–10.5)
nRBC: 0 % (ref 0.0–0.2)

## 2020-03-24 LAB — COMPREHENSIVE METABOLIC PANEL
ALT: 17 U/L (ref 0–44)
AST: 21 U/L (ref 15–41)
Albumin: 3 g/dL — ABNORMAL LOW (ref 3.5–5.0)
Alkaline Phosphatase: 49 U/L (ref 38–126)
Anion gap: 9 (ref 5–15)
BUN: 21 mg/dL (ref 8–23)
CO2: 25 mmol/L (ref 22–32)
Calcium: 9.3 mg/dL (ref 8.9–10.3)
Chloride: 109 mmol/L (ref 98–111)
Creatinine, Ser: 0.76 mg/dL (ref 0.44–1.00)
GFR, Estimated: >60 mL/min (ref >60)
Glucose, Bld: 185 mg/dL — ABNORMAL HIGH (ref 70–99)
Potassium: 3.4 mmol/L — ABNORMAL LOW (ref 3.5–5.1)
Sodium: 143 mmol/L (ref 135–145)
Total Bilirubin: 0.6 mg/dL (ref 0.3–1.2)
Total Protein: 6.6 g/dL (ref 6.5–8.1)

## 2020-03-24 NOTE — ED Triage Notes (Signed)
Pt arrives via [facility] from home, pt found in supine position by her sisters whom she lives with. Pt has no recollection of fall, just remembers being in bed and then paramedics arriving. Pt a/ox4, denies pain at this time. HR 80, 12 lead unremarkable. Pt does take eliquis. Pt has significant hx of falls, per EMS.

## 2020-03-24 NOTE — ED Notes (Signed)
Patient transported to CT 

## 2020-03-24 NOTE — Progress Notes (Signed)
°   03/24/20 1742  TOC ED Mini Assessment  TOC Time spent with patient (minutes): 30  PING Used in TOC Assessment Yes  Admission or Readmission Diverted Yes  Interventions which prevented an admission or readmission [place] or Services  What brought you to the Emergency Department?  mechanical unwitness fall  Means of departure Ambulance  Patient was brought in to the [facility] by EMS after being called out for patient's sister, EMS found patient on the floor at home. Patient was recently discharge from inpatient home to start with [facility] services with, which patient states they have not heard from [facility] as of yet.  CM met patient in [place] -Bed 20 to verify information with patient. CM will contact liaison for [facility] to arrange her [facility] services. CM will follow up with patient tomorrow 11/16

## 2020-03-24 NOTE — ED Notes (Signed)
Changed pt, applied clean brief.

## 2020-03-24 NOTE — Care Management (Signed)
Contacted [facility] Liaison [place] who verified patient is active with [facility]. She will follow up tomorrow.

## 2020-03-24 NOTE — ED Provider Notes (Signed)
[facility] EMERGENCY DEPARTMENT Provider Note   CSN: [id number] Arrival date & time: 03/24/20  4098     History Chief Complaint  Patient presents with  . Fall    Carolyn Bishop is a 86 y.o. female.  HPI     86yo female with dementia, type II DM, htn, recnet admissino for DVT on anticoagulation, UTI with encephalopathy, frequent falls, who lives with sisters and caretaker who presents with concern for fall.   Frequent falls Trying to get her placed in SNF but insurance doesn't cover it and social services concerned Lives with 2 sisters, neither one can care for her, had people coming in/caretaker. Was supposed to have home health but have not seen them since hospital discharge   Last night was having episode of "thinking she can walk", was getting out of the bed, delirium  Per niece, EMS was called out this AM because of her sister's fall, but when EMS assessed them decided to take her to ED given fall on blood thinners  Bedbound for months since prior admission in July, was afraid to stand/fall  Ongoing problem of placement, working with Child psychotherapist and completing an emergency application  Chronic whole body aching and leg pain  Doctor diagnosed her with dementia, has good days and bad days, some days unable to remember  Today, patient reports she remembers hearing her sister call out and then waking up on the floor Not sure if she rolled out of bed or if she was attempting to walk and fell with LOC.  [name] states that she can walk with walker and lately without a walker (which her niece denies, reports she is bedbound but at times believes she can walk)    Patient and niece report no other acute illness, no n/v/d, no CP or dyspnea, no cough.  Patient denies any concerns on my history. Reports it is October 2021    Past Medical History:  Diagnosis Date  . Abnormal gait    Onset 02/05/2015  . Arthritis   . Benign essential hypertension   .  Diabetes (HCC)   . Diabetes mellitus type II, controlled, with no complications (HCC)   . HLD (hyperlipidemia)   . HTN (hypertension)   . Obesity   . Palpitations   . Vitamin D deficiency     Patient Active Problem List   Diagnosis Date Noted  . Palliative care by specialist   . Goals of care, counseling/discussion   . UTI (urinary tract infection) 03/11/2020  . DM (diabetes mellitus), type 2 (HCC) 03/11/2020  . Debility 03/11/2020  . DVT (deep venous thrombosis) (HCC) 03/11/2020  . Chronic diastolic CHF (congestive heart failure) (HCC) 03/11/2020  . Pressure injury of skin 03/10/2020  . Malnutrition of moderate degree 12/22/2019  . Fall at home 12/20/2019  . Acute encephalopathy 10/28/2019  . Grade I diastolic dysfunction 10/28/2019  . HLD (hyperlipidemia) 10/28/2017  . Acute urinary retention   . AKI (acute kidney injury) (HCC) 10/27/2017  . Hypotension 10/27/2017  . Prediabetes 10/27/2017  . Essential hypertension 10/27/2017  . Hypoglycemia 10/27/2017  . Normocytic anemia 10/27/2017  . GIB (gastrointestinal bleeding) 10/27/2017  . Lung mass 10/27/2017  . Lactic acid acidosis 10/27/2017  . Left arm weakness 10/27/2017    Past Surgical History:  Procedure Laterality Date  . ABDOMINAL HYSTERECTOMY    . BIOPSY  10/29/2017   Procedure: BIOPSY;  Surgeon: Kerin Salen, MD;  Location: [facility];  Service: Gastroenterology;;  . ESOPHAGOGASTRODUODENOSCOPY (EGD) WITH  PROPOFOL N/A 10/29/2017   Procedure: ESOPHAGOGASTRODUODENOSCOPY (EGD) WITH PROPOFOL;  Surgeon: Kerin SalenKarki, Arya, MD;  Location: [facility];  Service: Gastroenterology;  Laterality: N/A;     OB History   No obstetric history on file.     Family History  Problem Relation Age of Onset  . Hypertension Mother 732  . Lung cancer Father 4375       [employer] Paper[employer]neumoconiosis   . Heart Problems Sister   . Kidney failure Sister   . Diabetes Sister   . Congestive Heart Failure Sister        CHF    Social History    Tobacco Use  . Smoking status: Never Smoker  . Smokeless tobacco: Never Used  Substance Use Topics  . Alcohol use: No    Alcohol/week: 0.0 standard drinks  . Drug use: No    Home Medications Prior to Admission medications   Medication Sig Start Date End Date Taking? Authorizing Provider  acetaminophen (TYLENOL) 500 MG tablet Take 1,000 mg by mouth every 6 (six) hours as needed for mild pain or headache.    [provider]  amLODipine (NORVASC) 5 MG tablet Take 1 tablet (5 mg total) by mouth daily. 02/05/20 02/04/21  Sharon SellerEubanks, Jessica K, NP  apixaban (ELIQUIS) 5 MG TABS tablet Take 1 tablet (5 mg total) by mouth 2 (two) times daily. First dose on Mon 03/17/20 at 2200 03/17/20   Leroy SeaSingh, Prashant K, MD  cyanocobalamin (,VITAMIN B-12,) 1000 MCG/ML injection Inject 1 mL (1,000 mcg total) into the skin every 30 (thirty) days. 03/17/20   Leroy SeaSingh, Prashant K, MD  feeding supplement (ENSURE ENLIVE / ENSURE PLUS) LIQD Take 237 mLs by mouth 2 (two) times daily between meals. 03/17/20 04/16/20  Leroy SeaSingh, Prashant K, MD  memantine (NAMENDA) 10 MG tablet Take 10 mg by mouth 2 (two) times daily.    [provider]  Multiple Vitamin (MULTIVITAMIN WITH MINERALS) TABS tablet Take 1 tablet by mouth daily. 03/17/20   Leroy SeaSingh, Prashant K, MD  Vitamins A & D (VITAMIN A & D) ointment Apply 1 application topically as needed for dry skin.    [provider]    Allergies    Patient has no known allergies.  Review of Systems   Review of Systems  Constitutional: Negative for fever.  HENT: Negative for sore throat.   Eyes: Negative for visual disturbance.  Respiratory: Negative for cough and shortness of breath.   Cardiovascular: Negative for chest pain.  Gastrointestinal: Negative for abdominal pain.  Genitourinary: Negative for difficulty urinating.  Musculoskeletal: Positive for myalgias. Negative for back pain and neck pain.  Skin: Negative for rash.  Neurological: Negative for syncope and  headaches.    Physical Exam Updated Vital Signs BP (!) 151/69 (BP Location: Left Arm)   Pulse 69   Temp 97.6 F (36.4 C) (Oral)   Resp 16   SpO2 100%   Physical Exam Vitals and nursing note reviewed.  Constitutional:      General: She is not in acute distress.    Appearance: She is well-developed. She is not diaphoretic.  HENT:     Head: Normocephalic and atraumatic.  Eyes:     Conjunctiva/sclera: Conjunctivae normal.  Cardiovascular:     Rate and Rhythm: Normal rate and regular rhythm.     Heart sounds: Normal heart sounds. No murmur heard.  No friction rub. No gallop.   Pulmonary:     Effort: Pulmonary effort is normal. No respiratory distress.  Breath sounds: Normal breath sounds. No wheezing or rales.  Abdominal:     General: There is no distension.     Palpations: Abdomen is soft.     Tenderness: There is no abdominal tenderness. There is no guarding.  Musculoskeletal:        General: No tenderness.     Cervical back: Normal range of motion.  Skin:    General: Skin is warm and dry.     Findings: No erythema or rash.  Neurological:     Mental Status: She is alert.     Comments: Oriented to self, location, states a Friday October 2021 (rather than Monday/Nov)     ED Results / Procedures / Treatments   Labs (all labs ordered are listed, but only abnormal results are displayed) Labs Reviewed  CBC WITH DIFFERENTIAL/PLATELET - Abnormal; Notable for the following components:      Result Value   RBC 3.61 (*)    Hemoglobin 10.6 (*)    HCT 33.1 (*)    All other components within normal limits  COMPREHENSIVE METABOLIC PANEL - Abnormal; Notable for the following components:   Potassium 3.4 (*)    Glucose, Bld 185 (*)    Albumin 3.0 (*)    All other components within normal limits  URINALYSIS, ROUTINE W REFLEX MICROSCOPIC - Abnormal; Notable for the following components:   APPearance HAZY (*)    Protein, ur 100 (*)    All other components within normal limits    URINE CULTURE    EKG EKG Interpretation  Date/Time:  Monday March 24 2020 10:19:53 EST Ventricular Rate:  69 PR Interval:    QRS Duration: 131 QT Interval:  402 QTC Calculation: 431 R Axis:   12 Text Interpretation: Atrial fibrillation suspected, artifact, similar to prior Probable left ventricular hypertrophy No significant change since last tracing Since prior ECG, no significant changes Confirmed by Alvira Monday ([id number]) on 03/24/2020 12:04:40 PM   Radiology CT Head Wo Contrast  Result Date: 03/24/2020 CLINICAL DATA:  Unwitnessed fall EXAM: CT HEAD WITHOUT CONTRAST TECHNIQUE: Contiguous axial images were obtained from the base of the skull through the vertex without intravenous contrast. COMPARISON:  03/11/2020 FINDINGS: Brain: No evidence of acute infarction, hemorrhage, hydrocephalus, extra-axial collection or mass lesion/mass effect. Moderate low-density changes within the periventricular and subcortical white matter compatible with chronic microvascular ischemic change. Moderate diffuse cerebral volume loss. Vascular: Atherosclerotic calcifications involving the large vessels of the skull base. No unexpected hyperdense vessel. Skull: Normal. Negative for fracture or focal lesion. Sinuses/Orbits: Chronic mucosal thickening within the right sphenoid sinus. Remaining paranasal sinuses and mastoid air cells are clear. Other: None. IMPRESSION: 1. No acute intracranial findings. 2. Chronic microvascular ischemic change and cerebral volume loss. Electronically Signed   By: Duanne Guess D.O.   On: 03/24/2020 10:44    Procedures Procedures (including critical care time)  Medications Ordered in ED Medications - No data to display  ED Course  I have reviewed the triage vital signs and the nursing notes.  Pertinent labs & imaging results that were available during my care of the patient were reviewed by me and considered in my medical decision making (see chart for  details).    MDM Rules/Calculators/A&P                          86yo female with dementia, type II DM, htn, recnet admissino for DVT on anticoagulation, UTI with encephalopathy, frequent falls, who lives with  sisters and caretaker who presents with concern for fall--or pt found on floor without recollection of how she got there.  Given concern for possible fall on blood thinners, CT head completed which shows no evidence of ICH.  No signs of other injuries by history or exam.  Labs without acute abnormalities, no significant anemia or electrolyte abnormalities.  ECG similar to prior. No sign of UTI.  Red continued work as outpatient to obtain placement.  Consulted CM to check on Home health that had been arranged during recent hospitalization.     Final Clinical Impression(s) / ED Diagnoses Final diagnoses:  Fall, initial encounter  Dementia without behavioral disturbance, unspecified dementia type [facility])    Rx / DC Orders ED Discharge Orders    None       Alvira Monday, MD 03/25/20 [phone]

## 2020-03-25 ENCOUNTER — Other Ambulatory Visit: Payer: Self-pay

## 2020-03-25 ENCOUNTER — Encounter: Payer: Medicare HMO | Admitting: Nurse Practitioner

## 2020-03-25 ENCOUNTER — Telehealth: Payer: Self-pay

## 2020-03-25 LAB — URINE CULTURE: Culture: NO GROWTH

## 2020-03-25 NOTE — Progress Notes (Signed)
This encounter was created in error - please disregard.

## 2020-03-25 NOTE — Telephone Encounter (Signed)
Tried calling patient to start AWV, the phone rang several times with no answer and no vm to leave a message.  1st attempt 11:12 am 2nd attempt 11:27 am 3rd attempt 11:43 am

## 2020-03-27 ENCOUNTER — Telehealth: Payer: Self-pay

## 2020-03-27 NOTE — Telephone Encounter (Signed)
Left message for patient's niece Mellanie to return call to schedule Palliative care consult.

## 2020-04-04 ENCOUNTER — Telehealth: Payer: Self-pay

## 2020-04-04 NOTE — Telephone Encounter (Signed)
Spoke with patient's niece Mellanie to attempt to schedule Palliative consult appointment. She states she will call back she was in the store and needed "both hands"

## 2020-04-08 ENCOUNTER — Telehealth: Payer: Self-pay

## 2020-04-08 NOTE — Telephone Encounter (Signed)
Spoke with patients niece and scheduled a in-person Palliative Consult for 04/25/20 @ 12:30 PM   COVID screening was negative. No pets in home. Patient lives with sister and niece.   Consent obtained; updated Outlook/Netsmart/Team List and Epic.

## 2020-04-08 NOTE — Telephone Encounter (Signed)
Left message for patient's niece Mellanie to return call to schedule Palliative care consult. If no response by 12/1 will cancel referral since this is the 3rd attempt to schedule.

## 2020-04-11 ENCOUNTER — Emergency Department (HOSPITAL_COMMUNITY): Payer: Medicare HMO

## 2020-04-11 ENCOUNTER — Telehealth: Payer: Self-pay | Admitting: Nurse Practitioner

## 2020-04-11 ENCOUNTER — Inpatient Hospital Stay (HOSPITAL_COMMUNITY)
Admission: EM | Admit: 2020-04-11 | Discharge: 2020-04-18 | DRG: 641 | Disposition: A | Discharge status: Hospice | Payer: Medicare HMO | Attending: Internal Medicine | Admitting: Internal Medicine

## 2020-04-11 DIAGNOSIS — I82409 Acute embolism and thrombosis of unspecified deep veins of unspecified lower extremity: Secondary | ICD-10-CM | POA: Diagnosis present

## 2020-04-11 DIAGNOSIS — F0280 Dementia in other diseases classified elsewhere without behavioral disturbance: Secondary | ICD-10-CM | POA: Diagnosis not present

## 2020-04-11 DIAGNOSIS — L89152 Pressure ulcer of sacral region, stage 2: Secondary | ICD-10-CM | POA: Diagnosis present

## 2020-04-11 DIAGNOSIS — E1151 Type 2 diabetes mellitus with diabetic peripheral angiopathy without gangrene: Secondary | ICD-10-CM | POA: Diagnosis present

## 2020-04-11 DIAGNOSIS — I110 Hypertensive heart disease with heart failure: Secondary | ICD-10-CM | POA: Diagnosis present

## 2020-04-11 DIAGNOSIS — F0390 Unspecified dementia without behavioral disturbance: Secondary | ICD-10-CM | POA: Diagnosis present

## 2020-04-11 DIAGNOSIS — E860 Dehydration: Secondary | ICD-10-CM | POA: Diagnosis present

## 2020-04-11 DIAGNOSIS — Z86718 Personal history of other venous thrombosis and embolism: Secondary | ICD-10-CM

## 2020-04-11 DIAGNOSIS — Z7901 Long term (current) use of anticoagulants: Secondary | ICD-10-CM

## 2020-04-11 DIAGNOSIS — Z7189 Other specified counseling: Secondary | ICD-10-CM | POA: Diagnosis not present

## 2020-04-11 DIAGNOSIS — K5900 Constipation, unspecified: Secondary | ICD-10-CM | POA: Diagnosis present

## 2020-04-11 DIAGNOSIS — R54 Age-related physical debility: Secondary | ICD-10-CM | POA: Diagnosis not present

## 2020-04-11 DIAGNOSIS — I5032 Chronic diastolic (congestive) heart failure: Secondary | ICD-10-CM | POA: Diagnosis present

## 2020-04-11 DIAGNOSIS — Z20822 Contact with and (suspected) exposure to covid-19: Secondary | ICD-10-CM | POA: Diagnosis present

## 2020-04-11 DIAGNOSIS — R627 Adult failure to thrive: Secondary | ICD-10-CM | POA: Diagnosis present

## 2020-04-11 DIAGNOSIS — I82491 Acute embolism and thrombosis of other specified deep vein of right lower extremity: Secondary | ICD-10-CM | POA: Diagnosis not present

## 2020-04-11 DIAGNOSIS — Z90710 Acquired absence of both cervix and uterus: Secondary | ICD-10-CM | POA: Diagnosis not present

## 2020-04-11 DIAGNOSIS — Z66 Do not resuscitate: Secondary | ICD-10-CM | POA: Diagnosis present

## 2020-04-11 DIAGNOSIS — Z515 Encounter for palliative care: Secondary | ICD-10-CM | POA: Diagnosis not present

## 2020-04-11 DIAGNOSIS — E785 Hyperlipidemia, unspecified: Secondary | ICD-10-CM | POA: Diagnosis present

## 2020-04-11 DIAGNOSIS — F0150 Vascular dementia without behavioral disturbance: Secondary | ICD-10-CM

## 2020-04-11 DIAGNOSIS — Z789 Other specified health status: Secondary | ICD-10-CM | POA: Diagnosis not present

## 2020-04-11 DIAGNOSIS — I10 Essential (primary) hypertension: Secondary | ICD-10-CM | POA: Diagnosis not present

## 2020-04-11 DIAGNOSIS — G309 Alzheimer's disease, unspecified: Secondary | ICD-10-CM | POA: Diagnosis not present

## 2020-04-11 LAB — TYPE AND SCREEN
ABO/RH(D): AB POS
Antibody Screen: NEGATIVE

## 2020-04-11 LAB — PROTIME-INR
INR: 1.1 (ref 0.8–1.2)
Prothrombin Time: 13.7 seconds (ref 11.4–15.2)

## 2020-04-11 LAB — CBC WITH DIFFERENTIAL/PLATELET
Abs Immature Granulocytes: 0.02 10*3/uL (ref 0.00–0.07)
Basophils Absolute: 0 10*3/uL (ref 0.0–0.1)
Basophils Relative: 1 %
Eosinophils Absolute: 0 10*3/uL (ref 0.0–0.5)
Eosinophils Relative: 0 %
HCT: 37.5 % (ref 36.0–46.0)
Hemoglobin: 12.4 g/dL (ref 12.0–15.0)
Immature Granulocytes: 0 %
Lymphocytes Relative: 9 %
Lymphs Abs: 0.6 10*3/uL — ABNORMAL LOW (ref 0.7–4.0)
MCH: 30.2 pg (ref 26.0–34.0)
MCHC: 33.1 g/dL (ref 30.0–36.0)
MCV: 91.2 fL (ref 80.0–100.0)
Monocytes Absolute: 0.5 10*3/uL (ref 0.1–1.0)
Monocytes Relative: 7 %
Neutro Abs: 5.5 10*3/uL (ref 1.7–7.7)
Neutrophils Relative %: 83 %
Platelets: 352 10*3/uL (ref 150–400)
RBC: 4.11 MIL/uL (ref 3.87–5.11)
RDW: 14.6 % (ref 11.5–15.5)
WBC: 6.6 10*3/uL (ref 4.0–10.5)
nRBC: 0 % (ref 0.0–0.2)

## 2020-04-11 LAB — COMPREHENSIVE METABOLIC PANEL
ALT: 11 U/L (ref 0–44)
AST: 20 U/L (ref 15–41)
Albumin: 3.2 g/dL — ABNORMAL LOW (ref 3.5–5.0)
Alkaline Phosphatase: 62 U/L (ref 38–126)
Anion gap: 16 — ABNORMAL HIGH (ref 5–15)
BUN: 25 mg/dL — ABNORMAL HIGH (ref 8–23)
CO2: 22 mmol/L (ref 22–32)
Calcium: 9.9 mg/dL (ref 8.9–10.3)
Chloride: 106 mmol/L (ref 98–111)
Creatinine, Ser: 1.02 mg/dL — ABNORMAL HIGH (ref 0.44–1.00)
GFR, Estimated: 54 mL/min — ABNORMAL LOW (ref >60)
Glucose, Bld: 190 mg/dL — ABNORMAL HIGH (ref 70–99)
Potassium: 3.8 mmol/L (ref 3.5–5.1)
Sodium: 144 mmol/L (ref 135–145)
Total Bilirubin: 0.9 mg/dL (ref 0.3–1.2)
Total Protein: 7.6 g/dL (ref 6.5–8.1)

## 2020-04-11 LAB — LACTIC ACID, PLASMA
Lactic Acid, Venous: 1.5 mmol/L (ref 0.5–1.9)
Lactic Acid, Venous: 1.5 mmol/L (ref 0.5–1.9)

## 2020-04-11 LAB — RESP PANEL BY RT-PCR (FLU A&B, COVID) ARPGX2
Influenza A by PCR: NEGATIVE
Influenza B by PCR: NEGATIVE
SARS Coronavirus 2 by RT PCR: NEGATIVE

## 2020-04-11 LAB — TROPONIN I (HIGH SENSITIVITY)
Troponin I (High Sensitivity): 17 ng/L (ref <18)
Troponin I (High Sensitivity): 18 ng/L — ABNORMAL HIGH (ref <18)

## 2020-04-11 LAB — LIPASE, BLOOD: Lipase: 48 U/L (ref 11–51)

## 2020-04-11 MED ORDER — SODIUM CHLORIDE 0.9 % IV BOLUS
500.0000 mL | Freq: Once | INTRAVENOUS | Status: AC
  Administered 2020-04-11: 500 mL via INTRAVENOUS

## 2020-04-11 MED ORDER — SODIUM CHLORIDE 0.9 % IV SOLN: Freq: Once | INTRAVENOUS | Status: AC

## 2020-04-11 MED ORDER — APIXABAN 5 MG PO TABS
5.0000 mg | ORAL_TABLET | Freq: Two times a day (BID) | ORAL | Status: DC
  Administered 2020-04-11 – 2020-04-18 (×11): 5 mg via ORAL
  Filled 2020-04-11 (×13): qty 1

## 2020-04-11 MED ORDER — LORAZEPAM 2 MG/ML IJ SOLN
0.5000 mg | Freq: Once | INTRAMUSCULAR | Status: AC
  Administered 2020-04-11: 0.5 mg via INTRAVENOUS
  Filled 2020-04-11: qty 1

## 2020-04-11 MED ORDER — THIAMINE HCL 100 MG/ML IJ SOLN
INTRAVENOUS | Status: DC
  Filled 2020-04-11 (×3): qty 1000

## 2020-04-11 MED ORDER — ENSURE ENLIVE PO LIQD
237.0000 mL | Freq: Two times a day (BID) | ORAL | Status: DC
  Administered 2020-04-11: 237 mL via ORAL
  Filled 2020-04-11 (×2): qty 237

## 2020-04-11 MED ORDER — BACITRACIN-NEOMYCIN-POLYMYXIN OINTMENT TUBE
TOPICAL_OINTMENT | Freq: Two times a day (BID) | CUTANEOUS | Status: DC
  Filled 2020-04-11 (×2): qty 14

## 2020-04-11 MED ORDER — HYDRALAZINE HCL 20 MG/ML IJ SOLN: 5.0000 mg | Freq: Four times a day (QID) | INTRAMUSCULAR | Status: DC | PRN

## 2020-04-11 MED ORDER — ORAL CARE MOUTH RINSE: 15.0000 mL | Freq: Two times a day (BID) | OROMUCOSAL | Status: DC

## 2020-04-11 MED ORDER — ACETAMINOPHEN 500 MG PO TABS: 1000.0000 mg | ORAL_TABLET | Freq: Four times a day (QID) | ORAL | Status: DC | PRN

## 2020-04-11 MED ORDER — MEMANTINE HCL 10 MG PO TABS
10.0000 mg | ORAL_TABLET | Freq: Two times a day (BID) | ORAL | Status: DC
  Administered 2020-04-11 – 2020-04-18 (×11): 10 mg via ORAL
  Filled 2020-04-11 (×16): qty 1

## 2020-04-11 MED ORDER — DRONABINOL 2.5 MG PO CAPS
2.5000 mg | ORAL_CAPSULE | Freq: Two times a day (BID) | ORAL | Status: DC
  Administered 2020-04-12 – 2020-04-18 (×7): 2.5 mg via ORAL
  Filled 2020-04-11 (×7): qty 1

## 2020-04-11 NOTE — Telephone Encounter (Signed)
[facility] contacted Carolyn Bishop with [facility] stating pts decline & agreed to Hospice care   Need a referral for Hospice eval & treat.  Thanks, Misty Stanley

## 2020-04-11 NOTE — H&P (Signed)
History and Physical    Carolyn Bishop ZOX:[id number] DOB: [birth year] DOA: 04/11/2020  PCP: Sharon SellerEubanks, Jessica K, NP (Confirm with patient/family/NH records and if not entered, [facility]) Patient coming from: Home  I have personally briefly reviewed [facility]  Chief Complaint: Not eating or drinking  HPI: Carolyn FilbertRamona Bishop is a 86 y.o. female with medical history significant of advanced dementia, HTN, HLD, recent diagnosed right-sided DVT on Eliquis, presented with decreased oral intake and worsening of confusion.  Patient baseline demented and has significant confusion, unable to provide any history, and most of the history was provided by patient niece.  Patient niece has been her only caregiver, who reported that patient has had frequent confusions lately after last hospitalization about 4 weeks ago.  Patient's overall intake has decreased significantly in the last 2 weeks, but still able to drink her usual without problem, no feeling of nausea vomiting or diarrhea.  However since 3 days ago, patient stopped eating and drinking work together, and appears to be more sleepy throughout the day.  This morning, needs to try to move her and patient was yelling for pain but not able to specify location.  Since last admission, patient has had home care team following her at home.  And this week as per home care team evaluation, hospice was recommended, and it was scheduled the first hospice visit at home today, but given the quick deterioration patient condition, niece detective sent her to ER instead.  ED Course: Patient was found to be severely dehydrated, IV hydration started.  CT head negative for acute structural changes or bleeding.  UA pending.  Review of Systems: Unable to perform, patient confused  Past Medical History:  Diagnosis Date  . Abnormal gait    Onset 02/05/2015  . Arthritis   . Benign essential hypertension   .  Diabetes (HCC)   . Diabetes mellitus type II, controlled, with no complications (HCC)   . HLD (hyperlipidemia)   . HTN (hypertension)   . Obesity   . Palpitations   . Vitamin D deficiency     Past Surgical History:  Procedure Laterality Date  . ABDOMINAL HYSTERECTOMY    . BIOPSY  10/29/2017   Procedure: BIOPSY;  Surgeon: Kerin SalenKarki, Arya, MD;  Location: [facility];  Service: Gastroenterology;;  . ESOPHAGOGASTRODUODENOSCOPY (EGD) WITH PROPOFOL N/A 10/29/2017   Procedure: ESOPHAGOGASTRODUODENOSCOPY (EGD) WITH PROPOFOL;  Surgeon: Kerin SalenKarki, Arya, MD;  Location: [facility];  Service: Gastroenterology;  Laterality: N/A;     reports that she has never smoked. She has never used smokeless tobacco. She reports that she does not drink alcohol and does not use drugs.  No Known Allergies  Family History  Problem Relation Age of Onset  . Hypertension Mother 5932  . Lung cancer Father 6475       [employer]osis   . Heart Problems Sister   . Kidney failure Sister   . Diabetes Sister   . Congestive Heart Failure Sister        CHF    Prior to Admission medications   Medication Sig Start Date End Date Taking? Authorizing Provider  acetaminophen (TYLENOL) 500 MG tablet Take 1,000 mg by mouth every 6 (six) hours as needed for mild pain or headache.   Yes [provider]  amLODipine (NORVASC) 5 MG tablet Take 1 tablet (5 mg total) by mouth daily. 02/05/20 02/04/21 Yes Sharon SellerEubanks, Jessica K, NP  apixaban Everlene Balls(ELIQUIS)  5 MG TABS tablet Take 1 tablet (5 mg total) by mouth 2 (two) times daily. First dose on Mon 03/17/20 at 2200 03/17/20  Yes Leroy Sea, MD  feeding supplement (ENSURE ENLIVE / ENSURE PLUS) LIQD Take 237 mLs by mouth 2 (two) times daily between meals. 03/17/20 04/16/20 Yes Leroy Sea, MD  memantine (NAMENDA) 10 MG tablet Take 10 mg by mouth 2 (two) times daily.   Yes [provider]  cyanocobalamin (,VITAMIN B-12,) 1000 MCG/ML injection Inject 1 mL (1,000 mcg  total) into the skin every 30 (thirty) days. Patient not taking: Reported on 04/11/2020 03/17/20   Leroy Sea, MD  Multiple Vitamin (MULTIVITAMIN WITH MINERALS) TABS tablet Take 1 tablet by mouth daily. Patient not taking: Reported on 04/11/2020 03/17/20   Leroy Sea, MD    Physical Exam: Vitals:   04/11/20 1345 04/11/20 1400 04/11/20 1445 04/11/20 1515  BP: (!) 166/87 (!) 178/85 (!) 165/90 (!) 157/73  Pulse: 95 87 78 (!) 145  Resp:    16  Temp:      TempSrc:      SpO2: 100% 99% (!) 84% 94%    Constitutional: NAD, calm, comfortable Vitals:   04/11/20 1345 04/11/20 1400 04/11/20 1445 04/11/20 1515  BP: (!) 166/87 (!) 178/85 (!) 165/90 (!) 157/73  Pulse: 95 87 78 (!) 145  Resp:    16  Temp:      TempSrc:      SpO2: 100% 99% (!) 84% 94%   Eyes: PERRL, lids and conjunctivae normal ENMT: Mucous membranes are dry. Posterior pharynx clear of any exudate or lesions.Normal dentition.  Neck: normal, supple, no masses, no thyromegaly Respiratory: clear to auscultation bilaterally, no wheezing, no crackles. Normal respiratory effort. No accessory muscle use.  Cardiovascular: Regular rate and rhythm, no murmurs / rubs / gallops. 2+ extremity edema more on the right. 2+ pedal pulses. No carotid bruits.  Abdomen: no tenderness, no masses palpated. No hepatosplenomegaly. Bowel sounds positive.  Musculoskeletal: no clubbing / cyanosis. No joint deformity upper and lower extremities. Good ROM, no contractures. Normal muscle tone.  Skin: no rashes, lesions, ulcers. No induration Neurologic: Moving all limbs, muscle strength appears to be within normal limits, no facial droop Psychiatric: Confused and yawning, and lethargic    Labs on Admission: I have personally reviewed following labs and imaging studies  CBC: Recent Labs  Lab 04/11/20 1030  WBC 6.6  NEUTROABS 5.5  HGB 12.4  HCT 37.5  MCV 91.2  PLT 352   Basic Metabolic Panel: Recent Labs  Lab 04/11/20 1030  NA 144    K 3.8  CL 106  CO2 22  GLUCOSE 190*  BUN 25*  CREATININE 1.02*  CALCIUM 9.9   GFR: CrCl cannot be calculated (Unknown ideal weight.). Liver Function Tests: Recent Labs  Lab 04/11/20 1030  AST 20  ALT 11  ALKPHOS 62  BILITOT 0.9  PROT 7.6  ALBUMIN 3.2*   Recent Labs  Lab 04/11/20 1030  LIPASE 48   No results for input(s): AMMONIA in the last 168 hours. Coagulation Profile: Recent Labs  Lab 04/11/20 1030  INR 1.1   Cardiac Enzymes: No results for input(s): CKTOTAL, CKMB, CKMBINDEX, TROPONINI in the last 168 hours. BNP (last 3 results) No results for input(s): PROBNP in the last 8760 hours. HbA1C: No results for input(s): HGBA1C in the last 72 hours. CBG: No results for input(s): GLUCAP in the last 168 hours. Lipid Profile: No results for input(s): CHOL, HDL, LDLCALC,  TRIG, CHOLHDL, LDLDIRECT in the last 72 hours. Thyroid Function Tests: No results for input(s): TSH, T4TOTAL, FREET4, T3FREE, THYROIDAB in the last 72 hours. Anemia Panel: No results for input(s): VITAMINB12, FOLATE, FERRITIN, TIBC, IRON, RETICCTPCT in the last 72 hours. Urine analysis:    Component Value Date/Time   COLORURINE YELLOW 03/24/2020 1350   APPEARANCEUR HAZY (A) 03/24/2020 1350   LABSPEC 1.019 03/24/2020 1350   PHURINE 5.0 03/24/2020 1350   GLUCOSEU NEGATIVE 03/24/2020 1350   HGBUR NEGATIVE 03/24/2020 1350   BILIRUBINUR NEGATIVE 03/24/2020 1350   KETONESUR NEGATIVE 03/24/2020 1350   PROTEINUR 100 (A) 03/24/2020 1350   UROBILINOGEN 0.2 02/21/2015 2355   NITRITE NEGATIVE 03/24/2020 1350   LEUKOCYTESUR NEGATIVE 03/24/2020 1350    Radiological Exams on Admission: CT Head Wo Contrast  Result Date: 04/11/2020 CLINICAL DATA:  Delirium EXAM: CT HEAD WITHOUT CONTRAST TECHNIQUE: Contiguous axial images were obtained from the base of the skull through the vertex without intravenous contrast. COMPARISON:  03/24/2020 FINDINGS: Brain: There is no acute intracranial hemorrhage, mass effect,  or edema. Gray-white differentiation is preserved. There is no extra-axial fluid collection. Prominence of the ventricles and sulci reflects stable parenchymal volume loss. Patchy and confluent areas of hypoattenuation in the supratentorial white matter are nonspecific but probably reflect stable chronic microvascular ischemic changes. Vascular: There is atherosclerotic calcification at the skull base. Skull: Calvarium is unremarkable. Sinuses/Orbits: Chronic right sphenoid sinusitis. Other: None. IMPRESSION: No acute intracranial abnormality. Stable chronic/nonemergent findings detailed above. Electronically Signed   By: Guadlupe Spanish M.D.   On: 04/11/2020 12:26   DG Pelvis Portable  Result Date: 04/11/2020 CLINICAL DATA:  Altered mental status. EXAM: PORTABLE PELVIS 1-2 VIEWS COMPARISON:  Left hip series 12/23/2019. FINDINGS: Tubing noted over the abdomen and pelvis. Very large amount of stool noted throughout the colon rectum consistent with constipation and possible fecal impaction. Pelvic calcifications consistent phleboliths. Peripheral vascular calcification. Diffuse osteopenia. Degenerative changes scoliosis lumbar spine. Degenerative changes both hips. No acute bony abnormality identified. No evidence of fracture dislocation. IMPRESSION: 1. Very large amount of stool noted throughout the colon and rectum consistent with constipation and possible fecal impaction. 2. Diffuse osteopenia. Degenerative changes scoliosis lumbar spine. Degenerative changes both hips. No acute bony abnormality. 3.  Peripheral vascular disease. Electronically Signed   By: Maisie Fus  Register   On: 04/11/2020 11:33   DG Chest Port 1 View  Result Date: 04/11/2020 CLINICAL DATA:  ams EXAM: PORTABLE CHEST 1 VIEW COMPARISON:  03/10/2020 chest radiograph and prior. FINDINGS: No focal consolidation, pneumothorax or pleural effusion. Cardiomediastinal silhouette within normal limits. No acute osseous abnormality. IMPRESSION: No acute  airspace disease. Electronically Signed   By: Stana Bunting M.D.   On: 04/11/2020 11:30   DG Knee Left Port  Result Date: 04/11/2020 CLINICAL DATA:  Left knee bruising after fall. EXAM: PORTABLE LEFT KNEE - 1-2 VIEW COMPARISON:  None. FINDINGS: No evidence of fracture, dislocation, or joint effusion. Mild degenerative changes seen involving the medial and lateral joint spaces with osteophyte formation. Soft tissues are unremarkable. IMPRESSION: Mild degenerative joint disease. No acute abnormality seen in the left knee. Electronically Signed   By: Lupita Raider M.D.   On: 04/11/2020 11:34    EKG: None  Assessment/Plan Active Problems:   Dehydration  (please populate well all problems here in Problem List. (For example, if patient is on BP meds at home and you resume or decide to hold them, it is a problem that needs to be her. Same for CAD,  COPD, HLD and so on)  Failure to thrive -No organic etiology identified for the reason patient stopped eating and drinking.  Further study unlikely will trend clinical course or even improve patient quality of life or prolong her life, given the already overall poor functional status and mentation.  Discussed with patient niece, patient does not have a living will and niece is the next kin, who already agreed with hospice earlier this week and now confirmed that she does not desire pt to have a feeding tube and maintained that if pt's condition not improving with current management, she agrees with hospice. Will consult palliative care -Continue Ensure, remove diet restrictions, consult dietitian. -Trial of Marinol -IVF with banana bag to correct dehydration  Right-sided DVT -Continue Eliquis for now  HTN -Discontinue amlodipine -Start as needed hydralazine  Advanced dementia -As above  DVT prophylaxis: Eliquis Code Status: DNR Family Communication: Niece over the phone Disposition Plan: Patient sick, expect more than 2 midnight hospital  stay versus admit/discharge to hospice. Consults called: Palliative care Admission status: MedSurg admission   Emeline General MD [facility] Pager [phone]  04/11/2020, 3:31 PM

## 2020-04-11 NOTE — ED Notes (Signed)
Pt transported to CT ?

## 2020-04-11 NOTE — ED Notes (Signed)
Pt becoming increasingly agitated and confused, yelling, This nurse notified EDP.

## 2020-04-11 NOTE — ED Provider Notes (Signed)
[facility] EMERGENCY DEPARTMENT Provider Note   CSN: [id number] Arrival date & time: 04/11/20  1034     History Chief Complaint  Patient presents with  . Weakness  . Failure To Thrive    Carolyn Bishop is a 86 y.o. female.  86 year old female with prior medical history as detailed below presents for evaluation.  Patient is transported by EMS from a private care residence.  Patient is DNR.  Patient with prior history significant for dementia.  Level 5 caveat secondary to same.  Patient is more confused than her baseline per EMS.  Patient with decreased p.o. intake over the last 2 to 3 days with increased confusion per report.  Patient is alert to her person.  She denies any specific complaint.  The history is provided by the patient, the EMS personnel and medical records.  Altered Mental Status Presenting symptoms: disorientation   Severity:  Moderate Most recent episode:  2 days ago Episode history:  Continuous Duration:  2 days Timing:  Constant Progression:  Worsening Chronicity:  New      Past Medical History:  Diagnosis Date  . Abnormal gait    Onset 02/05/2015  . Arthritis   . Benign essential hypertension   . Diabetes (HCC)   . Diabetes mellitus type II, controlled, with no complications (HCC)   . HLD (hyperlipidemia)   . HTN (hypertension)   . Obesity   . Palpitations   . Vitamin D deficiency     Patient Active Problem List   Diagnosis Date Noted  . Palliative care by specialist   . Goals of care, counseling/discussion   . UTI (urinary tract infection) 03/11/2020  . DM (diabetes mellitus), type 2 (HCC) 03/11/2020  . Debility 03/11/2020  . DVT (deep venous thrombosis) (HCC) 03/11/2020  . Chronic diastolic CHF (congestive heart failure) (HCC) 03/11/2020  . Pressure injury of skin 03/10/2020  . Malnutrition of moderate degree 12/22/2019  . Fall at home 12/20/2019  . Acute encephalopathy 10/28/2019  . Grade I diastolic dysfunction  10/28/2019  . HLD (hyperlipidemia) 10/28/2017  . Acute urinary retention   . AKI (acute kidney injury) (HCC) 10/27/2017  . Hypotension 10/27/2017  . Prediabetes 10/27/2017  . Essential hypertension 10/27/2017  . Hypoglycemia 10/27/2017  . Normocytic anemia 10/27/2017  . GIB (gastrointestinal bleeding) 10/27/2017  . Lung mass 10/27/2017  . Lactic acid acidosis 10/27/2017  . Left arm weakness 10/27/2017    Past Surgical History:  Procedure Laterality Date  . ABDOMINAL HYSTERECTOMY    . BIOPSY  10/29/2017   Procedure: BIOPSY;  Surgeon: Kerin Salen, MD;  Location: [facility];  Service: Gastroenterology;;  . ESOPHAGOGASTRODUODENOSCOPY (EGD) WITH PROPOFOL N/A 10/29/2017   Procedure: ESOPHAGOGASTRODUODENOSCOPY (EGD) WITH PROPOFOL;  Surgeon: Kerin Salen, MD;  Location: [facility];  Service: Gastroenterology;  Laterality: N/A;     OB History   No obstetric history on file.     Family History  Problem Relation Age of Onset  . Hypertension Mother 62  . Lung cancer Father 70       [employer]   . Heart Problems Sister   . Kidney failure Sister   . Diabetes Sister   . Congestive Heart Failure Sister        CHF    Social History   Tobacco Use  . Smoking status: Never Smoker  . Smokeless tobacco: Never Used  Substance Use Topics  . Alcohol use: No    Alcohol/week: 0.0 standard drinks  . Drug use: No  Home Medications Prior to Admission medications   Medication Sig Start Date End Date Taking? Authorizing Provider  acetaminophen (TYLENOL) 500 MG tablet Take 1,000 mg by mouth every 6 (six) hours as needed for mild pain or headache.   Yes [provider]  amLODipine (NORVASC) 5 MG tablet Take 1 tablet (5 mg total) by mouth daily. 02/05/20 02/04/21 Yes Sharon SellerEubanks, Jessica K, NP  apixaban (ELIQUIS) 5 MG TABS tablet Take 1 tablet (5 mg total) by mouth 2 (two) times daily. First dose on Mon 03/17/20 at 2200 03/17/20  Yes Leroy SeaSingh, Prashant K, MD  feeding  supplement (ENSURE ENLIVE / ENSURE PLUS) LIQD Take 237 mLs by mouth 2 (two) times daily between meals. 03/17/20 04/16/20 Yes Leroy SeaSingh, Prashant K, MD  memantine (NAMENDA) 10 MG tablet Take 10 mg by mouth 2 (two) times daily.   Yes [provider]  cyanocobalamin (,VITAMIN B-12,) 1000 MCG/ML injection Inject 1 mL (1,000 mcg total) into the skin every 30 (thirty) days. Patient not taking: Reported on 04/11/2020 03/17/20   Leroy SeaSingh, Prashant K, MD  Multiple Vitamin (MULTIVITAMIN WITH MINERALS) TABS tablet Take 1 tablet by mouth daily. Patient not taking: Reported on 04/11/2020 03/17/20   Leroy SeaSingh, Prashant K, MD    Allergies    Patient has no known allergies.  Review of Systems   Review of Systems  Unable to perform ROS: Dementia    Physical Exam Updated Vital Signs BP (!) 147/69   Pulse 76   Temp 97.6 F (36.4 C) (Oral)   Resp 17   SpO2 95%   Physical Exam Vitals and nursing note reviewed.  Constitutional:      General: She is not in acute distress.    Appearance: Normal appearance. She is well-developed.  HENT:     Head: Normocephalic and atraumatic.  Eyes:     Conjunctiva/sclera: Conjunctivae normal.     Pupils: Pupils are equal, round, and reactive to light.  Cardiovascular:     Rate and Rhythm: Normal rate and regular rhythm.     Heart sounds: Normal heart sounds.  Pulmonary:     Effort: Pulmonary effort is normal. No respiratory distress.     Breath sounds: Normal breath sounds.  Abdominal:     General: There is no distension.     Palpations: Abdomen is soft.     Tenderness: There is no abdominal tenderness.  Musculoskeletal:        General: No deformity. Normal range of motion.     Cervical back: Normal range of motion and neck supple.  Skin:    General: Skin is warm and dry.  Neurological:     Mental Status: She is alert and oriented to person, place, and time.     ED Results / Procedures / Treatments   Labs (all labs ordered are listed, but only abnormal  results are displayed) Labs Reviewed  CBC WITH DIFFERENTIAL/PLATELET - Abnormal; Notable for the following components:      Result Value   Lymphs Abs 0.6 (*)    All other components within normal limits  COMPREHENSIVE METABOLIC PANEL - Abnormal; Notable for the following components:   Glucose, Bld 190 (*)    BUN 25 (*)    Creatinine, Ser 1.02 (*)    Albumin 3.2 (*)    GFR, Estimated 54 (*)    Anion gap 16 (*)    All other components within normal limits  TROPONIN I (HIGH SENSITIVITY) - Abnormal; Notable for the following components:   Troponin I (High  Sensitivity) 18 (*)    All other components within normal limits  RESP PANEL BY RT-PCR (FLU A&B, COVID) ARPGX2  CULTURE, BLOOD (ROUTINE X 2)  CULTURE, BLOOD (ROUTINE X 2)  LACTIC ACID, PLASMA  LACTIC ACID, PLASMA  LIPASE, BLOOD  PROTIME-INR  URINALYSIS, ROUTINE W REFLEX MICROSCOPIC  TYPE AND SCREEN  TROPONIN I (HIGH SENSITIVITY)    EKG None  Radiology DG Pelvis Portable  Result Date: 04/11/2020 CLINICAL DATA:  Altered mental status. EXAM: PORTABLE PELVIS 1-2 VIEWS COMPARISON:  Left hip series 12/23/2019. FINDINGS: Tubing noted over the abdomen and pelvis. Very large amount of stool noted throughout the colon rectum consistent with constipation and possible fecal impaction. Pelvic calcifications consistent phleboliths. Peripheral vascular calcification. Diffuse osteopenia. Degenerative changes scoliosis lumbar spine. Degenerative changes both hips. No acute bony abnormality identified. No evidence of fracture dislocation. IMPRESSION: 1. Very large amount of stool noted throughout the colon and rectum consistent with constipation and possible fecal impaction. 2. Diffuse osteopenia. Degenerative changes scoliosis lumbar spine. Degenerative changes both hips. No acute bony abnormality. 3.  Peripheral vascular disease. Electronically Signed   By: Maisie Fus  Register   On: 04/11/2020 11:33   DG Chest Port 1 View  Result Date:  04/11/2020 CLINICAL DATA:  ams EXAM: PORTABLE CHEST 1 VIEW COMPARISON:  03/10/2020 chest radiograph and prior. FINDINGS: No focal consolidation, pneumothorax or pleural effusion. Cardiomediastinal silhouette within normal limits. No acute osseous abnormality. IMPRESSION: No acute airspace disease. Electronically Signed   By: Stana Bunting M.D.   On: 04/11/2020 11:30   DG Knee Left Port  Result Date: 04/11/2020 CLINICAL DATA:  Left knee bruising after fall. EXAM: PORTABLE LEFT KNEE - 1-2 VIEW COMPARISON:  None. FINDINGS: No evidence of fracture, dislocation, or joint effusion. Mild degenerative changes seen involving the medial and lateral joint spaces with osteophyte formation. Soft tissues are unremarkable. IMPRESSION: Mild degenerative joint disease. No acute abnormality seen in the left knee. Electronically Signed   By: Lupita Raider M.D.   On: 04/11/2020 11:34    Procedures Procedures (including critical care time)  Medications Ordered in ED Medications  sodium chloride 0.9 % bolus 500 mL (500 mLs Intravenous New Bag/Given 04/11/20 1117)    ED Course  I have reviewed the triage vital signs and the nursing notes.  Pertinent labs & imaging results that were available during my care of the patient were reviewed by me and considered in my medical decision making (see chart for details).    MDM Rules/Calculators/A&P                          MDM  Screen complete  Viktorya Brucker was evaluated in Emergency Department on 04/11/2020 for the symptoms described in the history of present illness. She was evaluated in the context of the global COVID-19 pandemic, which necessitated consideration that the patient might be at risk for infection with the SARS-CoV-2 virus that causes COVID-19. Institutional protocols and algorithms that pertain to the evaluation of patients at risk for COVID-19 are in a state of rapid change based on information released by regulatory bodies including the CDC and  federal and state organizations. These policies and algorithms were followed during the patient's care in the ED.  Attending for evaluation of reported failure to thrive and decreased p.o. intake.  Exam is suggestive of dehydration.  This is supported with left heart findings.  Patient would likely improve with admission for IV fluids.  Patient's case discussed with  hospitalist service Dr. Chipper Herb.  Patient will be seen for likely admission.  Patient's family, her niece, is aware of plan of care and agrees with plan for admission.   Final Clinical Impression(s) / ED Diagnoses Final diagnoses:  Dehydration  Dementia without behavioral disturbance, unspecified dementia type [facility])    Rx / DC Orders ED Discharge Orders    None       Wynetta Fines, MD 04/11/20 1437

## 2020-04-11 NOTE — Consult Note (Signed)
Consultation Note Date: 04/11/2020   Patient Name: Carolyn Bishop  DOB: October 25, 1932  MRN: [id number]  Age / Sex: 86 y.o., female  PCP: Lauree Chandler, NP Referring Physician: Lequita Halt, MD  Reason for Consultation: Establishing goals of care  HPI/Patient Profile: 86 y.o. female  with past medical history of advanced dementia, HTN, recently diagnosed right-sided DVT on eliquis presented to the ED on 04/11/20 for decreased PO intake x several days as well as increased weakness and confusion.   ED Course: Patient was found to be severely dehydrated, IV hydration started.  CT head negative for acute structural changes or bleeding.  UA pending.  Patient was seen by PMT during last hospitalization from 11/1-11/8/21 for UTI. She was seen in the ED on 03/24/20 for fall encounter.   Family face treatment option decisions, advanced directive decisions, and anticipatory care needs.  Clinical Assessment and Goals of Care: I have reviewed medical records including EPIC notes, labs, and imaging. Received report from primary RN - no acute concerns. RN states patient is not alert enough to take oral medications or to follow commands.  Went to visit patient at bedside - no family/visitors present. Patient was lying in bed - she did not wake to voice/gentle touch, not able to participate in conversation. No signs or non-verbal gestures of pain or discomfort noted. No respiratory distress, increased work of breathing, or secretions noted.   Met with patient's niece/Mellanie via phone to discuss diagnosis, prognosis, GOC, EOL wishes, disposition, and options.  I introduced Palliative Medicine as specialized medical care for people living with serious illness. It focuses on providing relief from the symptoms and stress of a serious illness. The goal is to improve quality of life for both the patient and the family.  We  discussed a brief life review of the patient as well as functional and nutritional status. The patient is divorced and does not have children. For the last year, the patient has lived in a private residence with her two sisters, aged 9s and 85s, who both have chronic health issues. For this reason, Mellanie has been the main contact and decision maker for the patient. Mellanie reports she has noticed a decline in the patient's physical and mental status over the last year. Prior to a month ago, the patient was able to ambulate with a walker. As of one month ago, the patient hasn't been able to walk - she is now bedbound. As of two weeks ago, the patient has had a decreased oral intake, has not been interested in eating/drinking. The patient needs assistance with all ADLs - she has a care team supporting her at home that includes SW, RN, PT, Speech Therapy, and home health aid 1 day per week. Mellanie states that the patient has not been finding Speech or PT beneficial over the last month or so. Mellanie tells me that it has been increasingly harder and harder for the family to care for the patient at home - they have been trying to find placement  in a LTC facility as they feel they cannot safely provide care the patient needs. They recognize she needs 24/7 supervision and assistance.   We discussed patient's current illness and what it means in the larger context of patient's on-going co-morbidities. Mellanie has a clear understanding of the patient's overall current medical condition. She understands that dementia is a progressive, non-curable disease underlying the patient's current acute medical conditions. Natural disease trajectory and expectations at EOL were discussed, which includes decreased oral intake and increased sleeping. I attempted to elicit values and goals of care important to the patient. The difference between aggressive medical intervention and comfort care was considered in light of the  patient's goals of care. Mellanie tells me she does not want to pursue PEG tube - she wants to focus on the patient's quality of life and provide comfort and dignity free from suffering. Apparently, the patient was being referred to [facility] for home hospice services as of today; however, when [facility] called to set up services today, EMS was already at the patient's house bringing her to the hospital. Hospice philosophy and education provided on home hospice vs residential hospice. Due to the patient's need for 24/7 supervision/care, Mellanie is agreeable to residential hospice. [facility] does not offer residential hospice services - family will need to choose another organization to support discharge to hospice facility. Mellanie would like to continue current medical treatment for now until she can speak with the patient's 2 sisters tomorrow. She states they will likely move forward with initiating comfort care in house, but she wanted to speak with them before making any final decisions.  Visit also consisted of discussions dealing with the complex and emotionally intense issues of symptom management and palliative care in the setting of serious and potentially life-threatening illness. Palliative care team will continue to support patient, patient's family, and medical team.  Discussed with family the importance of continued conversation with each other and the medical providers regarding overall plan of care and treatment options, ensuring decisions are within the context of the patient's values and GOCs.    Questions and concerns were addressed. The patient/family was encouraged to call with questions and/or concerns. PMT number was provided.    Primary Decision Maker: NEXT OF KIN  Next of kin are patient's 2 elderly sisters, who also have chronic health issues. They have deferred decision making to patient's niece Mellanie Hassell Done. Mellanie does consult patient's sister's for shared-decision  making.    SUMMARY OF RECOMMENDATIONS:  Continue current medical treatment  Continue DNR/DNI as previously documented  Niece is going to speak with patient's two sisters about transitioning to comfort care in house tomorrow 12/3; she will be calling PMT tomorrow 12/3 with a  decision around lunch time - they likely will move forward with comfort care   Niece wants patient transferred to residential hospice facility. [facility] does not offer this service - family will need to chose another facility; [facility] consult placed  Family does not want to pursue aggressive interventions such as feeding tube  Please see MOST form in [place] for advanced directive outlining medical boundaries  PMT will continue to follow holistically   Code Status/Advance Care Planning:  DNR  Palliative Prophylaxis:   Aspiration, Bowel Regimen, Delirium Protocol, Frequent Pain Assessment, Oral Care and Turn Reposition  Additional Recommendations (Limitations, Scope, Preferences):  Full Scope Treatment  Psycho-social/Spiritual:   Desire for further Chaplaincy support:no Created space and opportunity for patient and family to express thoughts and feelings regarding patient's current  medical situation.   Emotional support and therapeutic listening provided.  Prognosis:   < 2 weeks  Discharge Planning: Hospice facility      Primary Diagnoses: Present on Admission: . Dehydration   I have reviewed the medical record, interviewed the patient and family, and examined the patient. The following aspects are pertinent.  Past Medical History:  Diagnosis Date  . Abnormal gait    Onset 02/05/2015  . Arthritis   . Benign essential hypertension   . Diabetes ([place])   . Diabetes mellitus type II, controlled, with no complications ([place])   . HLD (hyperlipidemia)   . HTN (hypertension)   . Obesity   . Palpitations   . Vitamin D deficiency    Social History   Socioeconomic History  . Marital status:  Divorced    Spouse name: Not on file  . Number of children: 0  . Years of education: Not on file  . Highest education level: Not on file  Occupational History  . Not on file  Tobacco Use  . Smoking status: Never Smoker  . Smokeless tobacco: Never Used  Substance and Sexual Activity  . Alcohol use: No    Alcohol/week: 0.0 standard drinks  . Drug use: No  . Sexual activity: Not Currently  Other Topics Concern  . Not on file  Social History Narrative   Diet: Tries to eat healthy       Do you drink/ eat things with caffeine? Yes Coffee      Marital status:                               What year were you married ?       Do you live in a house, apartment,assistred living, condo, trailer, etc.)       Is it one or more stories?       How many persons live in your home ?  2 Lives with Sister      Do you have any pets in your home ?(please list) No      Highest Level of education completed: Some College       Current or past profession:       Do you exercise?  Yes                            Type & how often  House work and walks in stores 3 to 4 times a month      ADVANCED DIRECTIVES (Please bring copies)      Do you have a living will?       Do you have a DNR form? Yes                      If not, do you want to discuss one?       Do you have signed POA?HPOA forms?                 If so, please bring to your appointment      FUNCTIONAL STATUS- To be completed by Spouse / child / Staff       Do you have difficulty bathing or dressing yourself ? Yes      Do you have difficulty preparing food or eating ?      Do you have difficulty managing your mediation ?      Do  you have difficulty managing your finances ?      Do you have difficulty affording your medication ?            Abstracted By Marisa Cyphers RMA      Diet:  Left Blank      Do you drink/ eat things with caffeine? No      Marital status:   D                            What year were you married ?        Do you live in a house, apartment,assistred living, condo, trailer, etc.)? House      Is it one or more stories? No      How many persons live in your home ? 3      Do you have any pets in your home ?(please list) No      Highest Level of education completed: College      Current or past profession: Left Blank      Do you exercise?    No                          Type & how often       ADVANCED DIRECTIVES (Please bring copies)      Do you have a living will? No      Do you have a DNR form?  No                     If not, do you want to discuss one?       Do you have signed POA?HPOA forms?  No               If so, please bring to your appointment      FUNCTIONAL STATUS- To be completed by Spouse / child / Staff       Do you have difficulty bathing or dressing yourself ?  Yes      Do you have difficulty preparing food or eating ? Yes      Do you have difficulty managing your mediation ? Yes      Do you have difficulty managing your finances ? Yes      Do you have difficulty affording your medication ? No         Social Determinants of Health   Financial Resource Strain:   . Difficulty of Paying Living Expenses: Not on file  Food Insecurity:   . Worried About Charity fundraiser in the Last Year: Not on file  . Ran Out of Food in the Last Year: Not on file  Transportation Needs:   . Lack of Transportation (Medical): Not on file  . Lack of Transportation (Non-Medical): Not on file  Physical Activity:   . Days of Exercise per Week: Not on file  . Minutes of Exercise per Session: Not on file  Stress:   . Feeling of Stress : Not on file  Social Connections:   . Frequency of Communication with Friends and Family: Not on file  . Frequency of Social Gatherings with Friends and Family: Not on file  . Attends Religious Services: Not on file  . Active Member of Clubs or Organizations: Not on file  . Attends Archivist Meetings: Not on file  . Marital Status: Not  on file  Family History  Problem Relation Age of Onset  . Hypertension Mother 15  . Lung cancer Father 53       [employer]   . Heart Problems Sister   . Kidney failure Sister   . Diabetes Sister   . Congestive Heart Failure Sister        CHF   Scheduled Meds: . apixaban  5 mg Oral BID  . dronabinol  2.5 mg Oral BID AC  . feeding supplement  237 mL Oral BID BM  . memantine  10 mg Oral BID  . neomycin-bacitracin-polymyxin   Topical BID   Continuous Infusions: . banana bag IV 1000 mL 100 mL/hr at 04/11/20 1707   PRN Meds:.acetaminophen, hydrALAZINE Medications Prior to Admission:  Prior to Admission medications   Medication Sig Start Date End Date Taking? Authorizing Provider  acetaminophen (TYLENOL) 500 MG tablet Take 1,000 mg by mouth every 6 (six) hours as needed for mild pain or headache.   Yes [provider]  amLODipine (NORVASC) 5 MG tablet Take 1 tablet (5 mg total) by mouth daily. 02/05/20 02/04/21 Yes Lauree Chandler, NP  apixaban (ELIQUIS) 5 MG TABS tablet Take 1 tablet (5 mg total) by mouth 2 (two) times daily. First dose on Mon 03/17/20 at 2200 03/17/20  Yes Thurnell Lose, MD  feeding supplement (ENSURE ENLIVE / ENSURE PLUS) LIQD Take 237 mLs by mouth 2 (two) times daily between meals. 03/17/20 04/16/20 Yes Thurnell Lose, MD  memantine (NAMENDA) 10 MG tablet Take 10 mg by mouth 2 (two) times daily.   Yes [provider]  cyanocobalamin (,VITAMIN B-12,) 1000 MCG/ML injection Inject 1 mL (1,000 mcg total) into the skin every 30 (thirty) days. Patient not taking: Reported on 04/11/2020 03/17/20   Thurnell Lose, MD  Multiple Vitamin (MULTIVITAMIN WITH MINERALS) TABS tablet Take 1 tablet by mouth daily. Patient not taking: Reported on 04/11/2020 03/17/20   Thurnell Lose, MD   No Known Allergies Review of Systems  Unable to perform ROS: Acuity of condition    Physical Exam Vitals and nursing note reviewed.  Constitutional:        General: She is not in acute distress.    Appearance: She is ill-appearing.     Comments: Frail appearing  Pulmonary:     Effort: No respiratory distress.  Skin:    General: Skin is warm and dry.  Neurological:     Mental Status: She is lethargic.     Motor: Weakness present.  Psychiatric:        Speech: She is noncommunicative.     Vital Signs: BP (!) 116/59   Pulse 80   Temp 97.6 F (36.4 C) (Oral)   Resp 16   SpO2 98%  Pain Scale: PAINAD       SpO2: SpO2: 98 % O2 Device:SpO2: 98 % O2 Flow Rate: .   IO: Intake/output summary: No intake or output data in the 24 hours ending 04/11/20 1852  LBM: Last BM Date: 04/11/20 Baseline Weight:   Most recent weight:       Palliative Assessment/Data: PPS 10%     Time In: 1730 Time Out: 1843 Time Total: 73 minutes  Greater than 50%  of this time was spent counseling and coordinating care related to the above assessment and plan.  Signed by: Lin Landsman, NP   Please contact Palliative Medicine Team phone at [phone] for questions and concerns.  For individual provider: See Shea Evans

## 2020-04-11 NOTE — Telephone Encounter (Signed)
Referral to hospice pended for provider to complete if in agreement with request or to give additional advice/recommendations  Awaiting for provider to weigh in

## 2020-04-11 NOTE — ED Triage Notes (Signed)
Pt to ED via EMS from a home that provides private care. Apparently pt increased weakness and pain over the past several days. Apparently pt has not been eating and drinking over the past couple days. Failure to thrive. Hx dementia , orientation at baseline  A&O X 1. Pt bed bound.  Last vs: 142/84, HR 102, CBG 229. rr 15. Temp 97.7 . No medications given by EMS . DNR at bedside.

## 2020-04-12 MED ORDER — HALOPERIDOL LACTATE 5 MG/ML IJ SOLN
2.0000 mg | Freq: Four times a day (QID) | INTRAMUSCULAR | Status: DC | PRN
  Administered 2020-04-16 – 2020-04-17 (×2): 2 mg via INTRAVENOUS
  Filled 2020-04-12 (×2): qty 1

## 2020-04-12 MED ORDER — BIOTENE DRY MOUTH MT LIQD
15.0000 mL | Freq: Two times a day (BID) | OROMUCOSAL | Status: DC
  Administered 2020-04-12 – 2020-04-18 (×12): 15 mL via TOPICAL

## 2020-04-12 MED ORDER — ONDANSETRON HCL 4 MG/2ML IJ SOLN: 4.0000 mg | Freq: Four times a day (QID) | INTRAMUSCULAR | Status: DC | PRN

## 2020-04-12 MED ORDER — GLYCOPYRROLATE 0.2 MG/ML IJ SOLN: 0.2000 mg | INTRAMUSCULAR | Status: DC | PRN

## 2020-04-12 MED ORDER — LORAZEPAM 2 MG/ML IJ SOLN
1.0000 mg | INTRAMUSCULAR | Status: DC | PRN
  Administered 2020-04-15 – 2020-04-16 (×2): 1 mg via INTRAVENOUS
  Filled 2020-04-12 (×2): qty 1

## 2020-04-12 MED ORDER — ACETAMINOPHEN 650 MG RE SUPP: 650.0000 mg | Freq: Four times a day (QID) | RECTAL | Status: DC | PRN

## 2020-04-12 MED ORDER — ACETAMINOPHEN 325 MG PO TABS
650.0000 mg | ORAL_TABLET | Freq: Four times a day (QID) | ORAL | Status: DC | PRN
  Administered 2020-04-15: 650 mg via ORAL
  Filled 2020-04-12: qty 2

## 2020-04-12 MED ORDER — ONDANSETRON 4 MG PO TBDP: 4.0000 mg | ORAL_TABLET | Freq: Four times a day (QID) | ORAL | Status: DC | PRN

## 2020-04-12 MED ORDER — VITAMIN B-12 100 MCG PO TABS: 100.0000 ug | ORAL_TABLET | Freq: Every day | ORAL | Status: DC

## 2020-04-12 MED ORDER — THIAMINE HCL 100 MG/ML IJ SOLN
INTRAVENOUS | Status: DC
  Filled 2020-04-12: qty 1000

## 2020-04-12 MED ORDER — MORPHINE SULFATE (PF) 2 MG/ML IV SOLN
1.0000 mg | INTRAVENOUS | Status: DC | PRN
  Administered 2020-04-16 (×2): 2 mg via INTRAVENOUS
  Filled 2020-04-12 (×2): qty 1

## 2020-04-12 MED ORDER — GLYCOPYRROLATE 1 MG PO TABS
1.0000 mg | ORAL_TABLET | ORAL | Status: DC | PRN
  Filled 2020-04-12: qty 1

## 2020-04-12 MED ORDER — POLYVINYL ALCOHOL 1.4 % OP SOLN
1.0000 [drp] | Freq: Four times a day (QID) | OPHTHALMIC | Status: DC | PRN
  Administered 2020-04-17: 1 [drp] via OPHTHALMIC
  Filled 2020-04-12: qty 15

## 2020-04-12 NOTE — Progress Notes (Signed)
PROGRESS NOTE    Carolyn FilbertRamona Bishop  ZOX:[id number] DOB: 19-Jan-1933 DOA: 04/11/2020 PCP: Sharon SellerEubanks, Jessica K, NP   Brief Narrative: 86 year old with past medical history significant for advanced dementia, hypertension, hyperlipidemia, recent diagnosis of right thigh DVT on Eliquis presenting with decreased oral intake, worsening confusion.  Patient baseline has dementia and significant confusion unable to provide any history.  Patient niece has been her only caregiver, who report patient has had frequent confusion, since her last hospitalization 4 weeks ago.  Her oral intake has decreased significantly over the last 2 weeks.  Over the last 3 days patient has stopped eating and drinking altogether and has been more sleepy throughout the day.  Palliative care was consulted for goals of care, family to make decision in regards to transitioning to comfort care.   Assessment & Plan:   Active Problems:   Dehydration   1-FTT;  Likely related to further decline of her dementia. Palliative care following, family to make decision in regards to transition to comfort care. Continue with Ensure, trial of Marinol. Continue with IV fluids for now. Blood Cultures no growth today. CT head no acute intracranial abnormality  2-Right side DVT: Continue with Eliquis  3-Hypertension: hold amlodipine.  Continue with IV hydralazine as needed. 4-advanced dementia: Supportive care    Pressure Injury 03/10/20 Sacrum Medial Stage 2 -  Partial thickness loss of dermis presenting as a shallow open injury with a red, pink wound bed without slough. (Active)  03/10/20 1734  Location: Sacrum  Location Orientation: Medial  Staging: Stage 2 -  Partial thickness loss of dermis presenting as a shallow open injury with a red, pink wound bed without slough.  Wound Description (Comments):   Present on Admission: Yes    Estimated body mass index is 24.09 kg/m as calculated from the following:   Height as of 03/10/20: 5'  3" (1.6 m).   Weight as of this encounter: 61.7 kg.   DVT prophylaxis: Eliquis   Code Status: DNR Family Communication: Disposition Plan:  Status is: Inpatient  Remains inpatient appropriate because:IV treatments appropriate due to intensity of illness or inability to take PO   Dispo: The patient is from: Home              Anticipated d/c is to: to be determine              Anticipated d/c date is: 1 day              Patient currently is not medically stable to d/c.  Family to make decision in regards to transition to comfort care        Consultants:   Palliative care  Procedures:   none  Antimicrobials:    Subjective: patient would open eyes to voice, fall back to sleep.    Objective: Vitals:   04/11/20 1600 04/11/20 2104 04/12/20 0501 04/12/20 0919  BP: (!) 116/59 (!) 166/90 (!) 176/76 (!) 156/78  Pulse: 80 93 83 88  Resp:  15 16 16   Temp:  98.2 F (36.8 C) 98.2 F (36.8 C) 98 F (36.7 C)  TempSrc:    Oral  SpO2: 98% 100% 100% 100%  Weight:  61.7 kg      Intake/Output Summary (Last 24 hours) at 04/12/2020 1259 Last data filed at 04/12/2020 0825 Gross per 24 hour  Intake 1748.33 ml  Output 0 ml  Net 1748.33 ml   Filed Weights   04/11/20 2104  Weight: 61.7 kg    Examination:  General exam: Appears calm and comfortable  Respiratory system: Clear to auscultation. Respiratory effort normal. Cardiovascular system: S1 & S2 heard, RRR. No JVD, murmurs, rubs, gallops or clicks. No pedal edema. Gastrointestinal system: Abdomen is nondistended, soft and nontender. No organomegaly or masses felt. Normal bowel sounds heard. Central nervous system: sleepy  Extremities: no edema  Data Reviewed: I have personally reviewed following labs and imaging studies  CBC: Recent Labs  Lab 04/11/20 1030  WBC 6.6  NEUTROABS 5.5  HGB 12.4  HCT 37.5  MCV 91.2  PLT 352   Basic Metabolic Panel: Recent Labs  Lab 04/11/20 1030  NA 144  K 3.8  CL 106  CO2  22  GLUCOSE 190*  BUN 25*  CREATININE 1.02*  CALCIUM 9.9   GFR: Estimated Creatinine Clearance: 33.4 mL/min (A) (by C-G formula based on SCr of 1.02 mg/dL (H)). Liver Function Tests: Recent Labs  Lab 04/11/20 1030  AST 20  ALT 11  ALKPHOS 62  BILITOT 0.9  PROT 7.6  ALBUMIN 3.2*   Recent Labs  Lab 04/11/20 1030  LIPASE 48   No results for input(s): AMMONIA in the last 168 hours. Coagulation Profile: Recent Labs  Lab 04/11/20 1030  INR 1.1   Cardiac Enzymes: No results for input(s): CKTOTAL, CKMB, CKMBINDEX, TROPONINI in the last 168 hours. BNP (last 3 results) No results for input(s): PROBNP in the last 8760 hours. HbA1C: No results for input(s): HGBA1C in the last 72 hours. CBG: No results for input(s): GLUCAP in the last 168 hours. Lipid Profile: No results for input(s): CHOL, HDL, LDLCALC, TRIG, CHOLHDL, LDLDIRECT in the last 72 hours. Thyroid Function Tests: No results for input(s): TSH, T4TOTAL, FREET4, T3FREE, THYROIDAB in the last 72 hours. Anemia Panel: No results for input(s): VITAMINB12, FOLATE, FERRITIN, TIBC, IRON, RETICCTPCT in the last 72 hours. Sepsis Labs: Recent Labs  Lab 04/11/20 1030 04/11/20 1244  LATICACIDVEN 1.5 1.5    Recent Results (from the past 240 hour(s))  Resp Panel by RT-PCR (Flu A&B, Covid) Nasopharyngeal Swab     Status: None   Collection Time: 04/11/20 10:45 AM   Specimen: Nasopharyngeal Swab; Nasopharyngeal(NP) swabs in vial transport medium  Result Value Ref Range Status   SARS Coronavirus 2 by RT PCR NEGATIVE NEGATIVE Final    Comment: (NOTE) SARS-CoV-2 target nucleic acids are NOT DETECTED.  The SARS-CoV-2 RNA is generally detectable in upper respiratory specimens during the acute phase of infection. The lowest concentration of SARS-CoV-2 viral copies this assay can detect is 138 copies/mL. A negative result does not preclude SARS-Cov-2 infection and should not be used as the sole basis for treatment or other  patient management decisions. A negative result may occur with  improper specimen collection/handling, submission of specimen other than nasopharyngeal swab, presence of viral mutation(s) within the areas targeted by this assay, and inadequate number of viral copies(<138 copies/mL). A negative result must be combined with clinical observations, patient history, and epidemiological information. The expected result is Negative.  Fact Sheet for Patients:  BloggerCourse.com  Fact Sheet for Healthcare Providers:  SeriousBroker.it  This test is no t yet approved or cleared by the [place] FDA and  has been authorized for detection and/or diagnosis of SARS-CoV-2 by FDA under an Emergency Use Authorization (EUA). This EUA will remain  in effect (meaning this test can be used) for the duration of the COVID-19 declaration under Section 564(b)(1) of the Act, 21 U.S.C.section 360bbb-3(b)(1), unless the authorization is terminated  or revoked sooner.  Influenza A by PCR NEGATIVE NEGATIVE Final   Influenza B by PCR NEGATIVE NEGATIVE Final    Comment: (NOTE) The Xpert Xpress SARS-CoV-2/FLU/RSV plus assay is intended as an aid in the diagnosis of influenza from Nasopharyngeal swab specimens and should not be used as a sole basis for treatment. Nasal washings and aspirates are unacceptable for Xpert Xpress SARS-CoV-2/FLU/RSV testing.  Fact Sheet for Patients: BloggerCourse.com  Fact Sheet for Healthcare Providers: SeriousBroker.it  This test is not yet approved or cleared by the [place] FDA and has been authorized for detection and/or diagnosis of SARS-CoV-2 by FDA under an Emergency Use Authorization (EUA). This EUA will remain in effect (meaning this test can be used) for the duration of the COVID-19 declaration under Section 564(b)(1) of the Act, 21 U.S.C. section  360bbb-3(b)(1), unless the authorization is terminated or revoked.  Performed at [facility], [address], [place], [place] [address]   Culture, blood (routine x 2)     Status: None (Preliminary result)   Collection Time: 04/11/20 10:50 AM   Specimen: BLOOD  Result Value Ref Range Status   Specimen Description BLOOD LEFT ANTECUBITAL  Final   Special Requests   Final    BOTTLES DRAWN AEROBIC AND ANAEROBIC Blood Culture adequate volume   Culture   Final    NO GROWTH < 24 HOURS Performed at [facility], [address], [place], [place] [address]    Report Status PENDING  Incomplete  Culture, blood (routine x 2)     Status: None (Preliminary result)   Collection Time: 04/11/20 11:00 AM   Specimen: BLOOD RIGHT FOREARM  Result Value Ref Range Status   Specimen Description BLOOD RIGHT FOREARM  Final   Special Requests   Final    BOTTLES DRAWN AEROBIC AND ANAEROBIC Blood Culture adequate volume   Culture   Final    NO GROWTH < 24 HOURS Performed at [facility], [address], [place], [place] [address]    Report Status PENDING  Incomplete         Radiology Studies: CT Head Wo Contrast  Result Date: 04/11/2020 CLINICAL DATA:  Delirium EXAM: CT HEAD WITHOUT CONTRAST TECHNIQUE: Contiguous axial images were obtained from the base of the skull through the vertex without intravenous contrast. COMPARISON:  03/24/2020 FINDINGS: Brain: There is no acute intracranial hemorrhage, mass effect, or edema. Gray-white differentiation is preserved. There is no extra-axial fluid collection. Prominence of the ventricles and sulci reflects stable parenchymal volume loss. Patchy and confluent areas of hypoattenuation in the supratentorial white matter are nonspecific but probably reflect stable chronic microvascular ischemic changes. Vascular: There is atherosclerotic calcification at the skull base. Skull: Calvarium is unremarkable. Sinuses/Orbits: Chronic right sphenoid  sinusitis. Other: None. IMPRESSION: No acute intracranial abnormality. Stable chronic/nonemergent findings detailed above. Electronically Signed   By: Guadlupe Spanish M.D.   On: 04/11/2020 12:26   DG Pelvis Portable  Result Date: 04/11/2020 CLINICAL DATA:  Altered mental status. EXAM: PORTABLE PELVIS 1-2 VIEWS COMPARISON:  Left hip series 12/23/2019. FINDINGS: Tubing noted over the abdomen and pelvis. Very large amount of stool noted throughout the colon rectum consistent with constipation and possible fecal impaction. Pelvic calcifications consistent phleboliths. Peripheral vascular calcification. Diffuse osteopenia. Degenerative changes scoliosis lumbar spine. Degenerative changes both hips. No acute bony abnormality identified. No evidence of fracture dislocation. IMPRESSION: 1. Very large amount of stool noted throughout the colon and rectum consistent with constipation and possible fecal impaction. 2. Diffuse osteopenia. Degenerative  changes scoliosis lumbar spine. Degenerative changes both hips. No acute bony abnormality. 3.  Peripheral vascular disease. Electronically Signed   By: Maisie Fus  Register   On: 04/11/2020 11:33   DG Chest Port 1 View  Result Date: 04/11/2020 CLINICAL DATA:  ams EXAM: PORTABLE CHEST 1 VIEW COMPARISON:  03/10/2020 chest radiograph and prior. FINDINGS: No focal consolidation, pneumothorax or pleural effusion. Cardiomediastinal silhouette within normal limits. No acute osseous abnormality. IMPRESSION: No acute airspace disease. Electronically Signed   By: Stana Bunting M.D.   On: 04/11/2020 11:30   DG Knee Left Port  Result Date: 04/11/2020 CLINICAL DATA:  Left knee bruising after fall. EXAM: PORTABLE LEFT KNEE - 1-2 VIEW COMPARISON:  None. FINDINGS: No evidence of fracture, dislocation, or joint effusion. Mild degenerative changes seen involving the medial and lateral joint spaces with osteophyte formation. Soft tissues are unremarkable. IMPRESSION: Mild degenerative  joint disease. No acute abnormality seen in the left knee. Electronically Signed   By: Lupita Raider M.D.   On: 04/11/2020 11:34        Scheduled Meds: . apixaban  5 mg Oral BID  . dronabinol  2.5 mg Oral BID AC  . feeding supplement  237 mL Oral BID BM  . mouth rinse  15 mL Mouth Rinse BID  . memantine  10 mg Oral BID  . neomycin-bacitracin-polymyxin   Topical BID   Continuous Infusions: . banana bag IV 1000 mL       LOS: 1 day    Time spent: 35 minutes    Belkys A Regalado, MD [facility]   If 7PM-7AM, please contact night-coverage [email or web address]  04/12/2020, 12:59 PM

## 2020-04-12 NOTE — Progress Notes (Signed)
Nutrition Brief Note  RD consulted for assessment of nutritional status and calorie count.   Spoke with MD via secure chat. Pt has now transitioned to full comfort care measures. RD will discontinue orders. No further nutrition interventions warranted at this time.   Please re-consult as needed.   Lars Masson, RD, LDN Clinical Nutrition After Hours/Weekend Pager # in Amion

## 2020-04-12 NOTE — Progress Notes (Signed)
Civil engineer, contracting [facility])  Referral received for residential hospice at [facility].  We do not have a bed to offer today.    ACC will have to get MD approval for appropriateness.  ACC will update once appropriateness and bed status are determined.  Wallis Bamberg RN, BSN, CCRN [facility] Liaison

## 2020-04-12 NOTE — Progress Notes (Signed)
Daily Progress Note   Patient Name: Carolyn Bishop       Date: 04/12/2020 DOB: 09/28/1932  Age: 86 y.o. MRN#: [id number] Attending Physician: Alba Cory, MD Primary Care Physician: Sharon Seller, NP Admit Date: 04/11/2020  Reason for Consultation/Follow-up: Establishing goals of care, Hospice Evaluation, Non pain symptom management, Pain control, Psychosocial/spiritual support and Terminal Care  Subjective: Chart review performed. Received report from primary RN - no acute concerns. RN states patient is sleeping most of the day. RN also explains that when staff attempt to encourage PO intake, patient spits out food and is not accepting; however, she is drinking small amounts of juice.   Went to visit patient at bedside - no family/visitors present. The patient was lying in bed - she did wake to voice/gentle touch - she denies pain and states "I am just tired." No signs or non-verbal gestures of pain or discomfort noted. No respiratory distress, increased work of breathing, or secretions noted.   Received call from patient's niece/Mellanie. She explains she has discussed the patient's current medical situation with the patient's two sisters - they are all in agreement to transition the patient to full comfort care at this time. We talked about transition to comfort measures in house and what that would entail inclusive of medications to control pain, dyspnea, agitation, nausea, itching, and hiccups. We discussed stopping all uneccessary measures such as blood draws, needle sticks, oxygen, antibiotics, CBGs/insulin, cardiac monitoring, and frequent vital signs. Mellanie was agreeable.   Informed Mellanie that, unfortunately, [facility] does not have a residential hospice facility. Mellanie is  still wanting the patient transferred to residential hospice - she explained they live in [place] and therefore would like [facility].   Education was provided on [employer] EOL visitation policy - she expressed appreciation.   Emotional support and therapeutic listening was provided to [facility].  All questions and concerns addressed. Encouraged to call with questions and/or concerns. PMT card provided.   Length of Stay: 1  Current Medications: Scheduled Meds:  . antiseptic oral rinse  15 mL Topical BID  . apixaban  5 mg Oral BID  . dronabinol  2.5 mg Oral BID AC  . memantine  10 mg Oral BID  . neomycin-bacitracin-polymyxin   Topical BID    Continuous Infusions:   PRN Meds: acetaminophen **OR**  acetaminophen, glycopyrrolate **OR** glycopyrrolate **OR** glycopyrrolate, haloperidol lactate, hydrALAZINE, LORazepam, morphine injection, ondansetron **OR** ondansetron (ZOFRAN) IV, polyvinyl alcohol  Physical Exam Vitals and nursing note reviewed.  Constitutional:      General: She is not in acute distress.    Appearance: She is ill-appearing.     Comments: Frail appearing  Pulmonary:     Effort: No respiratory distress.  Skin:    General: Skin is warm and dry.  Neurological:     Mental Status: She is lethargic and disoriented.     Motor: Weakness present.  Psychiatric:        Behavior: Behavior is cooperative.        Cognition and Memory: Cognition is impaired. Memory is impaired.             Vital Signs: BP (!) 156/78 (BP Location: Right Arm)   Pulse 88   Temp 98 F (36.7 C) (Oral)   Resp 16   Wt 61.7 kg   SpO2 100%   BMI 24.09 kg/m  SpO2: SpO2: 100 % O2 Device: O2 Device: Room Air O2 Flow Rate:    Intake/output summary:   Intake/Output Summary (Last 24 hours) at 04/12/2020 1420 Last data filed at 04/12/2020 0825 Gross per 24 hour  Intake 1748.33 ml  Output 0 ml  Net 1748.33 ml   LBM: Last BM Date: 04/11/20 Baseline Weight: Weight: 61.7 kg Most recent  weight: Weight: 61.7 kg       Palliative Assessment/Data: PPS 20%    Flowsheet Rows     Most Recent Value  Intake Tab  Referral Department Hospitalist  Unit at Time of Referral ER  Palliative Care Primary Diagnosis Neurology  Date Notified 04/11/20  Palliative Care Type Return patient Palliative Care  Reason for referral Counsel Regarding Hospice, Clarify Goals of Care  Date of Admission 04/11/20  Date first seen by Palliative Care 04/11/20  # of days Palliative referral response time 0 Day(s)  # of days IP prior to Palliative referral 0  Clinical Assessment  Psychosocial & Spiritual Assessment  Palliative Care Outcomes  Patient/Family meeting held? Yes  Who was at the meeting? niece  Palliative Care Outcomes Clarified goals of care, Counseled regarding hospice, Provided psychosocial or spiritual support  Patient/Family wishes: Interventions discontinued/not started  PEG, Trach      Patient Active Problem List   Diagnosis Date Noted  . Dehydration 04/11/2020  . Dementia without behavioral disturbance (HCC)   . Palliative care by specialist   . Goals of care, counseling/discussion   . UTI (urinary tract infection) 03/11/2020  . DM (diabetes mellitus), type 2 (HCC) 03/11/2020  . Debility 03/11/2020  . DVT (deep venous thrombosis) (HCC) 03/11/2020  . Chronic diastolic CHF (congestive heart failure) (HCC) 03/11/2020  . Pressure injury of skin 03/10/2020  . Malnutrition of moderate degree 12/22/2019  . Fall at home 12/20/2019  . Acute encephalopathy 10/28/2019  . Grade I diastolic dysfunction 10/28/2019  . HLD (hyperlipidemia) 10/28/2017  . Acute urinary retention   . AKI (acute kidney injury) (HCC) 10/27/2017  . Hypotension 10/27/2017  . Prediabetes 10/27/2017  . Essential hypertension 10/27/2017  . Hypoglycemia 10/27/2017  . Normocytic anemia 10/27/2017  . GIB (gastrointestinal bleeding) 10/27/2017  . Lung mass 10/27/2017  . Lactic acid acidosis 10/27/2017  .  Left arm weakness 10/27/2017    Palliative Care Assessment & Plan   Patient Profile: 86 y.o. female  with past medical history of advanced dementia, HTN, recently diagnosed right-sided DVT on eliquis presented to  the ED on 04/11/20 for decreased PO intake x several days as well as increased weakness and confusion.   ED Course:Patient was found to be severely dehydrated, IV hydration started. CT head negative for acute structural changes or bleeding. UA pending.  Patient was seen by PMT during last hospitalization from 11/1-11/8/21 for UTI. She was seen in the ED on 03/24/20 for fall encounter.   Assessment: Failure to thrive Right side DVT HTN Dehydration Pressure injury of skin Frailty Advanced dementia Terminal care  Recommendations/Plan: Initiated full comfort measures - likely days to <2 weeks Continue DNR/DNI as previously documented Family would like patient transferred to [facility] and [facility] hospice liaison notified; [facility] consult placed.  Added orders for EOL symptom management and to reflect full comfort measures, as well as discontinued orders that were not focused on comfort Unrestricted visitation orders were placed per current [place] COVID19 EOL visitation policy  Provide frequent assessments and administer PRN medications as clinically necessary to ensure EOL comfort PMT will continue to follow holistically   Goals of Care and Additional Recommendations:  Limitations on Scope of Treatment: Full Comfort Care and Initiate Comfort Feeding  Code Status:    Code Status Orders  (From admission, onward)         Start     Ordered   04/12/20 1413  Do not attempt resuscitation (DNR)  Continuous       Question Answer Comment  In the event of cardiac or respiratory ARREST Do not call a "code blue"   In the event of cardiac or respiratory ARREST Do not perform Intubation, CPR, defibrillation or ACLS   In the event of cardiac or respiratory ARREST Use  medication by any route, position, wound care, and other measures to relive pain and suffering. May use oxygen, suction and manual treatment of airway obstruction as needed for comfort.      04/12/20 1419        Code Status History    Date Active Date Inactive Code Status Order ID Comments User Context   04/11/2020 1531 04/12/2020 1419 DNR [id number]  Emeline General, MD ED   03/11/2020 0818 03/18/2020 0548 DNR [id number]  Therisa Doyne, MD ED   12/20/2019 2320 12/25/2019 1933 Full Code [id number]  Hughie Closs, MD ED   10/29/2019 0204 11/02/2019 0623 Full Code [id number]  Joselyn Arrow, MD ED   10/28/2017 0007 10/31/2017 1917 Full Code [id number]  Lorretta Harp, MD Inpatient   Advance Care Planning Activity       Prognosis:   < 2 weeks  Discharge Planning:  Hospice facility  Care plan was discussed with primary RN, niece/Mellanie, Dr. Sunnie Nielsen, [facility], [facility] hospice liaison   Thank you for allowing the Palliative Medicine Team to assist in the care of this patient.   Total Time 35 minutes Prolonged Time Billed  no       Greater than 50%  of this time was spent counseling and coordinating care related to the above assessment and plan.  Haskel Khan, NP  Please contact Palliative Medicine Team phone at [phone] for questions and concerns.

## 2020-04-12 NOTE — Progress Notes (Signed)
PT Cancellation/DC  Note  Patient Details Name: Sabena Winner MRN: [id number] DOB: 18-Mar-1933   Cancelled Treatment:    Reason Eval/Treat Not Completed: Other (comment). Plan appears to be for patient to pursue residential hospice. Will sign off.    Angelina Ok [facility] 04/12/2020, 8:31 AM Skip Mayer PT Acute Rehabilitation Services Pager [phone] Office [phone]

## 2020-04-13 NOTE — Progress Notes (Signed)
Daily Progress Note   Patient Name: Carolyn Bishop       Date: 04/13/2020 DOB: 1933-02-16  Age: 86 y.o. MRN#: [id number] Attending Physician: Alba Cory, MD Primary Care Physician: Sharon Seller, NP Admit Date: 04/11/2020  Reason for Consultation/Follow-up: Non pain symptom management, Pain control and Terminal Care  Subjective: Chart review performed. Received report from primary RN - no acute concerns. RN reports patient has minimal PO intake.   Went to visit patient at bedside - no family/visitors present. Patient was lying in bed awake, alert, pleasantly confused, and able to participate in simple conversation. No signs or non-verbal gestures of pain or discomfort noted. No respiratory distress, increased work of breathing, or secretions noted. Patient states her legs hurt.   Asked RN to administer tylenol for patient's complaint of leg pain.   Length of Stay: 2  Current Medications: Scheduled Meds:  . antiseptic oral rinse  15 mL Topical BID  . apixaban  5 mg Oral BID  . dronabinol  2.5 mg Oral BID AC  . memantine  10 mg Oral BID  . neomycin-bacitracin-polymyxin   Topical BID    Continuous Infusions:   PRN Meds: acetaminophen **OR** acetaminophen, glycopyrrolate **OR** glycopyrrolate **OR** glycopyrrolate, haloperidol lactate, hydrALAZINE, LORazepam, morphine injection, ondansetron **OR** ondansetron (ZOFRAN) IV, polyvinyl alcohol  Physical Exam Vitals and nursing note reviewed.  Constitutional:      General: She is not in acute distress.    Appearance: She is ill-appearing.     Comments: Frail appearing  Pulmonary:     Effort: No respiratory distress.  Skin:    General: Skin is warm and dry.  Neurological:     Mental Status: She is alert. She is disoriented  and confused.     Motor: Weakness present.  Psychiatric:        Behavior: Behavior is cooperative.        Cognition and Memory: Cognition is impaired. Memory is impaired.             Vital Signs: BP (!) 166/63 (BP Location: Left Arm)   Pulse 70   Temp 98.3 F (36.8 C) (Oral)   Resp 16   Wt 63.5 kg   SpO2 97%   BMI 24.80 kg/m  SpO2: SpO2: 97 % O2 Device: O2 Device: Room Air O2 Flow Rate:  Intake/output summary:   Intake/Output Summary (Last 24 hours) at 04/13/2020 1216 Last data filed at 04/13/2020 0900 Gross per 24 hour  Intake 920 ml  Output 775 ml  Net 145 ml   LBM: Last BM Date: 04/11/20 Baseline Weight: Weight: 61.7 kg Most recent weight: Weight: 63.5 kg       Palliative Assessment/Data: PPS 20%    Flowsheet Rows     Most Recent Value  Intake Tab  Referral Department Hospitalist  Unit at Time of Referral ER  Palliative Care Primary Diagnosis Neurology  Date Notified 04/11/20  Palliative Care Type Return patient Palliative Care  Reason for referral Counsel Regarding Hospice, Clarify Goals of Care  Date of Admission 04/11/20  Date first seen by Palliative Care 04/11/20  # of days Palliative referral response time 0 Day(s)  # of days IP prior to Palliative referral 0  Clinical Assessment  Psychosocial & Spiritual Assessment  Palliative Care Outcomes  Patient/Family meeting held? Yes  Who was at the meeting? niece  Palliative Care Outcomes Clarified goals of care, Counseled regarding hospice, Provided psychosocial or spiritual support  Patient/Family wishes: Interventions discontinued/not started  PEG, Trach      Patient Active Problem List   Diagnosis Date Noted  . Dehydration 04/11/2020  . Dementia without behavioral disturbance (HCC)   . Palliative care by specialist   . Goals of care, counseling/discussion   . UTI (urinary tract infection) 03/11/2020  . DM (diabetes mellitus), type 2 (HCC) 03/11/2020  . Debility 03/11/2020  . DVT (deep venous  thrombosis) (HCC) 03/11/2020  . Chronic diastolic CHF (congestive heart failure) (HCC) 03/11/2020  . Pressure injury of skin 03/10/2020  . Malnutrition of moderate degree 12/22/2019  . Fall at home 12/20/2019  . Acute encephalopathy 10/28/2019  . Grade I diastolic dysfunction 10/28/2019  . HLD (hyperlipidemia) 10/28/2017  . Acute urinary retention   . AKI (acute kidney injury) (HCC) 10/27/2017  . Hypotension 10/27/2017  . Prediabetes 10/27/2017  . Essential hypertension 10/27/2017  . Hypoglycemia 10/27/2017  . Normocytic anemia 10/27/2017  . GIB (gastrointestinal bleeding) 10/27/2017  . Lung mass 10/27/2017  . Lactic acid acidosis 10/27/2017  . Left arm weakness 10/27/2017    Palliative Care Assessment & Plan   Patient Profile: 86 y.o.femalewith past medical history of advanced dementia, HTN, recently diagnosed right-sided DVT on eliquis presented to the ED on 04/11/20 for decreased PO intake x several days as well as increased weakness and confusion.  ED Course:Patient was found to be severely dehydrated, IV hydration started. CT head negative for acute structural changes or bleeding. UA pending.  Patient was seen by PMT during last hospitalization from 11/1-11/8/21 for UTI. She was seen in the ED on 03/24/20 for fall encounter.   Assessment: Failure to thrive Right side DVT HTN Dehydration Pressure injury of skin Frailty Advanced dementia Terminal care  Recommendations/Plan:  Continue full comfort measures  Continue DNR/DNI as previously documented  Transfer to [facility] when bed becomes available; per [facility] liaison note, they do not have a bed today  Continue current EOL medication regimen, patient seems comfortable  Provide frequent assessments and administer PRN medications as clinically necessary to ensure EOL comfort  PMT will continue to follow peripherally. If there are any imminent needs please call the service directly  Goals of Care and  Additional Recommendations:  Limitations on Scope of Treatment: Full Comfort Care  Code Status:    Code Status Orders  (From admission, onward)  Start     Ordered   04/12/20 1413  Do not attempt resuscitation (DNR)  Continuous       Question Answer Comment  In the event of cardiac or respiratory ARREST Do not call a "code blue"   In the event of cardiac or respiratory ARREST Do not perform Intubation, CPR, defibrillation or ACLS   In the event of cardiac or respiratory ARREST Use medication by any route, position, wound care, and other measures to relive pain and suffering. May use oxygen, suction and manual treatment of airway obstruction as needed for comfort.      04/12/20 1419        Code Status History    Date Active Date Inactive Code Status Order ID Comments User Context   04/11/2020 1531 04/12/2020 1419 DNR [id number]  Emeline General, MD ED   03/11/2020 0818 03/18/2020 0548 DNR [id number]  Therisa Doyne, MD ED   12/20/2019 2320 12/25/2019 1933 Full Code [id number]  Hughie Closs, MD ED   10/29/2019 0204 11/02/2019 0623 Full Code [id number]  Joselyn Arrow, MD ED   10/28/2017 0007 10/31/2017 1917 Full Code [id number]  Lorretta Harp, MD Inpatient   Advance Care Planning Activity       Prognosis:   < 2 weeks  Discharge Planning:  Hospice facility  Care plan was discussed with primary RN  Thank you for allowing the Palliative Medicine Team to assist in the care of this patient.   Total Time 15 minutes Prolonged Time Billed  no       Greater than 50%  of this time was spent counseling and coordinating care related to the above assessment and plan.  Haskel Khan, NP  Please contact Palliative Medicine Team phone at [phone] for questions and concerns.

## 2020-04-13 NOTE — Progress Notes (Signed)
PROGRESS NOTE    Carolyn Bishop  MCN:[id number] DOB: Apr 21, 1933 DOA: 04/11/2020 PCP: Sharon Seller, NP   Brief Narrative: 86 year old with past medical history significant for advanced dementia, hypertension, hyperlipidemia, recent diagnosis of right thigh DVT on Eliquis presenting with decreased oral intake, worsening confusion.  Patient baseline has dementia and significant confusion unable to provide any history.  Patient niece has been her only caregiver, who report patient has had frequent confusion, since her last hospitalization 4 weeks ago.  Her oral intake has decreased significantly over the last 2 weeks.  Over the last 3 days patient has stopped eating and drinking altogether and has been more sleepy throughout the day.  Palliative care was consulted for goals of care, Family decided to proceed with comfort care. Plan for residential hospice facility.    Assessment & Plan:   Active Problems:   Dehydration   1-FTT;  Likely related to further decline of her dementia. Palliative care following, family opted for Comfort care. Plan for residential hospice facility   Continue with Ensure, trial of Marinol. Fluids discontinue.  Blood Cultures no growth.  CT head no acute intracranial abnormality Patient with poor oral intake.   2-Right side DVT: Continue with Eliquis  3-Hypertension: hold amlodipine.  Continue with IV hydralazine as needed. 4-advanced dementia: Supportive care    Pressure Injury 03/10/20 Sacrum Medial Stage 2 -  Partial thickness loss of dermis presenting as a shallow open injury with a red, pink wound bed without slough. (Active)  03/10/20 1734  Location: Sacrum  Location Orientation: Medial  Staging: Stage 2 -  Partial thickness loss of dermis presenting as a shallow open injury with a red, pink wound bed without slough.  Wound Description (Comments):   Present on Admission: Yes    Estimated body mass index is 24.8 kg/m as calculated from the  following:   Height as of 03/10/20: 5\' 3"  (1.6 m).   Weight as of this encounter: 63.5 kg.   DVT prophylaxis: Eliquis   Code Status: DNR Family Communication: family updated by palliative,  Disposition Plan:  Status is: Inpatient  Remains inpatient appropriate because:IV treatments appropriate due to intensity of illness or inability to take PO   Dispo: The patient is from: Home              Anticipated d/c is to: Residential hospice facility              Anticipated d/c date is: 1 day              Patient currently is medically stable to d/c.  Awaiting residential hospice bed        Consultants:   Palliative care  Procedures:   none  Antimicrobials:    Subjective: patient open eyes to voice, fall back to sleep.   Objective: Vitals:   04/12/20 0501 04/12/20 0919 04/12/20 2055 04/13/20 0535  BP: (!) 176/76 (!) 156/78 140/63 (!) 166/63  Pulse: 83 88 76 70  Resp: 16 16 20 16   Temp: 98.2 F (36.8 C) 98 F (36.7 C) 98.1 F (36.7 C) 98.3 F (36.8 C)  TempSrc:  Oral Oral Oral  SpO2: 100% 100% 97% 97%  Weight:   63.5 kg     Intake/Output Summary (Last 24 hours) at 04/13/2020 1151 Last data filed at 04/13/2020 0900 Gross per 24 hour  Intake 920 ml  Output 775 ml  Net 145 ml   Filed Weights   04/11/20 2104 04/12/20 2055  Weight: 61.7 kg  63.5 kg    Examination:  General exam: NAD Respiratory system: CTA Cardiovascular system: S 1, S 2 RRR. Gastrointestinal system: BS present, soft , nt Central nervous system: sleepy  Extremities: no edema  Data Reviewed: I have personally reviewed following labs and imaging studies  CBC: Recent Labs  Lab 04/11/20 1030  WBC 6.6  NEUTROABS 5.5  HGB 12.4  HCT 37.5  MCV 91.2  PLT 352   Basic Metabolic Panel: Recent Labs  Lab 04/11/20 1030  NA 144  K 3.8  CL 106  CO2 22  GLUCOSE 190*  BUN 25*  CREATININE 1.02*  CALCIUM 9.9   GFR: Estimated Creatinine Clearance: 36.2 mL/min (A) (by C-G formula based  on SCr of 1.02 mg/dL (H)). Liver Function Tests: Recent Labs  Lab 04/11/20 1030  AST 20  ALT 11  ALKPHOS 62  BILITOT 0.9  PROT 7.6  ALBUMIN 3.2*   Recent Labs  Lab 04/11/20 1030  LIPASE 48   No results for input(s): AMMONIA in the last 168 hours. Coagulation Profile: Recent Labs  Lab 04/11/20 1030  INR 1.1   Cardiac Enzymes: No results for input(s): CKTOTAL, CKMB, CKMBINDEX, TROPONINI in the last 168 hours. BNP (last 3 results) No results for input(s): PROBNP in the last 8760 hours. HbA1C: No results for input(s): HGBA1C in the last 72 hours. CBG: No results for input(s): GLUCAP in the last 168 hours. Lipid Profile: No results for input(s): CHOL, HDL, LDLCALC, TRIG, CHOLHDL, LDLDIRECT in the last 72 hours. Thyroid Function Tests: No results for input(s): TSH, T4TOTAL, FREET4, T3FREE, THYROIDAB in the last 72 hours. Anemia Panel: No results for input(s): VITAMINB12, FOLATE, FERRITIN, TIBC, IRON, RETICCTPCT in the last 72 hours. Sepsis Labs: Recent Labs  Lab 04/11/20 1030 04/11/20 1244  LATICACIDVEN 1.5 1.5    Recent Results (from the past 240 hour(s))  Resp Panel by RT-PCR (Flu A&B, Covid) Nasopharyngeal Swab     Status: None   Collection Time: 04/11/20 10:45 AM   Specimen: Nasopharyngeal Swab; Nasopharyngeal(NP) swabs in vial transport medium  Result Value Ref Range Status   SARS Coronavirus 2 by RT PCR NEGATIVE NEGATIVE Final    Comment: (NOTE) SARS-CoV-2 target nucleic acids are NOT DETECTED.  The SARS-CoV-2 RNA is generally detectable in upper respiratory specimens during the acute phase of infection. The lowest concentration of SARS-CoV-2 viral copies this assay can detect is 138 copies/mL. A negative result does not preclude SARS-Cov-2 infection and should not be used as the sole basis for treatment or other patient management decisions. A negative result may occur with  improper specimen collection/handling, submission of specimen other than  nasopharyngeal swab, presence of viral mutation(s) within the areas targeted by this assay, and inadequate number of viral copies(<138 copies/mL). A negative result must be combined with clinical observations, patient history, and epidemiological information. The expected result is Negative.  Fact Sheet for Patients:  BloggerCourse.com  Fact Sheet for Healthcare Providers:  SeriousBroker.it  This test is no t yet approved or cleared by the [place] FDA and  has been authorized for detection and/or diagnosis of SARS-CoV-2 by FDA under an Emergency Use Authorization (EUA). This EUA will remain  in effect (meaning this test can be used) for the duration of the COVID-19 declaration under Section 564(b)(1) of the Act, 21 U.S.C.section 360bbb-3(b)(1), unless the authorization is terminated  or revoked sooner.       Influenza A by PCR NEGATIVE NEGATIVE Final   Influenza B by PCR NEGATIVE NEGATIVE Final  Comment: (NOTE) The Xpert Xpress SARS-CoV-2/FLU/RSV plus assay is intended as an aid in the diagnosis of influenza from Nasopharyngeal swab specimens and should not be used as a sole basis for treatment. Nasal washings and aspirates are unacceptable for Xpert Xpress SARS-CoV-2/FLU/RSV testing.  Fact Sheet for Patients: BloggerCourse.com  Fact Sheet for Healthcare Providers: SeriousBroker.it  This test is not yet approved or cleared by the [place] FDA and has been authorized for detection and/or diagnosis of SARS-CoV-2 by FDA under an Emergency Use Authorization (EUA). This EUA will remain in effect (meaning this test can be used) for the duration of the COVID-19 declaration under Section 564(b)(1) of the Act, 21 U.S.C. section 360bbb-3(b)(1), unless the authorization is terminated or revoked.  Performed at [facility], [address] [place], [place] [address]    Culture, blood (routine x 2)     Status: None (Preliminary result)   Collection Time: 04/11/20 10:50 AM   Specimen: BLOOD  Result Value Ref Range Status   Specimen Description BLOOD LEFT ANTECUBITAL  Final   Special Requests   Final    BOTTLES DRAWN AEROBIC AND ANAEROBIC Blood Culture adequate volume   Culture   Final    NO GROWTH 2 DAYS Performed at [facility], [address], [place], [place] [address]    Report Status PENDING  Incomplete  Culture, blood (routine x 2)     Status: None (Preliminary result)   Collection Time: 04/11/20 11:00 AM   Specimen: BLOOD RIGHT FOREARM  Result Value Ref Range Status   Specimen Description BLOOD RIGHT FOREARM  Final   Special Requests   Final    BOTTLES DRAWN AEROBIC AND ANAEROBIC Blood Culture adequate volume   Culture   Final    NO GROWTH 2 DAYS Performed at [facility], [address], [place], [place] [address]    Report Status PENDING  Incomplete         Radiology Studies: CT Head Wo Contrast  Result Date: 04/11/2020 CLINICAL DATA:  Delirium EXAM: CT HEAD WITHOUT CONTRAST TECHNIQUE: Contiguous axial images were obtained from the base of the skull through the vertex without intravenous contrast. COMPARISON:  03/24/2020 FINDINGS: Brain: There is no acute intracranial hemorrhage, mass effect, or edema. Gray-white differentiation is preserved. There is no extra-axial fluid collection. Prominence of the ventricles and sulci reflects stable parenchymal volume loss. Patchy and confluent areas of hypoattenuation in the supratentorial white matter are nonspecific but probably reflect stable chronic microvascular ischemic changes. Vascular: There is atherosclerotic calcification at the skull base. Skull: Calvarium is unremarkable. Sinuses/Orbits: Chronic right sphenoid sinusitis. Other: None. IMPRESSION: No acute intracranial abnormality. Stable chronic/nonemergent findings detailed above. Electronically Signed   By: Guadlupe Spanish  M.D.   On: 04/11/2020 12:26        Scheduled Meds: . antiseptic oral rinse  15 mL Topical BID  . apixaban  5 mg Oral BID  . dronabinol  2.5 mg Oral BID AC  . memantine  10 mg Oral BID  . neomycin-bacitracin-polymyxin   Topical BID   Continuous Infusions:    LOS: 2 days    Time spent: 35 minutes    Taegan Standage A Sorina Derrig, MD [facility]   If 7PM-7AM, please contact night-coverage [email or web address]  04/13/2020, 11:51 AM

## 2020-04-13 NOTE — Progress Notes (Signed)
[facility] ([facility])  There is not a bed to offer Carolyn Bishop today.  [facility] will update once there is an available.   Wallis Bamberg RN, BSN, CCRN  [facility] Liaison

## 2020-04-14 MED ORDER — SENNOSIDES-DOCUSATE SODIUM 8.6-50 MG PO TABS
1.0000 | ORAL_TABLET | Freq: Two times a day (BID) | ORAL | Status: DC
  Administered 2020-04-14 – 2020-04-18 (×7): 1 via ORAL
  Filled 2020-04-14 (×7): qty 1

## 2020-04-14 MED ORDER — SENNOSIDES-DOCUSATE SODIUM 8.6-50 MG PO TABS
1.0000 | ORAL_TABLET | Freq: Two times a day (BID) | ORAL | 0 refills | Status: DC
Start: 2020-04-14 — End: 2022-10-12

## 2020-04-14 MED ORDER — DRONABINOL 2.5 MG PO CAPS
2.5000 mg | ORAL_CAPSULE | Freq: Two times a day (BID) | ORAL | 0 refills | Status: DC
Start: 2020-04-14 — End: 2022-10-12

## 2020-04-14 MED ORDER — BISACODYL 5 MG PO TBEC
5.0000 mg | DELAYED_RELEASE_TABLET | Freq: Once | ORAL | Status: AC
  Administered 2020-04-14: 5 mg via ORAL
  Filled 2020-04-14: qty 1

## 2020-04-14 MED ORDER — GLYCOPYRROLATE 1 MG PO TABS
1.0000 mg | ORAL_TABLET | ORAL | 0 refills | Status: DC | PRN
Start: 2020-04-14 — End: 2022-10-12

## 2020-04-14 MED ORDER — ONDANSETRON 4 MG PO TBDP
4.0000 mg | ORAL_TABLET | Freq: Four times a day (QID) | ORAL | 0 refills | Status: DC | PRN
Start: 2020-04-14 — End: 2022-10-12

## 2020-04-14 NOTE — Discharge Summary (Signed)
Physician Discharge Summary  Carolyn Bishop EXB:[id number] DOB: 1932-09-03 DOA: 04/11/2020  PCP: Sharon Seller, NP  Admit date: 04/11/2020 Discharge date: 04/14/2020  Admitted From: Home  Disposition:  Residential Hospice facility   Recommendations for Outpatient Follow-up:  1. Comfort care.    Discharge Condition: stable.  CODE STATUS: DNR Diet recommendation: Comfort Diet.   Brief/Interim Summary: 86 year old with past medical history significant for advanced dementia, hypertension, hyperlipidemia, recent diagnosis of right thigh DVT on Eliquis presenting with decreased oral intake, worsening confusion.  Patient baseline has dementia and significant confusion unable to provide any history.  Patient niece has been her only caregiver, who report patient has had frequent confusion, since her last hospitalization 4 weeks ago.  Her oral intake has decreased significantly over the last 2 weeks.  Over the last 3 days patient has stopped eating and drinking altogether and has been more sleepy throughout the day.  Palliative care was consulted for goals of care, Family decided to proceed with comfort care. Plan for residential hospice facility.    1-FTT;  Likely related to further decline of her dementia. Palliative care following, family opted for Comfort care. Plan for residential hospice facility   Continue with Ensure, trial of Marinol. Fluids discontinue.  Blood Cultures no growth.  CT head no acute intracranial abnormality Patient with poor oral intake.  Awaiting residential hospice.  Start Bowel regimen.   2-Right side DVT: Continue with Eliquis  3-Hypertension: hold amlodipine.  Continue with IV hydralazine as needed. 4-advanced dementia: Supportive care    Pressure Injury 03/10/20 Sacrum Medial Stage 2 -  Partial thickness loss of dermis presenting as a shallow open injury with a red, pink wound bed without slough. (Active)  03/10/20 1734  Location: Sacrum   Location Orientation: Medial  Staging: Stage 2 -  Partial thickness loss of dermis presenting as a shallow open injury with a red, pink wound bed without slough.  Wound Description (Comments):   Present on Admission: Yes     Discharge Diagnoses:  Active Problems:   Dehydration    Discharge Instructions  Discharge Instructions    Diet - low sodium heart healthy   Complete by: As directed    Increase activity slowly   Complete by: As directed      Allergies as of 04/14/2020   No Known Allergies     Medication List    STOP taking these medications   amLODipine 5 MG tablet Commonly known as: NORVASC   cyanocobalamin 1000 MCG/ML injection Commonly known as: (VITAMIN B-12)   feeding supplement Liqd   multivitamin with minerals Tabs tablet     TAKE these medications   acetaminophen 500 MG tablet Commonly known as: TYLENOL Take 1,000 mg by mouth every 6 (six) hours as needed for mild pain or headache.   apixaban 5 MG Tabs tablet Commonly known as: ELIQUIS Take 1 tablet (5 mg total) by mouth 2 (two) times daily. First dose on Mon 03/17/20 at 2200   dronabinol 2.5 MG capsule Commonly known as: MARINOL Take 1 capsule (2.5 mg total) by mouth 2 (two) times daily before lunch and supper.   glycopyrrolate 1 MG tablet Commonly known as: ROBINUL Take 1 tablet (1 mg total) by mouth every 4 (four) hours as needed (excessive secretions).   memantine 10 MG tablet Commonly known as: NAMENDA Take 10 mg by mouth 2 (two) times daily.   ondansetron 4 MG disintegrating tablet Commonly known as: ZOFRAN-ODT Take 1 tablet (4 mg total) by mouth every  6 (six) hours as needed for nausea.   senna-docusate 8.6-50 MG tablet Commonly known as: Senokot-S Take 1 tablet by mouth 2 (two) times daily.       No Known Allergies  Consultations:  Palliative.    Procedures/Studies: CT Head Wo Contrast  Result Date: 04/11/2020 CLINICAL DATA:  Delirium EXAM: CT HEAD WITHOUT CONTRAST  TECHNIQUE: Contiguous axial images were obtained from the base of the skull through the vertex without intravenous contrast. COMPARISON:  03/24/2020 FINDINGS: Brain: There is no acute intracranial hemorrhage, mass effect, or edema. Gray-white differentiation is preserved. There is no extra-axial fluid collection. Prominence of the ventricles and sulci reflects stable parenchymal volume loss. Patchy and confluent areas of hypoattenuation in the supratentorial white matter are nonspecific but probably reflect stable chronic microvascular ischemic changes. Vascular: There is atherosclerotic calcification at the skull base. Skull: Calvarium is unremarkable. Sinuses/Orbits: Chronic right sphenoid sinusitis. Other: None. IMPRESSION: No acute intracranial abnormality. Stable chronic/nonemergent findings detailed above. Electronically Signed   By: Guadlupe Spanish M.D.   On: 04/11/2020 12:26   CT Head Wo Contrast  Result Date: 03/24/2020 CLINICAL DATA:  Unwitnessed fall EXAM: CT HEAD WITHOUT CONTRAST TECHNIQUE: Contiguous axial images were obtained from the base of the skull through the vertex without intravenous contrast. COMPARISON:  03/11/2020 FINDINGS: Brain: No evidence of acute infarction, hemorrhage, hydrocephalus, extra-axial collection or mass lesion/mass effect. Moderate low-density changes within the periventricular and subcortical white matter compatible with chronic microvascular ischemic change. Moderate diffuse cerebral volume loss. Vascular: Atherosclerotic calcifications involving the large vessels of the skull base. No unexpected hyperdense vessel. Skull: Normal. Negative for fracture or focal lesion. Sinuses/Orbits: Chronic mucosal thickening within the right sphenoid sinus. Remaining paranasal sinuses and mastoid air cells are clear. Other: None. IMPRESSION: 1. No acute intracranial findings. 2. Chronic microvascular ischemic change and cerebral volume loss. Electronically Signed   By: Duanne Guess D.O.   On: 03/24/2020 10:44   DG Pelvis Portable  Result Date: 04/11/2020 CLINICAL DATA:  Altered mental status. EXAM: PORTABLE PELVIS 1-2 VIEWS COMPARISON:  Left hip series 12/23/2019. FINDINGS: Tubing noted over the abdomen and pelvis. Very large amount of stool noted throughout the colon rectum consistent with constipation and possible fecal impaction. Pelvic calcifications consistent phleboliths. Peripheral vascular calcification. Diffuse osteopenia. Degenerative changes scoliosis lumbar spine. Degenerative changes both hips. No acute bony abnormality identified. No evidence of fracture dislocation. IMPRESSION: 1. Very large amount of stool noted throughout the colon and rectum consistent with constipation and possible fecal impaction. 2. Diffuse osteopenia. Degenerative changes scoliosis lumbar spine. Degenerative changes both hips. No acute bony abnormality. 3.  Peripheral vascular disease. Electronically Signed   By: Maisie Fus  Register   On: 04/11/2020 11:33   DG Chest Port 1 View  Result Date: 04/11/2020 CLINICAL DATA:  ams EXAM: PORTABLE CHEST 1 VIEW COMPARISON:  03/10/2020 chest radiograph and prior. FINDINGS: No focal consolidation, pneumothorax or pleural effusion. Cardiomediastinal silhouette within normal limits. No acute osseous abnormality. IMPRESSION: No acute airspace disease. Electronically Signed   By: Stana Bunting M.D.   On: 04/11/2020 11:30   DG Knee Left Port  Result Date: 04/11/2020 CLINICAL DATA:  Left knee bruising after fall. EXAM: PORTABLE LEFT KNEE - 1-2 VIEW COMPARISON:  None. FINDINGS: No evidence of fracture, dislocation, or joint effusion. Mild degenerative changes seen involving the medial and lateral joint spaces with osteophyte formation. Soft tissues are unremarkable. IMPRESSION: Mild degenerative joint disease. No acute abnormality seen in the left knee. Electronically Signed   By: Fayrene Fearing  Christen Butter M.D.   On: 04/11/2020 11:34     Subjective: Alert,  would like something for constipation.   Discharge Exam: Vitals:   04/12/20 2055 04/13/20 0535  BP: 140/63 (!) 166/63  Pulse: 76 70  Resp: 20 16  Temp: 98.1 F (36.7 C) 98.3 F (36.8 C)  SpO2: 97% 97%     General: Pt is alert, awake, not in acute distress Cardiovascular: RRR, S1/S2 +, no rubs, no gallops Respiratory: CTA bilaterally, no wheezing, no rhonchi Abdominal: Soft, NT, ND, bowel sounds + Extremities: no edema, no cyanosis    The results of significant diagnostics from this hospitalization (including imaging, microbiology, ancillary and laboratory) are listed below for reference.     Microbiology: Recent Results (from the past 240 hour(s))  Resp Panel by RT-PCR (Flu A&B, Covid) Nasopharyngeal Swab     Status: None   Collection Time: 04/11/20 10:45 AM   Specimen: Nasopharyngeal Swab; Nasopharyngeal(NP) swabs in vial transport medium  Result Value Ref Range Status   SARS Coronavirus 2 by RT PCR NEGATIVE NEGATIVE Final    Comment: (NOTE) SARS-CoV-2 target nucleic acids are NOT DETECTED.  The SARS-CoV-2 RNA is generally detectable in upper respiratory specimens during the acute phase of infection. The lowest concentration of SARS-CoV-2 viral copies this assay can detect is 138 copies/mL. A negative result does not preclude SARS-Cov-2 infection and should not be used as the sole basis for treatment or other patient management decisions. A negative result may occur with  improper specimen collection/handling, submission of specimen other than nasopharyngeal swab, presence of viral mutation(s) within the areas targeted by this assay, and inadequate number of viral copies(<138 copies/mL). A negative result must be combined with clinical observations, patient history, and epidemiological information. The expected result is Negative.  Fact Sheet for Patients:  BloggerCourse.com  Fact Sheet for Healthcare Providers:   SeriousBroker.it  This test is no t yet approved or cleared by the [place] FDA and  has been authorized for detection and/or diagnosis of SARS-CoV-2 by FDA under an Emergency Use Authorization (EUA). This EUA will remain  in effect (meaning this test can be used) for the duration of the COVID-19 declaration under Section 564(b)(1) of the Act, 21 U.S.C.section 360bbb-3(b)(1), unless the authorization is terminated  or revoked sooner.       Influenza A by PCR NEGATIVE NEGATIVE Final   Influenza B by PCR NEGATIVE NEGATIVE Final    Comment: (NOTE) The Xpert Xpress SARS-CoV-2/FLU/RSV plus assay is intended as an aid in the diagnosis of influenza from Nasopharyngeal swab specimens and should not be used as a sole basis for treatment. Nasal washings and aspirates are unacceptable for Xpert Xpress SARS-CoV-2/FLU/RSV testing.  Fact Sheet for Patients: BloggerCourse.com  Fact Sheet for Healthcare Providers: SeriousBroker.it  This test is not yet approved or cleared by the [place] FDA and has been authorized for detection and/or diagnosis of SARS-CoV-2 by FDA under an Emergency Use Authorization (EUA). This EUA will remain in effect (meaning this test can be used) for the duration of the COVID-19 declaration under Section 564(b)(1) of the Act, 21 U.S.C. section 360bbb-3(b)(1), unless the authorization is terminated or revoked.  Performed at [facility], [address] [place], [place] [address]   Culture, blood (routine x 2)     Status: None (Preliminary result)   Collection Time: 04/11/20 10:50 AM   Specimen: BLOOD  Result Value Ref Range Status   Specimen Description BLOOD LEFT ANTECUBITAL  Final   Special  Requests   Final    BOTTLES DRAWN AEROBIC AND ANAEROBIC Blood Culture adequate volume   Culture   Final    NO GROWTH 3 DAYS Performed at [facility], [address],  [place], [place] [address]    Report Status PENDING  Incomplete  Culture, blood (routine x 2)     Status: None (Preliminary result)   Collection Time: 04/11/20 11:00 AM   Specimen: BLOOD RIGHT FOREARM  Result Value Ref Range Status   Specimen Description BLOOD RIGHT FOREARM  Final   Special Requests   Final    BOTTLES DRAWN AEROBIC AND ANAEROBIC Blood Culture adequate volume   Culture   Final    NO GROWTH 3 DAYS Performed at [facility], [address], [place], [place] [address]    Report Status PENDING  Incomplete     Labs: BNP (last 3 results) No results for input(s): BNP in the last 8760 hours. Basic Metabolic Panel: Recent Labs  Lab 04/11/20 1030  NA 144  K 3.8  CL 106  CO2 22  GLUCOSE 190*  BUN 25*  CREATININE 1.02*  CALCIUM 9.9   Liver Function Tests: Recent Labs  Lab 04/11/20 1030  AST 20  ALT 11  ALKPHOS 62  BILITOT 0.9  PROT 7.6  ALBUMIN 3.2*   Recent Labs  Lab 04/11/20 1030  LIPASE 48   No results for input(s): AMMONIA in the last 168 hours. CBC: Recent Labs  Lab 04/11/20 1030  WBC 6.6  NEUTROABS 5.5  HGB 12.4  HCT 37.5  MCV 91.2  PLT 352   Cardiac Enzymes: No results for input(s): CKTOTAL, CKMB, CKMBINDEX, TROPONINI in the last 168 hours. BNP: Invalid input(s): POCBNP CBG: No results for input(s): GLUCAP in the last 168 hours. D-Dimer No results for input(s): DDIMER in the last 72 hours. Hgb A1c No results for input(s): HGBA1C in the last 72 hours. Lipid Profile No results for input(s): CHOL, HDL, LDLCALC, TRIG, CHOLHDL, LDLDIRECT in the last 72 hours. Thyroid function studies No results for input(s): TSH, T4TOTAL, T3FREE, THYROIDAB in the last 72 hours.  Invalid input(s): FREET3 Anemia work up No results for input(s): VITAMINB12, FOLATE, FERRITIN, TIBC, IRON, RETICCTPCT in the last 72 hours. Urinalysis    Component Value Date/Time   COLORURINE YELLOW 03/24/2020 1350   APPEARANCEUR HAZY (A) 03/24/2020 1350   LABSPEC  1.019 03/24/2020 1350   PHURINE 5.0 03/24/2020 1350   GLUCOSEU NEGATIVE 03/24/2020 1350   HGBUR NEGATIVE 03/24/2020 1350   BILIRUBINUR NEGATIVE 03/24/2020 1350   KETONESUR NEGATIVE 03/24/2020 1350   PROTEINUR 100 (A) 03/24/2020 1350   UROBILINOGEN 0.2 02/21/2015 2355   NITRITE NEGATIVE 03/24/2020 1350   LEUKOCYTESUR NEGATIVE 03/24/2020 1350   Sepsis Labs Invalid input(s): PROCALCITONIN,  WBC,  LACTICIDVEN Microbiology Recent Results (from the past 240 hour(s))  Resp Panel by RT-PCR (Flu A&B, Covid) Nasopharyngeal Swab     Status: None   Collection Time: 04/11/20 10:45 AM   Specimen: Nasopharyngeal Swab; Nasopharyngeal(NP) swabs in vial transport medium  Result Value Ref Range Status   SARS Coronavirus 2 by RT PCR NEGATIVE NEGATIVE Final    Comment: (NOTE) SARS-CoV-2 target nucleic acids are NOT DETECTED.  The SARS-CoV-2 RNA is generally detectable in upper respiratory specimens during the acute phase of infection. The lowest concentration of SARS-CoV-2 viral copies this assay can detect is 138 copies/mL. A negative result does not preclude SARS-Cov-2 infection and should not be used as the sole basis for treatment or  other patient management decisions. A negative result may occur with  improper specimen collection/handling, submission of specimen other than nasopharyngeal swab, presence of viral mutation(s) within the areas targeted by this assay, and inadequate number of viral copies(<138 copies/mL). A negative result must be combined with clinical observations, patient history, and epidemiological information. The expected result is Negative.  Fact Sheet for Patients:  BloggerCourse.comhttps://www.fda.gov/media/152166/download  Fact Sheet for Healthcare Providers:  SeriousBroker.ithttps://www.fda.gov/media/152162/download  This test is no t yet approved or cleared by the [place] FDA and  has been authorized for detection and/or diagnosis of SARS-CoV-2 by FDA under an Emergency Use Authorization  (EUA). This EUA will remain  in effect (meaning this test can be used) for the duration of the COVID-19 declaration under Section 564(b)(1) of the Act, 21 U.S.C.section 360bbb-3(b)(1), unless the authorization is terminated  or revoked sooner.       Influenza A by PCR NEGATIVE NEGATIVE Final   Influenza B by PCR NEGATIVE NEGATIVE Final    Comment: (NOTE) The Xpert Xpress SARS-CoV-2/FLU/RSV plus assay is intended as an aid in the diagnosis of influenza from Nasopharyngeal swab specimens and should not be used as a sole basis for treatment. Nasal washings and aspirates are unacceptable for Xpert Xpress SARS-CoV-2/FLU/RSV testing.  Fact Sheet for Patients: BloggerCourse.comhttps://www.fda.gov/media/152166/download  Fact Sheet for Healthcare Providers: SeriousBroker.ithttps://www.fda.gov/media/152162/download  This test is not yet approved or cleared by the [place] FDA and has been authorized for detection and/or diagnosis of SARS-CoV-2 by FDA under an Emergency Use Authorization (EUA). This EUA will remain in effect (meaning this test can be used) for the duration of the COVID-19 declaration under Section 564(b)(1) of the Act, 21 U.S.C. section 360bbb-3(b)(1), unless the authorization is terminated or revoked.  Performed at [facility], [address] [place], [place] [address]   Culture, blood (routine x 2)     Status: None (Preliminary result)   Collection Time: 04/11/20 10:50 AM   Specimen: BLOOD  Result Value Ref Range Status   Specimen Description BLOOD LEFT ANTECUBITAL  Final   Special Requests   Final    BOTTLES DRAWN AEROBIC AND ANAEROBIC Blood Culture adequate volume   Culture   Final    NO GROWTH 3 DAYS Performed at [facility], [address] [place], [place] [address]    Report Status PENDING  Incomplete  Culture, blood (routine x 2)     Status: None (Preliminary result)   Collection Time: 04/11/20 11:00 AM   Specimen: BLOOD RIGHT FOREARM  Result Value Ref Range Status    Specimen Description BLOOD RIGHT FOREARM  Final   Special Requests   Final    BOTTLES DRAWN AEROBIC AND ANAEROBIC Blood Culture adequate volume   Culture   Final    NO GROWTH 3 DAYS Performed at [facility], [address] [place], [place] [address]    Report Status PENDING  Incomplete     Time coordinating discharge: 40 minutes  SIGNED:   Alba CoryBelkys A Micheala Morissette, MD  [facility]

## 2020-04-14 NOTE — Progress Notes (Signed)
Palliative Medicine RN Note: Afternoon symptom check.   Pt is weak and confused. She verbalizes being thirsty but declined all fluids offered except diet coke. I brought Diet Coke, and she only drank two sips then said that was enough. I offered to feed her any of the dinner on the tray that was hot on her bedside table, and she declined to eat anything.  She denied pain but also denied feeling good. She just is "blah." I will follow up tomorrow to ensure she has not developed any new symptoms.  Carolyn Chance Ocia Simek, RN, BSN, [facility] Palliative Medicine Team 04/14/2020 5:21 PM Office [phone]

## 2020-04-14 NOTE — Progress Notes (Signed)
[employer], [employer] [facility]) Hospital Liaison note.    Chart and pt information under review by [facility] physician.  Hospice IPU eligibility pending at this time.  [facility] is unable to offer a room today. Hospital Liaison will follow up tomorrow or sooner if a room becomes available. Please do not hesitate to call with questions.    Thank you for the opportunity to participate in this patient's care.  Chrislyn Brooke Dare, BSN, RN [facility] Liaison (listed on AMION under Hospice[facility])    [phone]

## 2020-04-15 NOTE — Progress Notes (Signed)
Civil engineer, contracting [facility]) Hospital Liaison note.    Pt has been confirmed as eligible for residential hospice by [facility] MD. Visited pt at bedside. Pt was resting comfortably with even/unlabored respirations, did not wake to her name.  [facility] is unable to offer a room today. Hospital Liaison will follow up tomorrow or sooner if a room becomes available. Please do not hesitate to call with questions.    Thank you for the opportunity to participate in this patients care.  Chrislyn Brooke Dare, BSN, RN [facility] Liaison (listed on [facility]ION under Hospice[facility])    [phone]

## 2020-04-15 NOTE — Progress Notes (Signed)
Palliative Medicine RN Note: Symptom check.  Pt's symptoms remain controlled by current regimen, except for dizziness. RN will check to ensure pt has no meds due. Pt denies pain & trouble breathing, and there are no objective signs of either at the time of my visit.   PMT will follow up tomorrow if she does not get a hospice bed.  Margret Chance Analeigha Nauman, RN, BSN, [facility] Palliative Medicine Team 04/15/2020 4:06 PM Office [phone]

## 2020-04-15 NOTE — Care Management Important Message (Signed)
Important Message  Patient Details  Name: Carolyn Bishop MRN: [id number] Date of Birth: 1932/08/28   Medicare Important Message Given:  Yes - Important Message mailed due to current National Emergency   Verbal consent obtained due to current National Emergency  Relationship to patient: Self Contact Name: [name] Call Date: 04/15/20  Time: 1431 Phone: [phone] Outcome: No Answer/Busy Important Message mailed to: Patient address on file    Orson Aloe 04/15/2020, 2:32 PM

## 2020-04-15 NOTE — Progress Notes (Signed)
PROGRESS NOTE    Carolyn Bishop  CVE:[id number] DOB: 1932-10-09 DOA: 04/11/2020 PCP: Sharon Seller, NP   Brief Narrative: 86 year old with past medical history significant for advanced dementia, hypertension, hyperlipidemia, recent diagnosis of right thigh DVT on Eliquis presenting with decreased oral intake, worsening confusion.  Patient baseline has dementia and significant confusion unable to provide any history.  Patient niece has been her only caregiver, who report patient has had frequent confusion, since her last hospitalization 4 weeks ago.  Her oral intake has decreased significantly over the last 2 weeks.  Over the last 3 days patient has stopped eating and drinking altogether and has been more sleepy throughout the day.  Palliative care was consulted for goals of care, Family decided to proceed with comfort care. Plan for residential hospice facility.    Assessment & Plan:   Active Problems:   Dehydration   1-FTT;  Likely related to further decline of her dementia. Palliative care following, family opted for Comfort care. Plan for residential hospice facility   Continue with Ensure, trial of Marinol. Fluids discontinue.  Blood Cultures no growth.  CT head no acute intracranial abnormality Patient with poor oral intake.   2-Right side DVT: Continue with Eliquis  3-Hypertension: hold amlodipine.  Continue with IV hydralazine as needed. 4-advanced dementia: Supportive care    Pressure Injury 03/10/20 Sacrum Medial Stage 2 -  Partial thickness loss of dermis presenting as a shallow open injury with a red, pink wound bed without slough. (Active)  03/10/20 1734  Location: Sacrum  Location Orientation: Medial  Staging: Stage 2 -  Partial thickness loss of dermis presenting as a shallow open injury with a red, pink wound bed without slough.  Wound Description (Comments):   Present on Admission: Yes    Estimated body mass index is 24.8 kg/m as calculated from the  following:   Height as of 03/10/20: 5\' 3"  (1.6 m).   Weight as of this encounter: 63.5 kg.   DVT prophylaxis: Eliquis   Code Status: DNR Family Communication: family updated 12/06 Disposition Plan:  Status is: Inpatient  Remains inpatient appropriate because:IV treatments appropriate due to intensity of illness or inability to take PO   Dispo: The patient is from: Home              Anticipated d/c is to: Residential hospice facility              Anticipated d/c date is: 1 day              Patient currently is medically stable to d/c.  Awaiting residential hospice bed        Consultants:   Palliative care  Procedures:   none  Antimicrobials:    Subjective: Sleepy. Poor oral intake.   Objective: Vitals:   04/12/20 0919 04/12/20 2055 04/13/20 0535 04/14/20 1016  BP: (!) 156/78 140/63 (!) 166/63 136/80  Pulse: 88 76 70 73  Resp: 16 20 16 20   Temp: 98 F (36.7 C) 98.1 F (36.7 C) 98.3 F (36.8 C) 98.7 F (37.1 C)  TempSrc: Oral Oral Oral Oral  SpO2: 100% 97% 97% 98%  Weight:  63.5 kg      Intake/Output Summary (Last 24 hours) at 04/15/2020 1646 Last data filed at 04/15/2020 1300 Gross per 24 hour  Intake 300 ml  Output 250 ml  Net 50 ml   Filed Weights   04/11/20 2104 04/12/20 2055  Weight: 61.7 kg 63.5 kg    Examination:  General  exam: NAD Respiratory system: CTA Cardiovascular system: S 1, S 2 RRR Gastrointestinal system: BS present, soft, nt Central nervous system: Sleepy  Extremities: No edema  Data Reviewed: I have personally reviewed following labs and imaging studies  CBC: Recent Labs  Lab 04/11/20 1030  WBC 6.6  NEUTROABS 5.5  HGB 12.4  HCT 37.5  MCV 91.2  PLT 352   Basic Metabolic Panel: Recent Labs  Lab 04/11/20 1030  NA 144  K 3.8  CL 106  CO2 22  GLUCOSE 190*  BUN 25*  CREATININE 1.02*  CALCIUM 9.9   GFR: Estimated Creatinine Clearance: 36.2 mL/min (A) (by C-G formula based on SCr of 1.02 mg/dL (H)). Liver  Function Tests: Recent Labs  Lab 04/11/20 1030  AST 20  ALT 11  ALKPHOS 62  BILITOT 0.9  PROT 7.6  ALBUMIN 3.2*   Recent Labs  Lab 04/11/20 1030  LIPASE 48   No results for input(s): AMMONIA in the last 168 hours. Coagulation Profile: Recent Labs  Lab 04/11/20 1030  INR 1.1   Cardiac Enzymes: No results for input(s): CKTOTAL, CKMB, CKMBINDEX, TROPONINI in the last 168 hours. BNP (last 3 results) No results for input(s): PROBNP in the last 8760 hours. HbA1C: No results for input(s): HGBA1C in the last 72 hours. CBG: No results for input(s): GLUCAP in the last 168 hours. Lipid Profile: No results for input(s): CHOL, HDL, LDLCALC, TRIG, CHOLHDL, LDLDIRECT in the last 72 hours. Thyroid Function Tests: No results for input(s): TSH, T4TOTAL, FREET4, T3FREE, THYROIDAB in the last 72 hours. Anemia Panel: No results for input(s): VITAMINB12, FOLATE, FERRITIN, TIBC, IRON, RETICCTPCT in the last 72 hours. Sepsis Labs: Recent Labs  Lab 04/11/20 1030 04/11/20 1244  LATICACIDVEN 1.5 1.5    Recent Results (from the past 240 hour(s))  Resp Panel by RT-PCR (Flu A&B, Covid) Nasopharyngeal Swab     Status: None   Collection Time: 04/11/20 10:45 AM   Specimen: Nasopharyngeal Swab; Nasopharyngeal(NP) swabs in vial transport medium  Result Value Ref Range Status   SARS Coronavirus 2 by RT PCR NEGATIVE NEGATIVE Final    Comment: (NOTE) SARS-CoV-2 target nucleic acids are NOT DETECTED.  The SARS-CoV-2 RNA is generally detectable in upper respiratory specimens during the acute phase of infection. The lowest concentration of SARS-CoV-2 viral copies this assay can detect is 138 copies/mL. A negative result does not preclude SARS-Cov-2 infection and should not be used as the sole basis for treatment or other patient management decisions. A negative result may occur with  improper specimen collection/handling, submission of specimen other than nasopharyngeal swab, presence of viral  mutation(s) within the areas targeted by this assay, and inadequate number of viral copies(<138 copies/mL). A negative result must be combined with clinical observations, patient history, and epidemiological information. The expected result is Negative.  Fact Sheet for Patients:  BloggerCourse.com  Fact Sheet for Healthcare Providers:  SeriousBroker.it  This test is no t yet approved or cleared by the [place] FDA and  has been authorized for detection and/or diagnosis of SARS-CoV-2 by FDA under an Emergency Use Authorization (EUA). This EUA will remain  in effect (meaning this test can be used) for the duration of the COVID-19 declaration under Section 564(b)(1) of the Act, 21 U.S.C.section 360bbb-3(b)(1), unless the authorization is terminated  or revoked sooner.       Influenza A by PCR NEGATIVE NEGATIVE Final   Influenza B by PCR NEGATIVE NEGATIVE Final    Comment: (NOTE) The Xpert Xpress SARS-CoV-2/FLU/RSV plus  assay is intended as an aid in the diagnosis of influenza from Nasopharyngeal swab specimens and should not be used as a sole basis for treatment. Nasal washings and aspirates are unacceptable for Xpert Xpress SARS-CoV-2/FLU/RSV testing.  Fact Sheet for Patients: BloggerCourse.com  Fact Sheet for Healthcare Providers: SeriousBroker.it  This test is not yet approved or cleared by the [place] FDA and has been authorized for detection and/or diagnosis of SARS-CoV-2 by FDA under an Emergency Use Authorization (EUA). This EUA will remain in effect (meaning this test can be used) for the duration of the COVID-19 declaration under Section 564(b)(1) of the Act, 21 U.S.C. section 360bbb-3(b)(1), unless the authorization is terminated or revoked.  Performed at [facility], [address], [place], [place] [address]   Culture, blood (routine x 2)      Status: None (Preliminary result)   Collection Time: 04/11/20 10:50 AM   Specimen: BLOOD  Result Value Ref Range Status   Specimen Description BLOOD LEFT ANTECUBITAL  Final   Special Requests   Final    BOTTLES DRAWN AEROBIC AND ANAEROBIC Blood Culture adequate volume   Culture   Final    NO GROWTH 4 DAYS Performed at [facility], [address] [place], [place] [address]    Report Status PENDING  Incomplete  Culture, blood (routine x 2)     Status: None (Preliminary result)   Collection Time: 04/11/20 11:00 AM   Specimen: BLOOD RIGHT FOREARM  Result Value Ref Range Status   Specimen Description BLOOD RIGHT FOREARM  Final   Special Requests   Final    BOTTLES DRAWN AEROBIC AND ANAEROBIC Blood Culture adequate volume   Culture   Final    NO GROWTH 4 DAYS Performed at [facility], [address], [place], [place] [address]    Report Status PENDING  Incomplete         Radiology Studies: No results found.      Scheduled Meds: . antiseptic oral rinse  15 mL Topical BID  . apixaban  5 mg Oral BID  . dronabinol  2.5 mg Oral BID AC  . memantine  10 mg Oral BID  . neomycin-bacitracin-polymyxin   Topical BID  . senna-docusate  1 tablet Oral BID   Continuous Infusions:    LOS: 4 days    Time spent: 35 minutes    Equilla Que A Luciana Cammarata, MD [facility]   If 7PM-7AM, please contact night-coverage [email or web address]  04/15/2020, 4:46 PM

## 2020-04-16 LAB — CULTURE, BLOOD (ROUTINE X 2)
Culture: NO GROWTH
Culture: NO GROWTH
Special Requests: ADEQUATE
Special Requests: ADEQUATE

## 2020-04-16 MED ORDER — MORPHINE SULFATE (CONCENTRATE) 10 MG/0.5ML PO SOLN: 10.0000 mg | ORAL | Status: DC | PRN

## 2020-04-16 MED ORDER — LORAZEPAM 2 MG/ML IJ SOLN: 2.0000 mg | INTRAMUSCULAR | Status: DC | PRN

## 2020-04-16 MED ORDER — LORAZEPAM 2 MG/ML PO CONC: 1.0000 mg | ORAL | Status: DC | PRN

## 2020-04-16 NOTE — Progress Notes (Signed)
PROGRESS NOTE    Carolyn Bishop  IOE:[id number] DOB: [birth year] DOA: 04/11/2020 PCP: Sharon Seller, NP   Brief Narrative: 86 year old with past medical history significant for advanced dementia, hypertension, hyperlipidemia, recent diagnosis of right thigh DVT on Eliquis presenting with decreased oral intake, worsening confusion.  Patient baseline has dementia and significant confusion unable to provide any history.  Patient niece has been her only caregiver, who report patient has had frequent confusion, since her last hospitalization 4 weeks ago.  Her oral intake has decreased significantly over the last 2 weeks.  Over the last 3 days patient has stopped eating and drinking altogether and has been more sleepy throughout the day.  Palliative care was consulted for goals of care, Family decided to proceed with comfort care. Plan for residential hospice facility.    Assessment & Plan:   Active Problems:   Dehydration   1-FTT;  Likely related to further decline of her dementia. Palliative care following, family opted for Comfort care. Plan for residential hospice facility   Continue with Ensure, trial of Marinol. Fluids discontinue.  Blood Cultures no growth.  CT head no acute intracranial abnormality Continue to have poor oral intake.  Await bed at residential hospice facility.   2-Right side DVT: Continue with Eliquis  3-Hypertension: hold amlodipine.  Continue with IV hydralazine as needed. 4-advanced dementia: Supportive care    Pressure Injury 03/10/20 Sacrum Medial Stage 2 -  Partial thickness loss of dermis presenting as a shallow open injury with a red, pink wound bed without slough. (Active)  03/10/20 1734  Location: Sacrum  Location Orientation: Medial  Staging: Stage 2 -  Partial thickness loss of dermis presenting as a shallow open injury with a red, pink wound bed without slough.  Wound Description (Comments):   Present on Admission: Yes    Estimated body  mass index is 25.69 kg/m as calculated from the following:   Height as of 03/10/20: 5\' 3"  (1.6 m).   Weight as of this encounter: 65.8 kg.   DVT prophylaxis: Eliquis   Code Status: DNR Family Communication: family updated 12/06 Disposition Plan:  Status is: Inpatient  Remains inpatient appropriate because:IV treatments appropriate due to intensity of illness or inability to take PO   Dispo: The patient is from: Home              Anticipated d/c is to: Residential hospice facility              Anticipated d/c date is: 1 day              Patient currently is medically stable to d/c.  Awaiting residential hospice bed        Consultants:   Palliative care  Procedures:   none  Antimicrobials:    Subjective: She was alert during my visit early this am. She was feeling well.   Objective: Vitals:   04/14/20 1016 04/15/20 1646 04/15/20 2216 04/16/20 0532  BP: 136/80 (!) 166/68 (!) 149/63 (!) 168/66  Pulse: 73 67 64 66  Resp: 20 17 16 16   Temp: 98.7 F (37.1 C) 98 F (36.7 C) 98.1 F (36.7 C) 99.7 F (37.6 C)  TempSrc: Oral  Oral Oral  SpO2: 98% 100% 100% 100%  Weight:   65.8 kg     Intake/Output Summary (Last 24 hours) at 04/16/2020 1425 Last data filed at 04/16/2020 0600 Gross per 24 hour  Intake 100 ml  Output 600 ml  Net -500 ml   14/12/2019  04/11/20 2104 04/12/20 2055 04/15/20 2216  Weight: 61.7 kg 63.5 kg 65.8 kg    Examination:  General exam: NAD Respiratory system: CTA Cardiovascular system: S 1, S 2 RRR Gastrointestinal system: BS present, soft, nt Central nervous system: alert Extremities: No edema  Data Reviewed: I have personally reviewed following labs and imaging studies  CBC: Recent Labs  Lab 04/11/20 1030  WBC 6.6  NEUTROABS 5.5  HGB 12.4  HCT 37.5  MCV 91.2  PLT 352   Basic Metabolic Panel: Recent Labs  Lab 04/11/20 1030  NA 144  K 3.8  CL 106  CO2 22  GLUCOSE 190*  BUN 25*  CREATININE 1.02*  CALCIUM 9.9    GFR: Estimated Creatinine Clearance: 36.8 mL/min (A) (by C-G formula based on SCr of 1.02 mg/dL (H)). Liver Function Tests: Recent Labs  Lab 04/11/20 1030  AST 20  ALT 11  ALKPHOS 62  BILITOT 0.9  PROT 7.6  ALBUMIN 3.2*   Recent Labs  Lab 04/11/20 1030  LIPASE 48   No results for input(s): AMMONIA in the last 168 hours. Coagulation Profile: Recent Labs  Lab 04/11/20 1030  INR 1.1   Cardiac Enzymes: No results for input(s): CKTOTAL, CKMB, CKMBINDEX, TROPONINI in the last 168 hours. BNP (last 3 results) No results for input(s): PROBNP in the last 8760 hours. HbA1C: No results for input(s): HGBA1C in the last 72 hours. CBG: No results for input(s): GLUCAP in the last 168 hours. Lipid Profile: No results for input(s): CHOL, HDL, LDLCALC, TRIG, CHOLHDL, LDLDIRECT in the last 72 hours. Thyroid Function Tests: No results for input(s): TSH, T4TOTAL, FREET4, T3FREE, THYROIDAB in the last 72 hours. Anemia Panel: No results for input(s): VITAMINB12, FOLATE, FERRITIN, TIBC, IRON, RETICCTPCT in the last 72 hours. Sepsis Labs: Recent Labs  Lab 04/11/20 1030 04/11/20 1244  LATICACIDVEN 1.5 1.5    Recent Results (from the past 240 hour(s))  Resp Panel by RT-PCR (Flu A&B, Covid) Nasopharyngeal Swab     Status: None   Collection Time: 04/11/20 10:45 AM   Specimen: Nasopharyngeal Swab; Nasopharyngeal(NP) swabs in vial transport medium  Result Value Ref Range Status   SARS Coronavirus 2 by RT PCR NEGATIVE NEGATIVE Final    Comment: (NOTE) SARS-CoV-2 target nucleic acids are NOT DETECTED.  The SARS-CoV-2 RNA is generally detectable in upper respiratory specimens during the acute phase of infection. The lowest concentration of SARS-CoV-2 viral copies this assay can detect is 138 copies/mL. A negative result does not preclude SARS-Cov-2 infection and should not be used as the sole basis for treatment or other patient management decisions. A negative result may occur with   improper specimen collection/handling, submission of specimen other than nasopharyngeal swab, presence of viral mutation(s) within the areas targeted by this assay, and inadequate number of viral copies(<138 copies/mL). A negative result must be combined with clinical observations, patient history, and epidemiological information. The expected result is Negative.  Fact Sheet for Patients:  BloggerCourse.com  Fact Sheet for Healthcare Providers:  SeriousBroker.it  This test is no t yet approved or cleared by the [place] FDA and  has been authorized for detection and/or diagnosis of SARS-CoV-2 by FDA under an Emergency Use Authorization (EUA). This EUA will remain  in effect (meaning this test can be used) for the duration of the COVID-19 declaration under Section 564(b)(1) of the Act, 21 U.S.C.section 360bbb-3(b)(1), unless the authorization is terminated  or revoked sooner.       Influenza A by PCR NEGATIVE NEGATIVE Final  Influenza B by PCR NEGATIVE NEGATIVE Final    Comment: (NOTE) The Xpert Xpress SARS-CoV-2/FLU/RSV plus assay is intended as an aid in the diagnosis of influenza from Nasopharyngeal swab specimens and should not be used as a sole basis for treatment. Nasal washings and aspirates are unacceptable for Xpert Xpress SARS-CoV-2/FLU/RSV testing.  Fact Sheet for Patients: BloggerCourse.com  Fact Sheet for Healthcare Providers: SeriousBroker.it  This test is not yet approved or cleared by the [place] FDA and has been authorized for detection and/or diagnosis of SARS-CoV-2 by FDA under an Emergency Use Authorization (EUA). This EUA will remain in effect (meaning this test can be used) for the duration of the COVID-19 declaration under Section 564(b)(1) of the Act, 21 U.S.C. section 360bbb-3(b)(1), unless the authorization is terminated  or revoked.  Performed at [facility], [address], [place], [place] [address]   Culture, blood (routine x 2)     Status: None   Collection Time: 04/11/20 10:50 AM   Specimen: BLOOD  Result Value Ref Range Status   Specimen Description BLOOD LEFT ANTECUBITAL  Final   Special Requests   Final    BOTTLES DRAWN AEROBIC AND ANAEROBIC Blood Culture adequate volume   Culture   Final    NO GROWTH 5 DAYS Performed at [facility], [address], [place], [place] [address]    Report Status 04/16/2020 FINAL  Final  Culture, blood (routine x 2)     Status: None   Collection Time: 04/11/20 11:00 AM   Specimen: BLOOD RIGHT FOREARM  Result Value Ref Range Status   Specimen Description BLOOD RIGHT FOREARM  Final   Special Requests   Final    BOTTLES DRAWN AEROBIC AND ANAEROBIC Blood Culture adequate volume   Culture   Final    NO GROWTH 5 DAYS Performed at [facility], [address], [place], [place] [address]    Report Status 04/16/2020 FINAL  Final         Radiology Studies: No results found.      Scheduled Meds: . antiseptic oral rinse  15 mL Topical BID  . apixaban  5 mg Oral BID  . dronabinol  2.5 mg Oral BID AC  . memantine  10 mg Oral BID  . neomycin-bacitracin-polymyxin   Topical BID  . senna-docusate  1 tablet Oral BID   Continuous Infusions:    LOS: 5 days    Time spent: 35 minutes    Georges Victorio A Ivalene Platte, MD [facility]   If 7PM-7AM, please contact night-coverage [email or web address]  04/16/2020, 2:25 PM

## 2020-04-16 NOTE — Plan of Care (Signed)
  Problem: Safety: Goal: Ability to remain free from injury will improve Outcome: Progressing   

## 2020-04-16 NOTE — Progress Notes (Signed)
Civil engineer, contracting [facility]) Hospital Liaison note.        Received request from [facility] manager for family interest in [facility]. Patient information has been forwarded to [facility] for review.  Eligibility confirmed.       [facility] Hospital Liaison will follow up tomorrow or sooner if a room becomes available.     A Please do not hesitate to call with questions.     Thank you,     Elsie Saas, RN, [facility]        [facility] Liaison (listed on AMION under Hospice [facility])      [phone]

## 2020-04-16 NOTE — Progress Notes (Signed)
Palliative Medicine RN Note: Symptom check.  RN notes IV is not working as well. Obtained orders for IM/SL meds in case of seizure or discomfort. Noted that she was unable to take her am po meds. Hesitate to stop them, as she was still aware she has to take them, and it comforts her to have the ritual; we will reassess this tomorrow. She did not wake today during my visit like she has previous, so I expect that this is the beginning of her actively dying phase.  Plan to f/u tomorrow if she is still alive.  Margret Chance Kylie Gros, RN, BSN, [facility] Palliative Medicine Team 04/16/2020 2:10 PM Office [phone]

## 2020-04-17 DIAGNOSIS — G309 Alzheimer's disease, unspecified: Secondary | ICD-10-CM

## 2020-04-17 DIAGNOSIS — I10 Essential (primary) hypertension: Secondary | ICD-10-CM

## 2020-04-17 DIAGNOSIS — I82491 Acute embolism and thrombosis of other specified deep vein of right lower extremity: Secondary | ICD-10-CM

## 2020-04-17 DIAGNOSIS — F0280 Dementia in other diseases classified elsewhere without behavioral disturbance: Secondary | ICD-10-CM

## 2020-04-17 DIAGNOSIS — R627 Adult failure to thrive: Secondary | ICD-10-CM

## 2020-04-17 NOTE — Progress Notes (Signed)
[facility] Hospital Liaison Note  Unfortunately [facility] is not able to offer a room today.   Family and CSW are aware [facility] liaison will follow up with CSW and family tomorrow or sooner if room becomes available.  Please do not hesitate to call with questions.  Thank you.   Sherry Gibson, RN [facility] Hospital Liaison  [phone]      

## 2020-04-17 NOTE — Plan of Care (Signed)
  Problem: Nutrition: Goal: Adequate nutrition will be maintained Outcome: Progressing   Problem: Elimination: Goal: Will not experience complications related to urinary retention Outcome: Not Progressing

## 2020-04-17 NOTE — Progress Notes (Addendum)
PROGRESS NOTE    Carolyn Bishop  YQM:[id number] DOB: 1932/09/23 DOA: 04/11/2020 PCP: Sharon Seller, NP   Brief Narrative:  86 year old with past medical history significant for advanced dementia, hypertension, hyperlipidemia, recent diagnosis of right thigh DVT on Eliquis presented to hospital with decreased oral intake, worsening confusion.  Patient baseline has dementia and significant confusion and was initially unable to provide any history.  Patient's niece has been her only caregiver, who reported that patient has had frequent confusion, since her last hospitalization 4 weeks ago.  Her oral intake has decreased significantly over 2 weeks presentation. Three days prior to admission, tonight he stopped eating and drinking altogether and was more sleepy. Palliative care was consulted for goals of care, Family decided to proceed with hospice care  Assessment & Plan:   Principal Problem:   Failure to thrive in adult Active Problems:   Essential hypertension   DVT (deep venous thrombosis) (HCC)   Dehydration   Dementia without behavioral disturbance (HCC)  Failure to thrive secondary to advanced dementia. Palliative care on board and recommend comfort care residential hospice.  Continue Ensure, Marinol.  Blood cultures initial were negative.  CT head was unremarkable.  Patient continues to have poor oral intake.  Awaiting for bed at her residential facility.  Right lower extremity DVT:  On eliquis  Hypertension: hold amlodipine.   advanced dementia: Supportive care   DVT prophylaxis: Eliquis   Code Status: DNR  Family Communication:  None today.  Disposition Plan:  Status is: Inpatient  Remains inpatient appropriate because: Awaiting for residential hospice  Dispo: The patient is from: Home              Anticipated d/c is to: Residential hospice facility              Anticipated d/c date is: 1 day or when bed is available              Patient currently is medically  stable to d/c.  Awaiting residential hospice bed  Consultants:   Palliative care  Procedures:   none  Antimicrobials:  None  Subjective: She was alert during my visit early this am. She was feeling well.   Objective: Vitals:   04/15/20 2216 04/16/20 0532 04/17/20 0036 04/17/20 0927  BP: (!) 149/63 (!) 168/66 (!) 164/94 (!) 144/85  Pulse: 64 66 68 83  Resp: 16 16 16 18   Temp: 98.1 F (36.7 C) 99.7 F (37.6 C) 97.8 F (36.6 C) 99.4 F (37.4 C)  TempSrc: Oral Oral Oral Oral  SpO2: 100% 100% 92% 100%  Weight: 65.8 kg       Intake/Output Summary (Last 24 hours) at 04/17/2020 1349 Last data filed at 04/17/2020 1243 Gross per 24 hour  Intake 730 ml  Output 750 ml  Net -20 ml   Filed Weights   04/11/20 2104 04/12/20 2055 04/15/20 2216  Weight: 61.7 kg 63.5 kg 65.8 kg   Body mass index is 25.69 kg/m.   Physical examination:  General: Thinly built,, not in obvious distress, communicative, underlying dementia HENT:   No scleral pallor or icterus noted. Oral mucosa is moist.  Chest:    Diminished breath sounds bilaterally. No crackles or wheezes.  CVS: S1 &S2 heard. No murmur.  Regular rate and rhythm. Abdomen: Soft, nontender, nondistended.  Bowel sounds are heard.   Extremities: No cyanosis, clubbing or edema.  Peripheral pulses are palpable. Psych: Alert, awake and communicative, underlying dementia CNS:  No cranial nerve deficits.  Moving  the extremities Skin: Warm and dry. stage II sacral decubitus ulceration  Data Reviewed: I have personally reviewed following labs and imaging studies  CBC: Recent Labs  Lab 04/11/20 1030  WBC 6.6  NEUTROABS 5.5  HGB 12.4  HCT 37.5  MCV 91.2  PLT 352   Basic Metabolic Panel: Recent Labs  Lab 04/11/20 1030  NA 144  K 3.8  CL 106  CO2 22  GLUCOSE 190*  BUN 25*  CREATININE 1.02*  CALCIUM 9.9   GFR: Estimated Creatinine Clearance: 36.8 mL/min (A) (by C-G formula based on SCr of 1.02 mg/dL (H)). Liver Function  Tests: Recent Labs  Lab 04/11/20 1030  AST 20  ALT 11  ALKPHOS 62  BILITOT 0.9  PROT 7.6  ALBUMIN 3.2*   Recent Labs  Lab 04/11/20 1030  LIPASE 48   No results for input(s): AMMONIA in the last 168 hours. Coagulation Profile: Recent Labs  Lab 04/11/20 1030  INR 1.1   Cardiac Enzymes: No results for input(s): CKTOTAL, CKMB, CKMBINDEX, TROPONINI in the last 168 hours. BNP (last 3 results) No results for input(s): PROBNP in the last 8760 hours. HbA1C: No results for input(s): HGBA1C in the last 72 hours. CBG: No results for input(s): GLUCAP in the last 168 hours. Lipid Profile: No results for input(s): CHOL, HDL, LDLCALC, TRIG, CHOLHDL, LDLDIRECT in the last 72 hours. Thyroid Function Tests: No results for input(s): TSH, T4TOTAL, FREET4, T3FREE, THYROIDAB in the last 72 hours. Anemia Panel: No results for input(s): VITAMINB12, FOLATE, FERRITIN, TIBC, IRON, RETICCTPCT in the last 72 hours. Sepsis Labs: Recent Labs  Lab 04/11/20 1030 04/11/20 1244  LATICACIDVEN 1.5 1.5    Recent Results (from the past 240 hour(s))  Resp Panel by RT-PCR (Flu A&B, Covid) Nasopharyngeal Swab     Status: None   Collection Time: 04/11/20 10:45 AM   Specimen: Nasopharyngeal Swab; Nasopharyngeal(NP) swabs in vial transport medium  Result Value Ref Range Status   SARS Coronavirus 2 by RT PCR NEGATIVE NEGATIVE Final    Comment: (NOTE) SARS-CoV-2 target nucleic acids are NOT DETECTED.  The SARS-CoV-2 RNA is generally detectable in upper respiratory specimens during the acute phase of infection. The lowest concentration of SARS-CoV-2 viral copies this assay can detect is 138 copies/mL. A negative result does not preclude SARS-Cov-2 infection and should not be used as the sole basis for treatment or other patient management decisions. A negative result may occur with  improper specimen collection/handling, submission of specimen other than nasopharyngeal swab, presence of viral mutation(s)  within the areas targeted by this assay, and inadequate number of viral copies(<138 copies/mL). A negative result must be combined with clinical observations, patient history, and epidemiological information. The expected result is Negative.  Fact Sheet for Patients:  BloggerCourse.com  Fact Sheet for Healthcare Providers:  SeriousBroker.it  This test is no t yet approved or cleared by the [place] FDA and  has been authorized for detection and/or diagnosis of SARS-CoV-2 by FDA under an Emergency Use Authorization (EUA). This EUA will remain  in effect (meaning this test can be used) for the duration of the COVID-19 declaration under Section 564(b)(1) of the Act, 21 U.S.C.section 360bbb-3(b)(1), unless the authorization is terminated  or revoked sooner.       Influenza A by PCR NEGATIVE NEGATIVE Final   Influenza B by PCR NEGATIVE NEGATIVE Final    Comment: (NOTE) The Xpert Xpress SARS-CoV-2/FLU/RSV plus assay is intended as an aid in the diagnosis of influenza from Nasopharyngeal swab specimens  and should not be used as a sole basis for treatment. Nasal washings and aspirates are unacceptable for Xpert Xpress SARS-CoV-2/FLU/RSV testing.  Fact Sheet for Patients: BloggerCourse.com  Fact Sheet for Healthcare Providers: SeriousBroker.it  This test is not yet approved or cleared by the [place] FDA and has been authorized for detection and/or diagnosis of SARS-CoV-2 by FDA under an Emergency Use Authorization (EUA). This EUA will remain in effect (meaning this test can be used) for the duration of the COVID-19 declaration under Section 564(b)(1) of the Act, 21 U.S.C. section 360bbb-3(b)(1), unless the authorization is terminated or revoked.  Performed at [facility], [address], [place], [place] [address]   Culture, blood (routine x 2)     Status: None    Collection Time: 04/11/20 10:50 AM   Specimen: BLOOD  Result Value Ref Range Status   Specimen Description BLOOD LEFT ANTECUBITAL  Final   Special Requests   Final    BOTTLES DRAWN AEROBIC AND ANAEROBIC Blood Culture adequate volume   Culture   Final    NO GROWTH 5 DAYS Performed at [facility], [address] [place], [place] [address]    Report Status 04/16/2020 FINAL  Final  Culture, blood (routine x 2)     Status: None   Collection Time: 04/11/20 11:00 AM   Specimen: BLOOD RIGHT FOREARM  Result Value Ref Range Status   Specimen Description BLOOD RIGHT FOREARM  Final   Special Requests   Final    BOTTLES DRAWN AEROBIC AND ANAEROBIC Blood Culture adequate volume   Culture   Final    NO GROWTH 5 DAYS Performed at [facility], [address], [place], [place] [address]    Report Status 04/16/2020 FINAL  Final      Radiology Studies: No results found.    Scheduled Meds: . antiseptic oral rinse  15 mL Topical BID  . apixaban  5 mg Oral BID  . dronabinol  2.5 mg Oral BID AC  . memantine  10 mg Oral BID  . neomycin-bacitracin-polymyxin   Topical BID  . senna-docusate  1 tablet Oral BID   Continuous Infusions:    LOS: 6 days    Joycelyn Das, MD [facility] 04/17/2020,

## 2020-04-17 NOTE — TOC Progression Note (Signed)
Transition of Care [facility]) - Progression Note    Patient Details  Name: Carolyn Bishop MRN: [id number] Date of Birth: 1932/06/02  Transition of Care [facility]) CM/SW Contact  Okey Dupre Lazaro Arms, LCSW Phone Number: 04/17/2020, 6:00 PM  Clinical Narrative:   CSW received call from Roda Shutters with [facility] and was informed that patient can discharge to on Friday, 12/10.  CSW will facilitate discharge to [facility] tomorrow.        Expected Discharge Plan and Services           Expected Discharge Date: 04/14/20  - 04/18/20                                    Social Determinants of Health (SDOH) Interventions    Readmission Risk Interventions Readmission Risk Prevention Plan 03/13/2020  Transportation Screening Complete  PCP or Specialist Appt within 5-7 Days Complete  Home Care Screening Complete  Medication Review (RN CM) Complete  Some recent data might be hidden

## 2020-04-17 NOTE — Progress Notes (Signed)
Palliative Medicine RN Note: Symptom check.  Pt is sleeping on my arrival. She denies pain, but complains of dry mouth. Performed mouth care. She accepted 3 sips of ice water but did not want any more. She answered many questions my closing her eyes and opening her mouth, as though I were going to check her teeth.   She is pale, and her skin is dry.  Margret Chance Darlyne Schmiesing, RN, BSN, [facility] Palliative Medicine Team 04/17/2020 11:44 AM Office [phone]

## 2020-04-17 NOTE — Progress Notes (Signed)
Civil engineer, contracting [facility]) Hospital Liaison Note:  Spoke with patient niece to confirm family interest in [facility] and offer a bed for transfer in the morning.   Family agreeable to transfer tomorrow and will plan to do [facility] consents in the morning with our [facility] SW.  Will update Epic once the [place] place Registration paper work is complete and patient is ready for transport.    Roda Shutters, RN [facility] Liaison  [phone] [place]ias[facility] are on AMION

## 2020-04-18 LAB — RESP PANEL BY RT-PCR (FLU A&B, COVID) ARPGX2
Influenza A by PCR: NEGATIVE
Influenza B by PCR: NEGATIVE
SARS Coronavirus 2 by RT PCR: NEGATIVE

## 2020-04-18 MED ORDER — MORPHINE SULFATE (CONCENTRATE) 10 MG/0.5ML PO SOLN: 10.0000 mg | ORAL | Status: DC | PRN

## 2020-04-18 MED ORDER — LORAZEPAM 2 MG/ML PO CONC: 1.0000 mg | ORAL | 0 refills | Status: DC | PRN

## 2020-04-18 NOTE — Progress Notes (Addendum)
Civil engineer, contracting [facility])  [facility] has a bed for Carolyn Bishop today.  TOC and family aware.  Necessary consents to be completed at 1 pm.  After this, transport can be arranged and this writer will update TOC to make those arrangements.  Report can be called to [phone], room is assigned at this time.   Please fax d/c summary to [phone].  Wallis Bamberg RN, BSN, CCRN [facility] Liaison   **all necessary consents are completed and transportation can be arranged.

## 2020-04-18 NOTE — TOC Transition Note (Addendum)
Transition of Care [facility]) - CM/SW Discharge Note [facility] * Number for Report: [phone]   Patient Details  Name: Carolyn Bishop MRN: [id number] Date of Birth: 1932/08/17  Transition of Care [facility]) CM/SW Contact:  Cristobal Goldmann, LCSW Phone Number: 04/18/2020, 12:39 PM   Clinical Narrative:  Patient discharging to [facility]) today for end of life care. Family has completed paperwork at [facility]. Discharge summary transmitted to facility and nurse given information to call report and ambulance transport will be arranged. Family aware of discharge as admissions paperwork completed today at [facility].  3:59 pm: Call made to daughter Dorothy Puffer [phone]) and HIPPA compliant voice mail left asking daughter to call CSW.   Final next level of care: Hospice Medical Facility ([facility]) Barriers to Discharge: Barriers Resolved   Patient Goals and CMS Choice Patient states their goals for this hospitalization and ongoing recovery are:: Family agreeable to hospice placement CMS Medicare.gov Compare Post Acute Care list provided to::  (Not needed - family requested [facility]) Choice offered to / list presented to : NA  Discharge Placement              Patient chooses bed at:  ([facility]) Patient to be transferred to facility by: Non-emergency ambulance transport Name of family member notified: Family aware of discharge - completed paperwork today at [facility] Patient and family notified of of transfer: 04/18/20 (Advised that ambulance transport arranged)  Discharge Plan and Services                                     Social Determinants of Health (SDOH) Interventions  No SDOH interventions requested or needed at discharge.   Readmission Risk Interventions Readmission Risk Prevention Plan 03/13/2020  Transportation Screening Complete  PCP or Specialist Appt within  5-7 Days Complete  Home Care Screening Complete  Medication Review (RN CM) Complete  Some recent data might be hidden

## 2020-04-18 NOTE — Discharge Summary (Signed)
Physician Discharge Summary  Carolyn Bishop ZOX:[id number] DOB: 01-11-1933 DOA: 04/11/2020  PCP: Sharon Seller, NP  Admit date: 04/11/2020 Discharge date: 04/18/2020  Admitted From: Home   Disposition:  Residential Hospice facility   Recommendations for Outpatient Follow-up:  1. Comfort care.  2. Ok to keep foley for comfort   Discharge Condition: stable.   CODE STATUS: DNR  Diet recommendation: Comfort Diet.   Brief/Interim Summary: 86 year old with past medical history significant for advanced dementia, hypertension, hyperlipidemia, recent diagnosis of right thigh DVT on Eliquis presented to hospital with decreased oral intake, worsening confusion.  Patient baseline has dementia and significant confusion and was initially unable to provide any history.  Patient's niece has been her only caregiver, who reported that patient has had frequent confusion, since her last hospitalization 4 weeks ago.  Her oral intake has decreased significantly over 2 weeks presentation. Three days prior to admission, tonight he stopped eating and drinking altogether and was more sleepy. Palliative care was consulted for goals of care, Family decided to proceed with hospice care  Discharge Diagnoses:  Principal Problem:   Failure to thrive in adult Active Problems:   Essential hypertension   DVT (deep venous thrombosis) (HCC)   Dehydration   Dementia without behavioral disturbance (HCC)  Failure to thrive secondary to advanced dementia. Palliative care on board and recommend comfort care residential hospice.  Continue Ensure, Marinol.  Blood cultures initial were negative.  CT head was unremarkable.  Patient continues to have poor oral intake.  Awaiting for residential hospice care.  Right lower extremity DVT:  On eliquis  Hypertension: was on amlodipine.  advanced dementia: Supportive care, hospice care  Pressure Injury 03/10/20 Sacrum Medial Stage 2 -  Partial thickness loss of dermis  presenting as a shallow open injury with a red, pink wound bed without slough. (Active)  03/10/20 1734  Location: Sacrum  Location Orientation: Medial  Staging: Stage 2 -  Partial thickness loss of dermis presenting as a shallow open injury with a red, pink wound bed without slough.  Wound Description (Comments):   Present on Admission: Yes    Discharge Instructions  Discharge Instructions    Diet - low sodium heart healthy   Complete by: As directed    Increase activity slowly   Complete by: As directed      Allergies as of 04/18/2020   No Known Allergies     Medication List    STOP taking these medications   amLODipine 5 MG tablet Commonly known as: NORVASC   cyanocobalamin 1000 MCG/ML injection Commonly known as: (VITAMIN B-12)   feeding supplement Liqd   multivitamin with minerals Tabs tablet     TAKE these medications   acetaminophen 500 MG tablet Commonly known as: TYLENOL Take 1,000 mg by mouth every 6 (six) hours as needed for mild pain or headache.   apixaban 5 MG Tabs tablet Commonly known as: ELIQUIS Take 1 tablet (5 mg total) by mouth 2 (two) times daily. First dose on Mon 03/17/20 at 2200   dronabinol 2.5 MG capsule Commonly known as: MARINOL Take 1 capsule (2.5 mg total) by mouth 2 (two) times daily before lunch and supper.   glycopyrrolate 1 MG tablet Commonly known as: ROBINUL Take 1 tablet (1 mg total) by mouth every 4 (four) hours as needed (excessive secretions).   LORazepam 2 MG/ML concentrated solution Commonly known as: ATIVAN Take 0.5 mLs (1 mg total) by mouth every 4 (four) hours as needed for anxiety.   memantine  10 MG tablet Commonly known as: NAMENDA Take 10 mg by mouth 2 (two) times daily.   morphine CONCENTRATE 10 MG/0.5ML Soln concentrated solution Place 0.5 mLs (10 mg total) under the tongue every hour as needed for moderate pain, severe pain or shortness of breath (pain or SOB; if IV is lost).   ondansetron 4 MG  disintegrating tablet Commonly known as: ZOFRAN-ODT Take 1 tablet (4 mg total) by mouth every 6 (six) hours as needed for nausea.   senna-docusate 8.6-50 MG tablet Commonly known as: Senokot-S Take 1 tablet by mouth 2 (two) times daily.       No Known Allergies  Consultations:  Palliative care   Procedures/Studies: CT Head Wo Contrast  Result Date: 04/11/2020 CLINICAL DATA:  Delirium EXAM: CT HEAD WITHOUT CONTRAST TECHNIQUE: Contiguous axial images were obtained from the base of the skull through the vertex without intravenous contrast. COMPARISON:  03/24/2020 FINDINGS: Brain: There is no acute intracranial hemorrhage, mass effect, or edema. Gray-white differentiation is preserved. There is no extra-axial fluid collection. Prominence of the ventricles and sulci reflects stable parenchymal volume loss. Patchy and confluent areas of hypoattenuation in the supratentorial white matter are nonspecific but probably reflect stable chronic microvascular ischemic changes. Vascular: There is atherosclerotic calcification at the skull base. Skull: Calvarium is unremarkable. Sinuses/Orbits: Chronic right sphenoid sinusitis. Other: None. IMPRESSION: No acute intracranial abnormality. Stable chronic/nonemergent findings detailed above. Electronically Signed   By: Guadlupe Spanish M.D.   On: 04/11/2020 12:26   CT Head Wo Contrast  Result Date: 03/24/2020 CLINICAL DATA:  Unwitnessed fall EXAM: CT HEAD WITHOUT CONTRAST TECHNIQUE: Contiguous axial images were obtained from the base of the skull through the vertex without intravenous contrast. COMPARISON:  03/11/2020 FINDINGS: Brain: No evidence of acute infarction, hemorrhage, hydrocephalus, extra-axial collection or mass lesion/mass effect. Moderate low-density changes within the periventricular and subcortical white matter compatible with chronic microvascular ischemic change. Moderate diffuse cerebral volume loss. Vascular: Atherosclerotic calcifications  involving the large vessels of the skull base. No unexpected hyperdense vessel. Skull: Normal. Negative for fracture or focal lesion. Sinuses/Orbits: Chronic mucosal thickening within the right sphenoid sinus. Remaining paranasal sinuses and mastoid air cells are clear. Other: None. IMPRESSION: 1. No acute intracranial findings. 2. Chronic microvascular ischemic change and cerebral volume loss. Electronically Signed   By: Duanne Guess D.O.   On: 03/24/2020 10:44   DG Pelvis Portable  Result Date: 04/11/2020 CLINICAL DATA:  Altered mental status. EXAM: PORTABLE PELVIS 1-2 VIEWS COMPARISON:  Left hip series 12/23/2019. FINDINGS: Tubing noted over the abdomen and pelvis. Very large amount of stool noted throughout the colon rectum consistent with constipation and possible fecal impaction. Pelvic calcifications consistent phleboliths. Peripheral vascular calcification. Diffuse osteopenia. Degenerative changes scoliosis lumbar spine. Degenerative changes both hips. No acute bony abnormality identified. No evidence of fracture dislocation. IMPRESSION: 1. Very large amount of stool noted throughout the colon and rectum consistent with constipation and possible fecal impaction. 2. Diffuse osteopenia. Degenerative changes scoliosis lumbar spine. Degenerative changes both hips. No acute bony abnormality. 3.  Peripheral vascular disease. Electronically Signed   By: Maisie Fus  Register   On: 04/11/2020 11:33   DG Chest Port 1 View  Result Date: 04/11/2020 CLINICAL DATA:  ams EXAM: PORTABLE CHEST 1 VIEW COMPARISON:  03/10/2020 chest radiograph and prior. FINDINGS: No focal consolidation, pneumothorax or pleural effusion. Cardiomediastinal silhouette within normal limits. No acute osseous abnormality. IMPRESSION: No acute airspace disease. Electronically Signed   By: Stana Bunting M.D.   On: 04/11/2020 11:30  DG Knee Left Port  Result Date: 04/11/2020 CLINICAL DATA:  Left knee bruising after fall. EXAM:  PORTABLE LEFT KNEE - 1-2 VIEW COMPARISON:  None. FINDINGS: No evidence of fracture, dislocation, or joint effusion. Mild degenerative changes seen involving the medial and lateral joint spaces with osteophyte formation. Soft tissues are unremarkable. IMPRESSION: Mild degenerative joint disease. No acute abnormality seen in the left knee. Electronically Signed   By: Lupita RaiderJames  Green Jr M.D.   On: 04/11/2020 11:34    Subjective: Patient was seen and examined at bedside.  Denies any overt pain, nausea, vomiting, fever or chills  Discharge Exam: Vitals:   04/17/20 0927 04/17/20 2054  BP: (!) 144/85 140/85  Pulse: 83 84  Resp: 18 18  Temp: 99.4 F (37.4 C) 98 F (36.7 C)  SpO2: 100% 100%   General: Thinly built,, not in obvious distress, communicative, underlying dementia HENT:   No scleral pallor or icterus noted. Oral mucosa is moist.  Chest:    Diminished breath sounds bilaterally. No crackles or wheezes.  CVS: S1 &S2 heard. No murmur.  Regular rate and rhythm. Abdomen: Soft, nontender, nondistended.  Bowel sounds are heard.   Extremities: No cyanosis, clubbing or edema.  Peripheral pulses are palpable. Psych: Alert, awake and communicative, underlying dementia CNS:  No cranial nerve deficits.  Moving the extremities Skin: Warm and dry. stage II sacral decubitus ulceration  The results of significant diagnostics from this hospitalization (including imaging, microbiology, ancillary and laboratory) are listed below for reference.     Microbiology: Recent Results (from the past 240 hour(s))  Resp Panel by RT-PCR (Flu A&B, Covid) Nasopharyngeal Swab     Status: None   Collection Time: 04/11/20 10:45 AM   Specimen: Nasopharyngeal Swab; Nasopharyngeal(NP) swabs in vial transport medium  Result Value Ref Range Status   SARS Coronavirus 2 by RT PCR NEGATIVE NEGATIVE Final    Comment: (NOTE) SARS-CoV-2 target nucleic acids are NOT DETECTED.  The SARS-CoV-2 RNA is generally detectable in  upper respiratory specimens during the acute phase of infection. The lowest concentration of SARS-CoV-2 viral copies this assay can detect is 138 copies/mL. A negative result does not preclude SARS-Cov-2 infection and should not be used as the sole basis for treatment or other patient management decisions. A negative result may occur with  improper specimen collection/handling, submission of specimen other than nasopharyngeal swab, presence of viral mutation(s) within the areas targeted by this assay, and inadequate number of viral copies(<138 copies/mL). A negative result must be combined with clinical observations, patient history, and epidemiological information. The expected result is Negative.  Fact Sheet for Patients:  BloggerCourse.comhttps://www.fda.gov/media/152166/download  Fact Sheet for Healthcare Providers:  SeriousBroker.ithttps://www.fda.gov/media/152162/download  This test is no t yet approved or cleared by the [place] FDA and  has been authorized for detection and/or diagnosis of SARS-CoV-2 by FDA under an Emergency Use Authorization (EUA). This EUA will remain  in effect (meaning this test can be used) for the duration of the COVID-19 declaration under Section 564(b)(1) of the Act, 21 U.S.C.section 360bbb-3(b)(1), unless the authorization is terminated  or revoked sooner.       Influenza A by PCR NEGATIVE NEGATIVE Final   Influenza B by PCR NEGATIVE NEGATIVE Final    Comment: (NOTE) The Xpert Xpress SARS-CoV-2/FLU/RSV plus assay is intended as an aid in the diagnosis of influenza from Nasopharyngeal swab specimens and should not be used as a sole basis for treatment. Nasal washings and aspirates are unacceptable for Xpert Xpress SARS-CoV-2/FLU/RSV testing.  Fact Sheet for Patients: BloggerCourse.com  Fact Sheet for Healthcare Providers: SeriousBroker.it  This test is not yet approved or cleared by the [place] FDA and has been  authorized for detection and/or diagnosis of SARS-CoV-2 by FDA under an Emergency Use Authorization (EUA). This EUA will remain in effect (meaning this test can be used) for the duration of the COVID-19 declaration under Section 564(b)(1) of the Act, 21 U.S.C. section 360bbb-3(b)(1), unless the authorization is terminated or revoked.  Performed at [facility], [address] [place], [place] [address]   Culture, blood (routine x 2)     Status: None   Collection Time: 04/11/20 10:50 AM   Specimen: BLOOD  Result Value Ref Range Status   Specimen Description BLOOD LEFT ANTECUBITAL  Final   Special Requests   Final    BOTTLES DRAWN AEROBIC AND ANAEROBIC Blood Culture adequate volume   Culture   Final    NO GROWTH 5 DAYS Performed at [facility], [address], [place], [place] [address]    Report Status 04/16/2020 FINAL  Final  Culture, blood (routine x 2)     Status: None   Collection Time: 04/11/20 11:00 AM   Specimen: BLOOD RIGHT FOREARM  Result Value Ref Range Status   Specimen Description BLOOD RIGHT FOREARM  Final   Special Requests   Final    BOTTLES DRAWN AEROBIC AND ANAEROBIC Blood Culture adequate volume   Culture   Final    NO GROWTH 5 DAYS Performed at [facility], [address] [place], [place] [address]    Report Status 04/16/2020 FINAL  Final     Labs: BNP (last 3 results) No results for input(s): BNP in the last 8760 hours. Basic Metabolic Panel: Recent Labs  Lab 04/11/20 1030  NA 144  K 3.8  CL 106  CO2 22  GLUCOSE 190*  BUN 25*  CREATININE 1.02*  CALCIUM 9.9   Liver Function Tests: Recent Labs  Lab 04/11/20 1030  AST 20  ALT 11  ALKPHOS 62  BILITOT 0.9  PROT 7.6  ALBUMIN 3.2*   Recent Labs  Lab 04/11/20 1030  LIPASE 48   No results for input(s): AMMONIA in the last 168 hours. CBC: Recent Labs  Lab 04/11/20 1030  WBC 6.6  NEUTROABS 5.5  HGB 12.4  HCT 37.5  MCV 91.2  PLT 352   Cardiac Enzymes: No  results for input(s): CKTOTAL, CKMB, CKMBINDEX, TROPONINI in the last 168 hours. BNP: Invalid input(s): POCBNP CBG: No results for input(s): GLUCAP in the last 168 hours. D-Dimer No results for input(s): DDIMER in the last 72 hours. Hgb A1c No results for input(s): HGBA1C in the last 72 hours. Lipid Profile No results for input(s): CHOL, HDL, LDLCALC, TRIG, CHOLHDL, LDLDIRECT in the last 72 hours. Thyroid function studies No results for input(s): TSH, T4TOTAL, T3FREE, THYROIDAB in the last 72 hours.  Invalid input(s): FREET3 Anemia work up No results for input(s): VITAMINB12, FOLATE, FERRITIN, TIBC, IRON, RETICCTPCT in the last 72 hours. Urinalysis    Component Value Date/Time   COLORURINE YELLOW 03/24/2020 1350   APPEARANCEUR HAZY (A) 03/24/2020 1350   LABSPEC 1.019 03/24/2020 1350   PHURINE 5.0 03/24/2020 1350   GLUCOSEU NEGATIVE 03/24/2020 1350   HGBUR NEGATIVE 03/24/2020 1350   BILIRUBINUR NEGATIVE 03/24/2020 1350   KETONESUR NEGATIVE 03/24/2020 1350   PROTEINUR 100 (A) 03/24/2020 1350   UROBILINOGEN 0.2 02/21/2015 2355   NITRITE NEGATIVE 03/24/2020 1350   LEUKOCYTESUR  NEGATIVE 03/24/2020 1350   Sepsis Labs Invalid input(s): PROCALCITONIN,  WBC,  LACTICIDVEN Microbiology Recent Results (from the past 240 hour(s))  Resp Panel by RT-PCR (Flu A&B, Covid) Nasopharyngeal Swab     Status: None   Collection Time: 04/11/20 10:45 AM   Specimen: Nasopharyngeal Swab; Nasopharyngeal(NP) swabs in vial transport medium  Result Value Ref Range Status   SARS Coronavirus 2 by RT PCR NEGATIVE NEGATIVE Final    Comment: (NOTE) SARS-CoV-2 target nucleic acids are NOT DETECTED.  The SARS-CoV-2 RNA is generally detectable in upper respiratory specimens during the acute phase of infection. The lowest concentration of SARS-CoV-2 viral copies this assay can detect is 138 copies/mL. A negative result does not preclude SARS-Cov-2 infection and should not be used as the sole basis for  treatment or other patient management decisions. A negative result may occur with  improper specimen collection/handling, submission of specimen other than nasopharyngeal swab, presence of viral mutation(s) within the areas targeted by this assay, and inadequate number of viral copies(<138 copies/mL). A negative result must be combined with clinical observations, patient history, and epidemiological information. The expected result is Negative.  Fact Sheet for Patients:  BloggerCourse.com  Fact Sheet for Healthcare Providers:  SeriousBroker.it  This test is no t yet approved or cleared by the [place] FDA and  has been authorized for detection and/or diagnosis of SARS-CoV-2 by FDA under an Emergency Use Authorization (EUA). This EUA will remain  in effect (meaning this test can be used) for the duration of the COVID-19 declaration under Section 564(b)(1) of the Act, 21 U.S.C.section 360bbb-3(b)(1), unless the authorization is terminated  or revoked sooner.       Influenza A by PCR NEGATIVE NEGATIVE Final   Influenza B by PCR NEGATIVE NEGATIVE Final    Comment: (NOTE) The Xpert Xpress SARS-CoV-2/FLU/RSV plus assay is intended as an aid in the diagnosis of influenza from Nasopharyngeal swab specimens and should not be used as a sole basis for treatment. Nasal washings and aspirates are unacceptable for Xpert Xpress SARS-CoV-2/FLU/RSV testing.  Fact Sheet for Patients: BloggerCourse.com  Fact Sheet for Healthcare Providers: SeriousBroker.it  This test is not yet approved or cleared by the [place] FDA and has been authorized for detection and/or diagnosis of SARS-CoV-2 by FDA under an Emergency Use Authorization (EUA). This EUA will remain in effect (meaning this test can be used) for the duration of the COVID-19 declaration under Section 564(b)(1) of the Act, 21  U.S.C. section 360bbb-3(b)(1), unless the authorization is terminated or revoked.  Performed at [facility], [address] [place], [place] [address]   Culture, blood (routine x 2)     Status: None   Collection Time: 04/11/20 10:50 AM   Specimen: BLOOD  Result Value Ref Range Status   Specimen Description BLOOD LEFT ANTECUBITAL  Final   Special Requests   Final    BOTTLES DRAWN AEROBIC AND ANAEROBIC Blood Culture adequate volume   Culture   Final    NO GROWTH 5 DAYS Performed at [facility], [address] [place], [place] [address]    Report Status 04/16/2020 FINAL  Final  Culture, blood (routine x 2)     Status: None   Collection Time: 04/11/20 11:00 AM   Specimen: BLOOD RIGHT FOREARM  Result Value Ref Range Status   Specimen Description BLOOD RIGHT FOREARM  Final   Special Requests   Final    BOTTLES DRAWN AEROBIC AND ANAEROBIC Blood Culture adequate volume  Culture   Final    NO GROWTH 5 DAYS Performed at [facility], [address], [place], [place] [address]    Report Status 04/16/2020 FINAL  Final     Time coordinating discharge: 40 minutes  SIGNED:   Joycelyn Das, MD  [facility] 04/18/2020

## 2020-04-18 NOTE — Plan of Care (Signed)
  Problem: Nutrition: Goal: Adequate nutrition will be maintained Outcome: Progressing   

## 2020-04-18 NOTE — Progress Notes (Signed)
Palliative Medicine RN Note: Pt has a bed for BP per [facility] w [facility] ([facility]  Margret Chance Guiselle Mian, RN, BSN, [facility] Palliative Medicine Team 04/18/2020 11:11 AM Office [phone]

## 2020-04-18 NOTE — Progress Notes (Deleted)
DISCHARGE NOTE SN[facility] to be discharged Skilled nursing facility per MD order. Patient verbalized understanding.  Skin clean, dry and intact without evidence of skin break down, no evidence of skin tears noted. IV catheter discontinued intact. Site without signs and symptoms of complications. Dressing and pressure applied. Pt denies pain at the site currently. No complaints noted.  Patient DC with Foley catheter per MD order.  Discharge packet assembled. An After Visit Summary (AVS) was printed and given to the EMS personnel. Patient escorted via stretcher and discharged to [name] via ambulance. Report called to accepting facility; all questions and concerns addressed.   Leonia Reeves, RN, BSN

## 2020-04-25 ENCOUNTER — Other Ambulatory Visit: Payer: Self-pay | Admitting: Internal Medicine

## 2020-05-10 DEATH — deceased

## 2022-02-23 IMAGING — CT CT HEAD W/O CM
4 series · 17 of 47 positions shown, 19 images · non-contrast
Comparison: 12/20/2019

CLINICAL DATA: Altered mental status, head trauma

EXAM:
CT HEAD WITHOUT CONTRAST
TECHNIQUE: Contiguous axial images were obtained from the base of the skull
through the vertex without intravenous contrast.

[Series 3: head wo · axial · 0.39mm/px · z∈[+1218,+1338]mm · 7 of 32 slices shown, 9 images]
[im 4/32  brain]
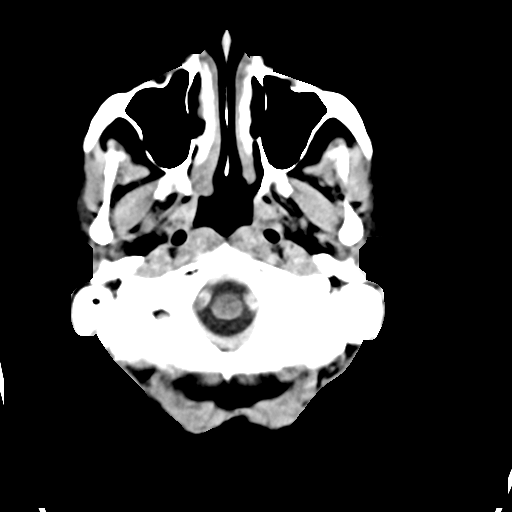
[im 4/32  bone]
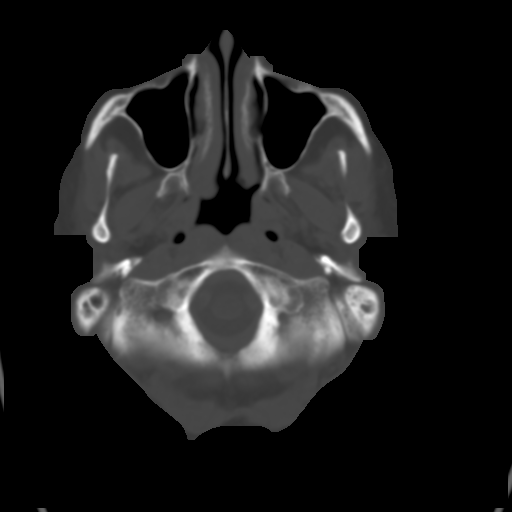
[im 8/32  brain]
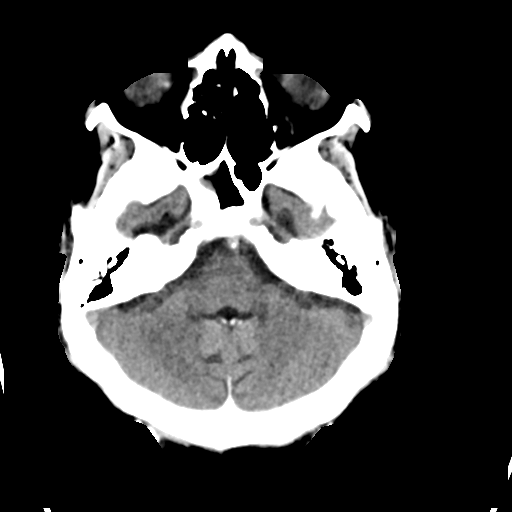
[im 12/32  brain]
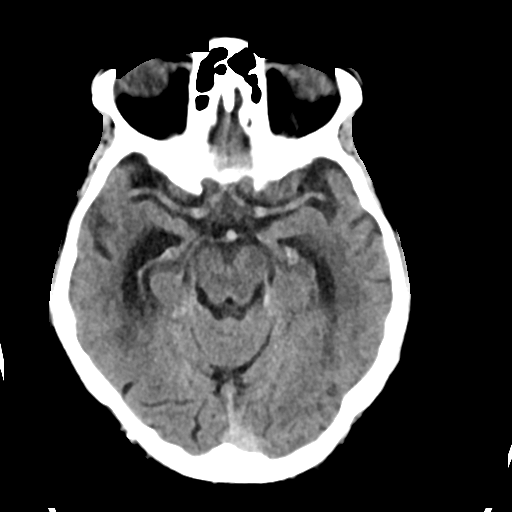
[im 16/32  brain]
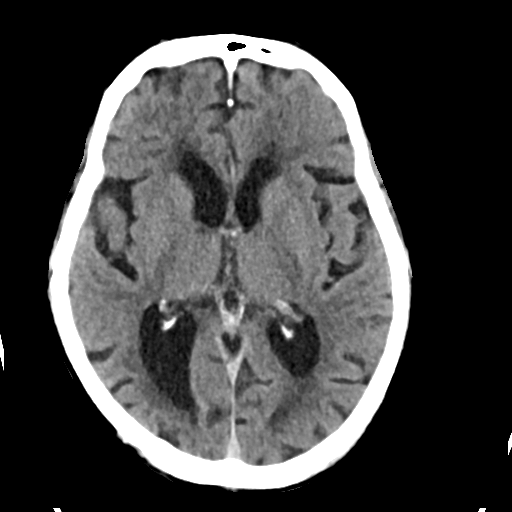
[im 20/32  brain]
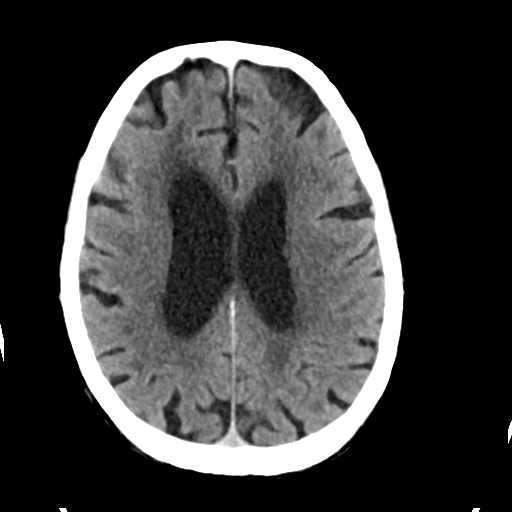
[im 20/32  bone]
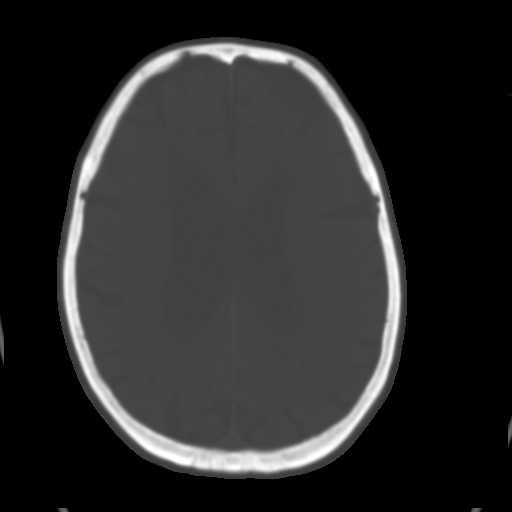
[im 24/32  brain]
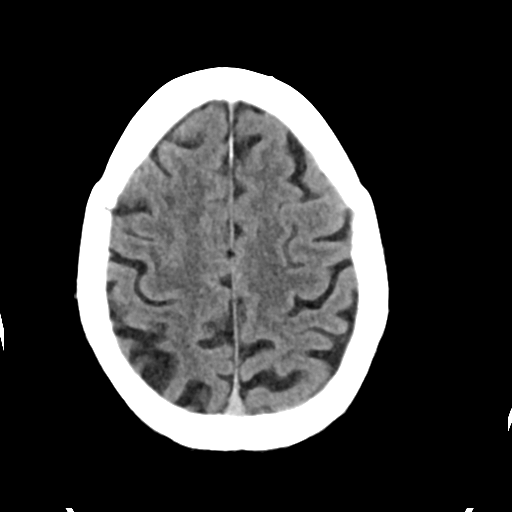
[im 28/32  brain]
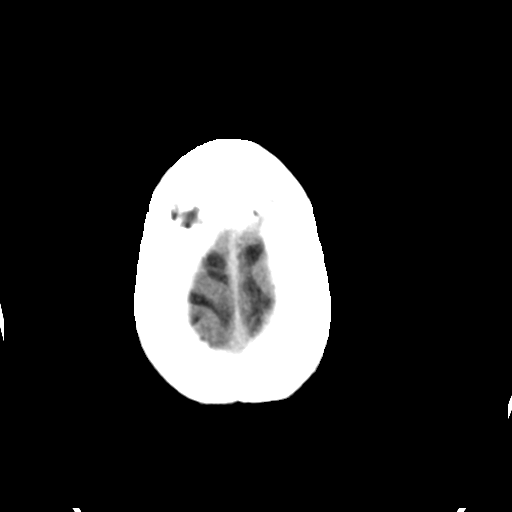

[Series 4: head bone · axial · 0.39mm/px · z∈[+1217,+1273]mm · 4 of 80 slices shown]
[im 8/80  bone]
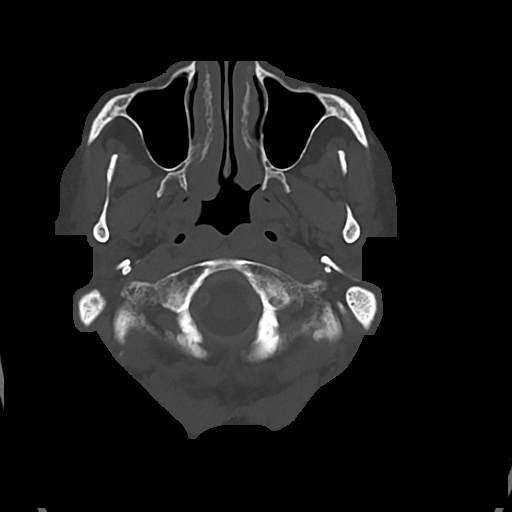
[im 16/80  bone]
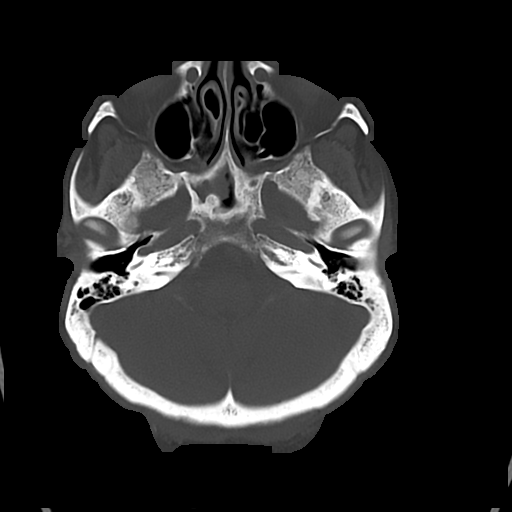
[im 24/80  bone]
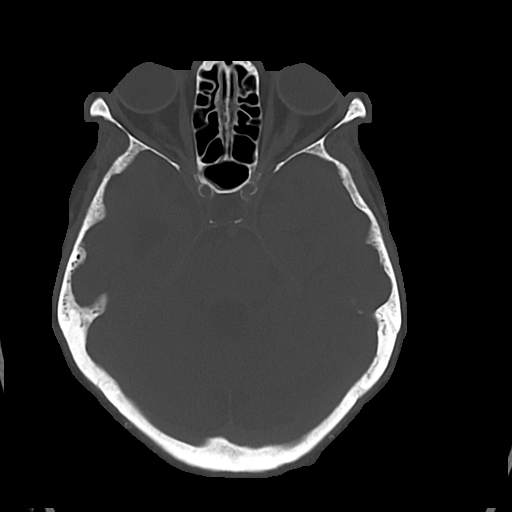
[im 36/80  bone]
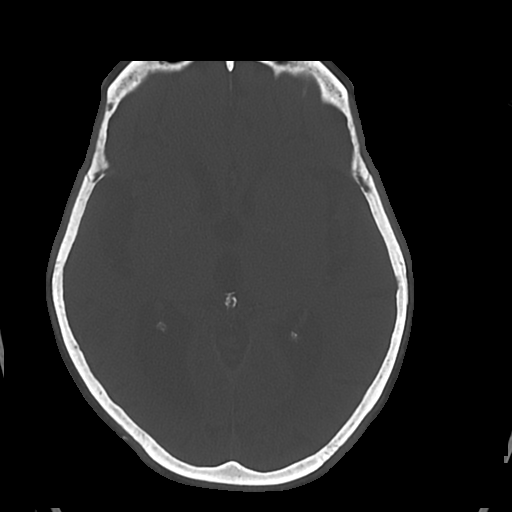

[Series 5: cor soft · coronal · 0.29mm/px · 3 of 64 slices shown]
[im 22/64  brain]
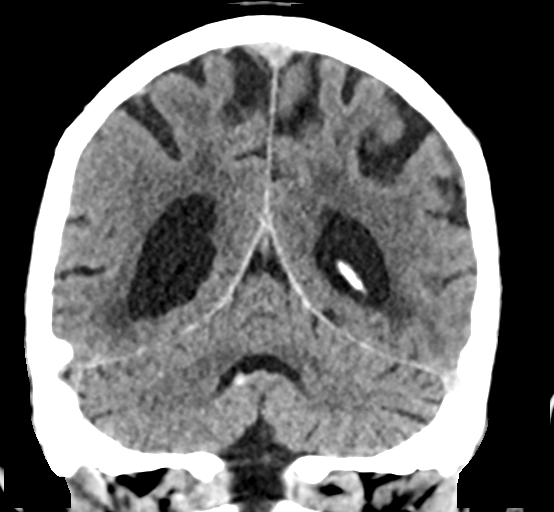
[im 29/64  brain]
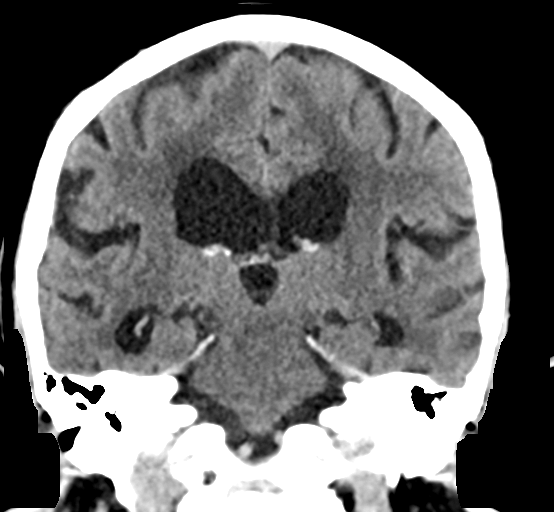
[im 36/64  brain]
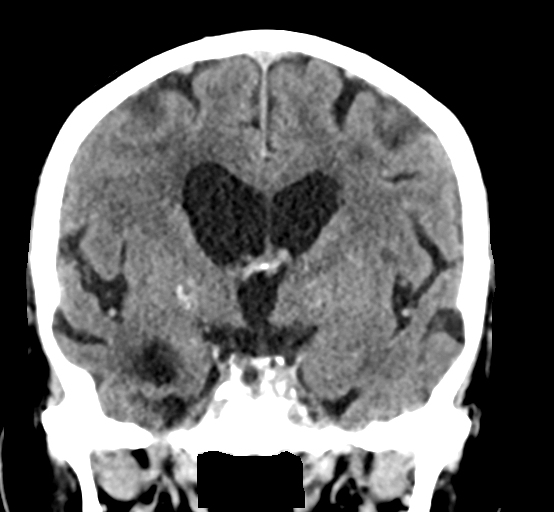

[Series 6: sag soft · sagittal · 0.29mm/px · 3 of 54 slices shown]
[im 18/54  brain]
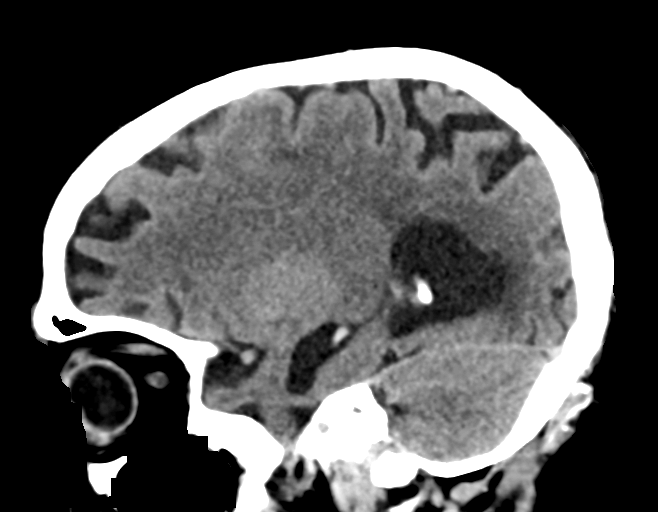
[im 27/54  brain]
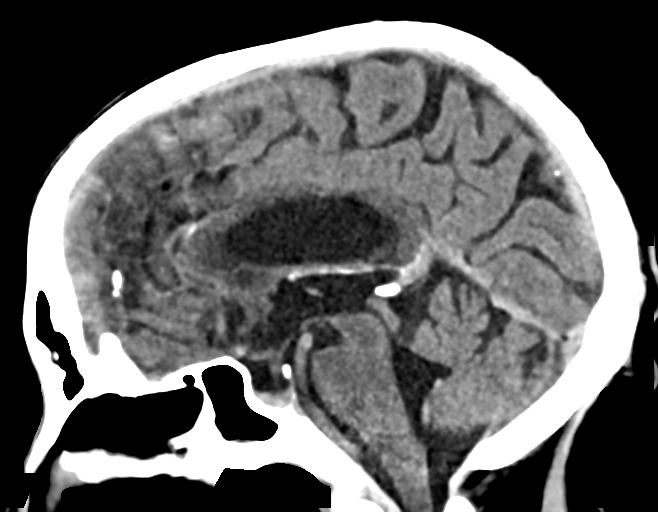
[im 36/54  brain]
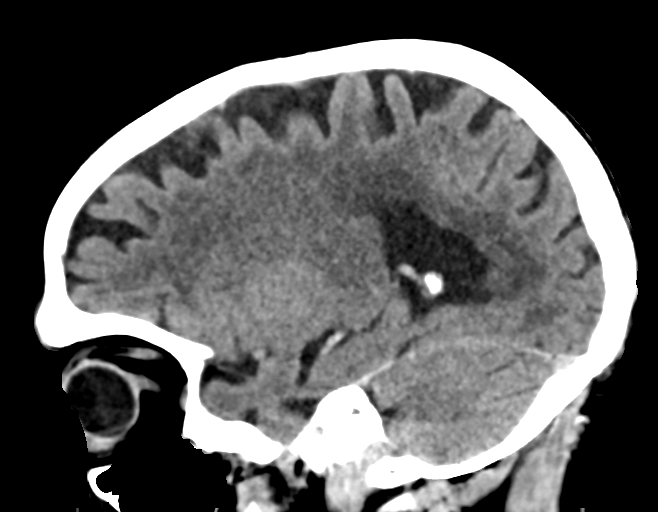

[17 of 47 positions shown; findings below may reference images not displayed]

FINDINGS: Brain: There is atrophy and chronic small vessel disease changes. No
acute intracranial abnormality. Specifically, no hemorrhage,
hydrocephalus, mass lesion, acute infarction, or significant
intracranial injury.

Vascular: No hyperdense vessel or unexpected calcification.

Skull: No acute calvarial abnormality.

Sinuses/Orbits: Visualized paranasal sinuses and mastoids clear.
Orbital soft tissues unremarkable.

Other: None
IMPRESSION: Atrophy, chronic microvascular disease.

No acute intracranial abnormality.
